# Patient Record
Sex: Female | Born: 1955 | Race: White | Hispanic: No | Marital: Married | Smoking: Never smoker
Health system: Southern US, Community
[De-identification: ages and names within clinical notes are randomized; demographics above are authoritative.]

## PROBLEM LIST (undated history)

## (undated) DIAGNOSIS — Z9889 Other specified postprocedural states: Secondary | ICD-10-CM

## (undated) DIAGNOSIS — C50919 Malignant neoplasm of unspecified site of unspecified female breast: Secondary | ICD-10-CM

## (undated) DIAGNOSIS — K219 Gastro-esophageal reflux disease without esophagitis: Secondary | ICD-10-CM

## (undated) DIAGNOSIS — Z8489 Family history of other specified conditions: Secondary | ICD-10-CM

## (undated) DIAGNOSIS — Z973 Presence of spectacles and contact lenses: Secondary | ICD-10-CM

## (undated) DIAGNOSIS — J45909 Unspecified asthma, uncomplicated: Secondary | ICD-10-CM

## (undated) DIAGNOSIS — S82899A Other fracture of unspecified lower leg, initial encounter for closed fracture: Secondary | ICD-10-CM

## (undated) DIAGNOSIS — R112 Nausea with vomiting, unspecified: Secondary | ICD-10-CM

## (undated) DIAGNOSIS — T7840XA Allergy, unspecified, initial encounter: Secondary | ICD-10-CM

## (undated) DIAGNOSIS — M1990 Unspecified osteoarthritis, unspecified site: Secondary | ICD-10-CM

## (undated) DIAGNOSIS — M810 Age-related osteoporosis without current pathological fracture: Secondary | ICD-10-CM

## (undated) DIAGNOSIS — L0390 Cellulitis, unspecified: Secondary | ICD-10-CM

## (undated) HISTORY — DX: Age-related osteoporosis without current pathological fracture: M81.0

## (undated) HISTORY — PX: TONSILLECTOMY: SUR1361

## (undated) HISTORY — PX: ABDOMINAL HYSTERECTOMY: SHX81

## (undated) HISTORY — DX: Malignant neoplasm of unspecified site of unspecified female breast: C50.919

## (undated) HISTORY — DX: Unspecified asthma, uncomplicated: J45.909

## (undated) HISTORY — PX: TUBAL LIGATION: SHX77

## (undated) HISTORY — DX: Allergy, unspecified, initial encounter: T78.40XA

## (undated) HISTORY — PX: ROTATOR CUFF REPAIR: SHX139

---

## 1997-12-05 ENCOUNTER — Other Ambulatory Visit: Admission: RE | Admit: 1997-12-05 | Discharge: 1997-12-05 | Payer: Self-pay | Admitting: Obstetrics and Gynecology

## 1999-01-23 ENCOUNTER — Other Ambulatory Visit: Admission: RE | Admit: 1999-01-23 | Discharge: 1999-01-23 | Payer: Self-pay | Admitting: Obstetrics and Gynecology

## 1999-04-16 ENCOUNTER — Other Ambulatory Visit: Admission: RE | Admit: 1999-04-16 | Discharge: 1999-04-16 | Payer: Self-pay | Admitting: Obstetrics and Gynecology

## 2000-11-03 ENCOUNTER — Other Ambulatory Visit: Admission: RE | Admit: 2000-11-03 | Discharge: 2000-11-03 | Payer: Self-pay | Admitting: Obstetrics and Gynecology

## 2002-11-17 ENCOUNTER — Ambulatory Visit (HOSPITAL_COMMUNITY): Admission: RE | Admit: 2002-11-17 | Discharge: 2002-11-17 | Payer: Self-pay | Admitting: Obstetrics and Gynecology

## 2002-11-17 ENCOUNTER — Encounter: Payer: Self-pay | Admitting: Obstetrics and Gynecology

## 2003-12-14 ENCOUNTER — Other Ambulatory Visit: Admission: RE | Admit: 2003-12-14 | Discharge: 2003-12-14 | Payer: Self-pay | Admitting: Obstetrics and Gynecology

## 2004-05-12 HISTORY — PX: COLONOSCOPY: SHX174

## 2004-06-12 ENCOUNTER — Ambulatory Visit (HOSPITAL_COMMUNITY): Admission: RE | Admit: 2004-06-12 | Discharge: 2004-06-12 | Payer: Self-pay | Admitting: Gastroenterology

## 2005-02-05 ENCOUNTER — Other Ambulatory Visit: Admission: RE | Admit: 2005-02-05 | Discharge: 2005-02-05 | Payer: Self-pay | Admitting: Obstetrics and Gynecology

## 2006-04-09 ENCOUNTER — Other Ambulatory Visit: Admission: RE | Admit: 2006-04-09 | Discharge: 2006-04-09 | Payer: Self-pay | Admitting: Obstetrics and Gynecology

## 2006-05-21 ENCOUNTER — Ambulatory Visit: Payer: Self-pay | Admitting: Oncology

## 2006-06-08 LAB — CBC & DIFF AND RETIC
BASO%: 1.1 % (ref 0.0–2.0)
Basophils Absolute: 0.1 10*3/uL (ref 0.0–0.1)
EOS%: 2.1 % (ref 0.0–7.0)
Eosinophils Absolute: 0.1 10*3/uL (ref 0.0–0.5)
HCT: 34.3 % — ABNORMAL LOW (ref 34.8–46.6)
HGB: 12.1 g/dL (ref 11.6–15.9)
IRF: 0.32 (ref 0.130–0.330)
RBC: 3.87 10*6/uL (ref 3.70–5.32)
Retic %: 1.8 % (ref 0.4–2.3)

## 2006-06-08 LAB — MORPHOLOGY

## 2006-06-08 LAB — URINALYSIS, MICROSCOPIC - CHCC
Bilirubin (Urine): NEGATIVE
Nitrite: NEGATIVE
Specific Gravity, Urine: 1.015 (ref 1.003–1.035)
WBC, UA: NEGATIVE (ref 0–2)
pH: 5 (ref 4.6–8.0)

## 2006-06-16 LAB — LUPUS ANTICOAGULANT PANEL: DRVVT: 45.8 secs — ABNORMAL HIGH (ref 31.9–44.2)

## 2006-06-16 LAB — CARDIOLIPIN ANTIBODIES, IGG, IGM, IGA: Anticardiolipin IgA: <7 [APL'U] (ref <13)

## 2006-06-16 LAB — BETA-2 GLYCOPROTEIN ANTIBODIES
Beta-2 Glyco I IgG: <4 U/mL (ref <20)
Beta-2-Glycoprotein I IgA: <4 U/mL (ref <10)
Beta-2-Glycoprotein I IgM: 13 U/mL (ref <10)

## 2006-06-16 LAB — LACTATE DEHYDROGENASE: LDH: 181 U/L (ref 94–250)

## 2006-06-16 LAB — IRON AND TIBC: TIBC: 335 ug/dL (ref 250–470)

## 2006-06-16 LAB — ANA: Anti Nuclear Antibody(ANA): NEGATIVE

## 2006-06-16 LAB — SEDIMENTATION RATE: Sed Rate: 12 mm/hr (ref 0–22)

## 2007-03-24 ENCOUNTER — Ambulatory Visit (HOSPITAL_BASED_OUTPATIENT_CLINIC_OR_DEPARTMENT_OTHER): Admission: RE | Admit: 2007-03-24 | Discharge: 2007-03-24 | Payer: Self-pay | Admitting: Orthopedic Surgery

## 2007-05-11 ENCOUNTER — Other Ambulatory Visit: Admission: RE | Admit: 2007-05-11 | Discharge: 2007-05-11 | Payer: Self-pay | Admitting: Obstetrics and Gynecology

## 2008-05-15 ENCOUNTER — Other Ambulatory Visit: Admission: RE | Admit: 2008-05-15 | Discharge: 2008-05-15 | Payer: Self-pay | Admitting: Obstetrics and Gynecology

## 2009-05-16 ENCOUNTER — Other Ambulatory Visit
Admission: RE | Admit: 2009-05-16 | Discharge: 2009-05-16 | Payer: Self-pay | Source: Home / Self Care | Admitting: Obstetrics and Gynecology

## 2010-04-25 ENCOUNTER — Ambulatory Visit (HOSPITAL_COMMUNITY)
Admission: RE | Admit: 2010-04-25 | Discharge: 2010-04-25 | Payer: Self-pay | Source: Home / Self Care | Attending: Surgery | Admitting: Surgery

## 2010-07-23 LAB — CBC
HCT: 38 % (ref 36.0–46.0)
Hemoglobin: 12.9 g/dL (ref 12.0–15.0)
MCHC: 33.9 g/dL (ref 30.0–36.0)
MCV: 88.2 fL (ref 78.0–100.0)
Platelets: 276 10*3/uL (ref 150–400)

## 2010-07-23 LAB — DIFFERENTIAL
Eosinophils Absolute: 0.1 10*3/uL (ref 0.0–0.7)
Eosinophils Relative: 2 % (ref 0–5)
Lymphs Abs: 1.6 10*3/uL (ref 0.7–4.0)
Monocytes Relative: 8 % (ref 3–12)

## 2010-07-23 LAB — COMPREHENSIVE METABOLIC PANEL
AST: 25 U/L (ref 0–37)
Albumin: 4.2 g/dL (ref 3.5–5.2)
Alkaline Phosphatase: 66 U/L (ref 39–117)
BUN: 11 mg/dL (ref 6–23)
Chloride: 104 mEq/L (ref 96–112)
Total Protein: 6.7 g/dL (ref 6.0–8.3)

## 2010-07-23 LAB — SURGICAL PCR SCREEN
MRSA, PCR: NEGATIVE
Staphylococcus aureus: NEGATIVE

## 2010-09-24 NOTE — Op Note (Signed)
Madison Bryant, Madison Bryant             ACCOUNT NO.:  [id number]   MEDICAL RECORD NO.:  [id number]          PATIENT TYPE:  AMB   LOCATION:  DSC                          FACILITY:  [facility]   PHYSICIAN:  Loreta Ave, M.D. DATE OF BIRTH:  07/09/1955   DATE OF PROCEDURE:  03/22/2007  DATE OF DISCHARGE:                               OPERATIVE REPORT   PREOPERATIVE DIAGNOSIS:  1. Right shoulder impingement distal clavicle osteolysis.  2. Full thickness tear rotator cuff.   POSTOPERATIVE DIAGNOSIS:  1. Right shoulder impingement distal clavicle osteolysis.  2. Full thickness tear rotator cuff.  3. Circumferential labral tearing, mostly anterior labrum.   PROCEDURE:  Right shoulder exam under anesthesia, arthroscopy with  debridement of labrum and rotator cuff.  Acromioplasty CA ligament  release.  Excision distal clavicle.  Arthroscopic assisted repair of the  cuff tear with a fiber weave, horizontal mattress suture and a simple  FiberWire suture all anchored with a bioabsorbable swivel lock anchor  out laterally.  Distal clavicle excision.   SURGEON:  Loreta Ave, MD   ASSISTANT:  Zonia Kief, PA.   ANESTHESIA:  General.   BLOOD LOSS:  Minimal.   SPECIMENS:  None.   CULTURES:  None.   COMPLICATIONS:  None.   DRESSING:  Soft compressive with shoulder immobilizer.   DESCRIPTION OF PROCEDURE:  The patient was brought to the operating  room, placed on the operating table in supine position.  After adequate  anesthesia had been obtained, placed in a beach-chair position in the  shoulder positioner, prepped and draped in the usual sterile fashion.  Three standard shoulder portals, anterior, posterior and lateral. The  shoulder entered with a blunt obturator, arthroscope introduced,  shoulder distended and inspected.  Full thickness tear supraspinatus  tendon anterior half crescent region.  The cable still anchored.  Reasonable tissue quality.  Debrided.  Biceps tendon and  biceps anchor  intact.  Complex tearing of the anterior labrum debrided.  Some grade 2,  mild grade 3 changes on the glenoid debrided.  Cannula redirected  subacromially.  Obvious impingement type 3 acromion.  Grade 4 changes AC  joint with periarticular spurs.  The cuff was mobilized and felt to be  very reparable.  Acromioplasty to a type 1 acromion with shaver and high-  speed bur.  CA ligament release cautery.  Distal clavicle exposed and a  lateral centimeter resected.  I then used the scorpion device to capture  the tear with a fiber weave suture in a horizontal mattress fashion.  A  single FiberWire suture was placed in the middle of this so I had three  anchoring points.  The tuberosity was rough and had bleeding bone.  A  punch hole was made into the tuberosity of the attachment site.  All of  the suture was then weaved through the swivel lock which was then firmly  hammered down into place in the humerus and then screwed down to further  ensure repair and reattachment of the cuff.  The sutures were cut off,  the insertion device removed.  Overall construct examined.  I had a  nice  firm watertight closure with appropriate tension of the cuff back to its  attachment site.  Adequate decompression confirmed view from all  portals.  Instruments and fluid removed.  Portals were then all closed  with nylon.  Sterile compressive dressing applied.  Shoulder mobilizer  applied.  Anesthesia reversed.  Brought to the recovery room.  Tolerated  surgery well, no complications.      Loreta Ave, M.D.  Electronically Signed     DFM/MEDQ  D:  03/24/2007  T:  03/25/2007  Job:  [id number]

## 2010-09-27 NOTE — Op Note (Signed)
NAMECHRISIE, JANKOVICH NO.:  [id number]   MEDICAL RECORD NO.:  [id number]          PATIENT TYPE:  AMB   LOCATION:  ENDO                         FACILITY:  [facility]   PHYSICIAN:  Danise Edge, M.D.   DATE OF BIRTH:  20-Sep-1955   DATE OF PROCEDURE:  06/12/2004  DATE OF DISCHARGE:                                 OPERATIVE REPORT   PROCEDURE INDICATION:  Ms. Daney Moor is a 55 year old female born on  [birth year].  Ms. Maldonado is undergoing diagnostic colonoscopy to evaluate  guaiac-positive stool.   ENDOSCOPIST:  Danise Edge, M.D.   PREMEDICATION:  Versed 10 mg and Demerol 50 mg.   DESCRIPTION OF PROCEDURE:  After obtaining informed consent, Ms. Rini was  placed in the left lateral decubitus position.  I administered intravenous  Demerol and intravenous Versed to achieve conscious sedation for the  procedure.  The patient's blood pressure, oxygen saturation and cardiac  rhythm were monitored throughout the procedure and documented in the medical  record.   Anal inspection and digital rectal exam were normal.  The Olympus adjustable  pediatric colonoscope was introduced into the rectum and advanced through  the cecum.  Colonic preparation for the exam today was excellent.   Rectal normal.   Sigmoid colon and descending colon normal.   Splenic flexure normal.   Transverse colon normal.   Ascending colon normal.   Cecum and ileocecal valve normal.   ASSESSMENT:  Normal diagnostic proctocolonoscopy to the cecum.      MJ/MEDQ  D:  06/12/2004  T:  06/12/2004  Job:  [id number]   cc:   Artist Pais, M.D.  [address], [address]  [place]  [place] [address]  Fax: [phone]

## 2011-02-18 LAB — POCT HEMOGLOBIN-HEMACUE: Operator id: 116011

## 2011-11-18 ENCOUNTER — Other Ambulatory Visit: Payer: Self-pay | Admitting: Orthopedic Surgery

## 2011-11-18 DIAGNOSIS — M25529 Pain in unspecified elbow: Secondary | ICD-10-CM

## 2011-11-27 ENCOUNTER — Ambulatory Visit
Admission: RE | Admit: 2011-11-27 | Discharge: 2011-11-27 | Disposition: A | Discharge status: Home / Self Care | Payer: BC Managed Care – PPO | Source: Ambulatory Visit | Attending: Orthopedic Surgery | Admitting: Orthopedic Surgery

## 2011-11-27 DIAGNOSIS — M25529 Pain in unspecified elbow: Secondary | ICD-10-CM

## 2011-11-27 MED ORDER — IOHEXOL 180 MG/ML  SOLN
8.0000 mL | Freq: Once | INTRAMUSCULAR | Status: AC | PRN
  Administered 2011-11-27: 8 mL via INTRA_ARTICULAR

## 2014-03-15 ENCOUNTER — Encounter: Payer: Self-pay | Admitting: *Deleted

## 2014-06-08 ENCOUNTER — Encounter: Payer: Self-pay | Admitting: Family Medicine

## 2014-07-24 DIAGNOSIS — L309 Dermatitis, unspecified: Secondary | ICD-10-CM | POA: Insufficient documentation

## 2015-03-22 ENCOUNTER — Ambulatory Visit
Admission: RE | Admit: 2015-03-22 | Discharge: 2015-03-22 | Disposition: A | Discharge status: Home / Self Care | Payer: BC Managed Care – PPO | Source: Ambulatory Visit | Attending: Nurse Practitioner | Admitting: Nurse Practitioner

## 2015-03-22 ENCOUNTER — Other Ambulatory Visit: Payer: Self-pay | Admitting: Nurse Practitioner

## 2015-03-22 DIAGNOSIS — M79672 Pain in left foot: Secondary | ICD-10-CM

## 2016-08-20 ENCOUNTER — Encounter (HOSPITAL_BASED_OUTPATIENT_CLINIC_OR_DEPARTMENT_OTHER): Payer: Self-pay | Admitting: *Deleted

## 2016-08-20 NOTE — H&P (Signed)
Madison Bryant comes in today with a new injury to the left ankle. Vertical load external rotation injury to the left ankle when she slipped and fell this past weekend.    She was seen at the urgent care. She had a long spiral fracture of the distal fibula. She was placed in a splint, nonweightbearing.  She comes in for evaluation and recommendations. She has pain both medial and lateral. I reviewed the urgent care notes as well as her history. The films reveal a long spiral fracture of the fibula. This was a little displaced down near the mortise. The mortise was not wide the syndesmosis is intact. Remaining history and general exam is reviewed.  EXAMINATION: Well developed, well nourished female in no acute distress.  Alert and oriented x3.  Lungs clear to auscultation bilaterally.  Hear sounds normal.  We removed her splint. She is obviously very tender over the fracture. She is also tender, swollen and bruised over the deltoid ligament. Neurovascularly intact.   PLAN:  This essentially is a functional bimalleolar fracture and given the soft tissue injury medially as well as the potential for shortening and displacement of the fibula I think this is best managed with operative intervention.  I discussed this with her. A new well-padded splint was applied. Elevate for swelling. Non-weightbearing. Plan for examination under anesthesia with external rotation views. Open reduction internal fixation of her fibula with either interfragmentary screws or a bridging plate or both. Closed treatment of her deltoid ligament injury. What is involved  intraoperative and post operative reviewed.  Tentatively for now I am writing her out of work for six weeks but this could be up to twelve weeks depending on if light duties are available. She will look into a knee walker. We completed all paperwork and answered all questions. I will see her at the time of operative intervention.

## 2016-08-21 ENCOUNTER — Encounter (HOSPITAL_BASED_OUTPATIENT_CLINIC_OR_DEPARTMENT_OTHER): Payer: Self-pay

## 2016-08-21 ENCOUNTER — Encounter (HOSPITAL_BASED_OUTPATIENT_CLINIC_OR_DEPARTMENT_OTHER)
Admission: RE | Disposition: A | Discharge status: Home / Self Care | Payer: Self-pay | Source: Ambulatory Visit | Attending: Orthopedic Surgery

## 2016-08-21 ENCOUNTER — Ambulatory Visit (HOSPITAL_BASED_OUTPATIENT_CLINIC_OR_DEPARTMENT_OTHER)
Admission: RE | Admit: 2016-08-21 | Discharge: 2016-08-21 | Disposition: A | Discharge status: Home / Self Care | Payer: BC Managed Care – PPO | Source: Ambulatory Visit | Attending: Orthopedic Surgery | Admitting: Orthopedic Surgery

## 2016-08-21 ENCOUNTER — Ambulatory Visit (HOSPITAL_BASED_OUTPATIENT_CLINIC_OR_DEPARTMENT_OTHER): Payer: BC Managed Care – PPO | Admitting: Anesthesiology

## 2016-08-21 DIAGNOSIS — S93422A Sprain of deltoid ligament of left ankle, initial encounter: Secondary | ICD-10-CM | POA: Insufficient documentation

## 2016-08-21 DIAGNOSIS — S82832A Other fracture of upper and lower end of left fibula, initial encounter for closed fracture: Secondary | ICD-10-CM | POA: Insufficient documentation

## 2016-08-21 DIAGNOSIS — W010XXA Fall on same level from slipping, tripping and stumbling without subsequent striking against object, initial encounter: Secondary | ICD-10-CM | POA: Insufficient documentation

## 2016-08-21 DIAGNOSIS — S82892A Other fracture of left lower leg, initial encounter for closed fracture: Secondary | ICD-10-CM | POA: Diagnosis not present

## 2016-08-21 HISTORY — DX: Gastro-esophageal reflux disease without esophagitis: K21.9

## 2016-08-21 HISTORY — DX: Unspecified osteoarthritis, unspecified site: M19.90

## 2016-08-21 HISTORY — DX: Other fracture of unspecified lower leg, initial encounter for closed fracture: S82.899A

## 2016-08-21 HISTORY — PX: ORIF FIBULA FRACTURE: SHX5114

## 2016-08-21 SURGERY — OPEN REDUCTION INTERNAL FIXATION (ORIF) FIBULA FRACTURE
Anesthesia: General | Site: Leg Lower | Laterality: Left

## 2016-08-21 MED ORDER — ONDANSETRON HCL 4 MG/2ML IJ SOLN
INTRAMUSCULAR | Status: AC
  Filled 2016-08-21: qty 2

## 2016-08-21 MED ORDER — LIDOCAINE 2% (20 MG/ML) 5 ML SYRINGE
INTRAMUSCULAR | Status: AC
  Filled 2016-08-21: qty 5

## 2016-08-21 MED ORDER — ONDANSETRON HCL 4 MG/2ML IJ SOLN
INTRAMUSCULAR | Status: DC | PRN
  Administered 2016-08-21: 4 mg via INTRAVENOUS

## 2016-08-21 MED ORDER — MIDAZOLAM HCL 2 MG/2ML IJ SOLN
INTRAMUSCULAR | Status: AC
  Filled 2016-08-21: qty 2

## 2016-08-21 MED ORDER — OXYCODONE-ACETAMINOPHEN 5-325 MG PO TABS
ORAL_TABLET | ORAL | Status: AC
  Filled 2016-08-21: qty 1

## 2016-08-21 MED ORDER — DEXAMETHASONE SODIUM PHOSPHATE 4 MG/ML IJ SOLN
INTRAMUSCULAR | Status: DC | PRN
  Administered 2016-08-21: 10 mg via INTRAVENOUS

## 2016-08-21 MED ORDER — CEFAZOLIN SODIUM-DEXTROSE 2-4 GM/100ML-% IV SOLN
2.0000 g | INTRAVENOUS | Status: AC
  Administered 2016-08-21: 2 g via INTRAVENOUS

## 2016-08-21 MED ORDER — MIDAZOLAM HCL 2 MG/2ML IJ SOLN
1.0000 mg | INTRAMUSCULAR | Status: DC | PRN
  Administered 2016-08-21: 1.5 mg via INTRAVENOUS
  Administered 2016-08-21: 1 mg via INTRAVENOUS

## 2016-08-21 MED ORDER — OXYCODONE-ACETAMINOPHEN 5-325 MG PO TABS: 1.0000 | ORAL_TABLET | ORAL | Status: DC | PRN

## 2016-08-21 MED ORDER — FENTANYL CITRATE (PF) 100 MCG/2ML IJ SOLN
INTRAMUSCULAR | Status: AC
  Filled 2016-08-21: qty 2

## 2016-08-21 MED ORDER — PROMETHAZINE HCL 25 MG/ML IJ SOLN: 6.2500 mg | INTRAMUSCULAR | Status: DC | PRN

## 2016-08-21 MED ORDER — PROPOFOL 10 MG/ML IV BOLUS
INTRAVENOUS | Status: DC | PRN
  Administered 2016-08-21: 180 mg via INTRAVENOUS

## 2016-08-21 MED ORDER — LACTATED RINGERS IV SOLN
INTRAVENOUS | Status: DC
  Administered 2016-08-21: 08:00:00 via INTRAVENOUS

## 2016-08-21 MED ORDER — BUPIVACAINE-EPINEPHRINE (PF) 0.5% -1:200000 IJ SOLN
INTRAMUSCULAR | Status: DC | PRN
  Administered 2016-08-21: 25 mL via PERINEURAL

## 2016-08-21 MED ORDER — SCOPOLAMINE 1 MG/3DAYS TD PT72: 1.0000 | MEDICATED_PATCH | Freq: Once | TRANSDERMAL | Status: DC | PRN

## 2016-08-21 MED ORDER — FENTANYL CITRATE (PF) 100 MCG/2ML IJ SOLN
50.0000 ug | INTRAMUSCULAR | Status: DC | PRN
  Administered 2016-08-21 (×2): 50 ug via INTRAVENOUS

## 2016-08-21 MED ORDER — CHLORHEXIDINE GLUCONATE 4 % EX LIQD: 60.0000 mL | Freq: Once | CUTANEOUS | Status: DC

## 2016-08-21 MED ORDER — ONDANSETRON HCL 4 MG/2ML IJ SOLN: 4.0000 mg | Freq: Four times a day (QID) | INTRAMUSCULAR | Status: DC | PRN

## 2016-08-21 MED ORDER — LIDOCAINE 2% (20 MG/ML) 5 ML SYRINGE
INTRAMUSCULAR | Status: DC | PRN
  Administered 2016-08-21: 80 mg via INTRAVENOUS

## 2016-08-21 MED ORDER — LACTATED RINGERS IV SOLN
INTRAVENOUS | Status: DC
  Administered 2016-08-21: 09:00:00 via INTRAVENOUS

## 2016-08-21 MED ORDER — HYDROMORPHONE HCL 1 MG/ML IJ SOLN: 0.5000 mg | INTRAMUSCULAR | Status: DC | PRN

## 2016-08-21 MED ORDER — OXYCODONE-ACETAMINOPHEN 5-325 MG PO TABS
1.0000 | ORAL_TABLET | Freq: Once | ORAL | Status: AC
  Administered 2016-08-21: 1 via ORAL

## 2016-08-21 MED ORDER — METHOCARBAMOL 1000 MG/10ML IJ SOLN: 500.0000 mg | Freq: Four times a day (QID) | INTRAVENOUS | Status: DC | PRN

## 2016-08-21 MED ORDER — DEXAMETHASONE SODIUM PHOSPHATE 10 MG/ML IJ SOLN
INTRAMUSCULAR | Status: AC
  Filled 2016-08-21: qty 1

## 2016-08-21 MED ORDER — METOCLOPRAMIDE HCL 5 MG PO TABS: 5.0000 mg | ORAL_TABLET | Freq: Three times a day (TID) | ORAL | Status: DC | PRN

## 2016-08-21 MED ORDER — METHOCARBAMOL 500 MG PO TABS: 500.0000 mg | ORAL_TABLET | Freq: Four times a day (QID) | ORAL | Status: DC | PRN

## 2016-08-21 MED ORDER — METOCLOPRAMIDE HCL 5 MG/ML IJ SOLN: 5.0000 mg | Freq: Three times a day (TID) | INTRAMUSCULAR | Status: DC | PRN

## 2016-08-21 MED ORDER — ONDANSETRON HCL 4 MG PO TABS: 4.0000 mg | ORAL_TABLET | Freq: Four times a day (QID) | ORAL | Status: DC | PRN

## 2016-08-21 MED ORDER — HYDROMORPHONE HCL 1 MG/ML IJ SOLN: 0.2500 mg | INTRAMUSCULAR | Status: DC | PRN

## 2016-08-21 MED ORDER — CEFAZOLIN SODIUM-DEXTROSE 2-4 GM/100ML-% IV SOLN
INTRAVENOUS | Status: AC
  Filled 2016-08-21: qty 100

## 2016-08-21 SURGICAL SUPPLY — 74 items
APL SKNCLS STERI-STRIP NONHPOA (GAUZE/BANDAGES/DRESSINGS)
BANDAGE ACE 4X5 VEL STRL LF (GAUZE/BANDAGES/DRESSINGS) IMPLANT
BANDAGE ACE 6X5 VEL STRL LF (GAUZE/BANDAGES/DRESSINGS) ×3 IMPLANT
BANDAGE ESMARK 6X9 LF (GAUZE/BANDAGES/DRESSINGS) ×1 IMPLANT
BENZOIN TINCTURE PRP APPL 2/3 (GAUZE/BANDAGES/DRESSINGS) IMPLANT
BLADE SURG 15 STRL LF DISP TIS (BLADE) ×1 IMPLANT
BLADE SURG 15 STRL SS (BLADE) ×3
BNDG CMPR 9X6 STRL LF SNTH (GAUZE/BANDAGES/DRESSINGS) ×1
BNDG COHESIVE 4X5 TAN STRL (GAUZE/BANDAGES/DRESSINGS) ×3 IMPLANT
BNDG ESMARK 6X9 LF (GAUZE/BANDAGES/DRESSINGS) ×3
CANISTER SUCT 1200ML W/VALVE (MISCELLANEOUS) ×3 IMPLANT
CLOSURE WOUND 1/2 X4 (GAUZE/BANDAGES/DRESSINGS)
COVER BACK TABLE 60X90IN (DRAPES) ×3 IMPLANT
COVER MAYO STAND STRL (DRAPES) ×3 IMPLANT
CUFF TOURNIQUET SINGLE 34IN LL (TOURNIQUET CUFF) IMPLANT
DECANTER SPIKE VIAL GLASS SM (MISCELLANEOUS) IMPLANT
DRAPE EXTREMITY T 121X128X90 (DRAPE) ×3 IMPLANT
DRAPE IMP U-DRAPE 54X76 (DRAPES) ×3 IMPLANT
DRAPE OEC MINIVIEW 54X84 (DRAPES) ×3 IMPLANT
DRAPE U-SHAPE 47X51 STRL (DRAPES) ×3 IMPLANT
DRILL 2.6X122MM WL AO SHAFT (BIT) ×2 IMPLANT
DRSG PAD ABDOMINAL 8X10 ST (GAUZE/BANDAGES/DRESSINGS) ×3 IMPLANT
DURAPREP 26ML APPLICATOR (WOUND CARE) ×3 IMPLANT
ELECT REM PT RETURN 9FT ADLT (ELECTROSURGICAL) ×3
ELECTRODE REM PT RTRN 9FT ADLT (ELECTROSURGICAL) ×1 IMPLANT
GAUZE SPONGE 4X4 12PLY STRL (GAUZE/BANDAGES/DRESSINGS) ×3 IMPLANT
GAUZE XEROFORM 1X8 LF (GAUZE/BANDAGES/DRESSINGS) ×3 IMPLANT
GLOVE BIO SURGEON STRL SZ 6.5 (GLOVE) ×1 IMPLANT
GLOVE BIO SURGEONS STRL SZ 6.5 (GLOVE) ×1
GLOVE BIOGEL PI IND STRL 7.0 (GLOVE) ×1 IMPLANT
GLOVE BIOGEL PI IND STRL 8 (GLOVE) ×1 IMPLANT
GLOVE BIOGEL PI INDICATOR 7.0 (GLOVE) ×6
GLOVE BIOGEL PI INDICATOR 8 (GLOVE) ×2
GLOVE ECLIPSE 7.0 STRL STRAW (GLOVE) ×3 IMPLANT
GLOVE SURG ORTHO 8.0 STRL STRW (GLOVE) ×3 IMPLANT
GOWN STRL REUS W/ TWL LRG LVL3 (GOWN DISPOSABLE) ×1 IMPLANT
GOWN STRL REUS W/ TWL XL LVL3 (GOWN DISPOSABLE) ×2 IMPLANT
GOWN STRL REUS W/TWL LRG LVL3 (GOWN DISPOSABLE) ×3
GOWN STRL REUS W/TWL XL LVL3 (GOWN DISPOSABLE) ×9 IMPLANT
NDL HYPO 25X1 1.5 SAFETY (NEEDLE) IMPLANT
NEEDLE HYPO 25X1 1.5 SAFETY (NEEDLE) IMPLANT
NS IRRIG 1000ML POUR BTL (IV SOLUTION) ×3 IMPLANT
PACK BASIN DAY SURGERY FS (CUSTOM PROCEDURE TRAY) ×3 IMPLANT
PAD CAST 4YDX4 CTTN HI CHSV (CAST SUPPLIES) ×2 IMPLANT
PADDING CAST COTTON 4X4 STRL (CAST SUPPLIES) ×6
PENCIL BUTTON HOLSTER BLD 10FT (ELECTRODE) ×3 IMPLANT
PLATE DISTAL FIBULA 6HOLD ANKL (Plate) ×2 IMPLANT
SCREW 3.5X10MM (Screw) ×2 IMPLANT
SCREW BONE 14MMX3.5MM (Screw) ×2 IMPLANT
SCREW BONE 18 (Screw) ×6 IMPLANT
SCREW BONE 3.5X16MM (Screw) ×2 IMPLANT
SCREW BONE NON-LCKING 3.5X12MM (Screw) ×6 IMPLANT
SCREW LOCK 3.5X14 (Screw) ×2 IMPLANT
SLEEVE SCD COMPRESS KNEE MED (MISCELLANEOUS) IMPLANT
SPLINT FAST PLASTER 5X30 (CAST SUPPLIES)
SPLINT PLASTER CAST FAST 5X30 (CAST SUPPLIES) IMPLANT
SPONGE LAP 4X18 X RAY DECT (DISPOSABLE) ×3 IMPLANT
STAPLER VISISTAT 35W (STAPLE) IMPLANT
STOCKINETTE 4X48 STRL (DRAPES) ×3 IMPLANT
STRIP CLOSURE SKIN 1/2X4 (GAUZE/BANDAGES/DRESSINGS) IMPLANT
SUCTION FRAZIER HANDLE 10FR (MISCELLANEOUS)
SUCTION TUBE FRAZIER 10FR DISP (MISCELLANEOUS) IMPLANT
SUT ETHILON 3 0 PS 1 (SUTURE) IMPLANT
SUT VIC AB 0 CT1 27 (SUTURE) ×3
SUT VIC AB 0 CT1 27XBRD ANBCTR (SUTURE) ×1 IMPLANT
SUT VIC AB 2-0 SH 27 (SUTURE)
SUT VIC AB 2-0 SH 27XBRD (SUTURE) IMPLANT
SUT VICRYL 4-0 PS2 18IN ABS (SUTURE) IMPLANT
SYR BULB 3OZ (MISCELLANEOUS) ×3 IMPLANT
SYR CONTROL 10ML LL (SYRINGE) ×3 IMPLANT
TUBE CONNECTING 20'X1/4 (TUBING) ×1
TUBE CONNECTING 20X1/4 (TUBING) ×2 IMPLANT
UNDERPAD 30X30 (UNDERPADS AND DIAPERS) ×3 IMPLANT
YANKAUER SUCT BULB TIP NO VENT (SUCTIONS) ×3 IMPLANT

## 2016-08-21 NOTE — Anesthesia Procedure Notes (Signed)
Procedure Name: LMA Insertion Date/Time: 08/21/2016 8:50 AM Performed by: Lyndee Leo Pre-anesthesia Checklist: Patient identified, Emergency Drugs available, Suction available and Patient being monitored Patient Re-evaluated:Patient Re-evaluated prior to inductionOxygen Delivery Method: Circle system utilized Preoxygenation: Pre-oxygenation with 100% oxygen Intubation Type: IV induction Ventilation: Mask ventilation without difficulty LMA: LMA inserted LMA Size: 3.0 Number of attempts: 1 Airway Equipment and Method: Bite block Placement Confirmation: positive ETCO2 Tube secured with: Tape Dental Injury: Teeth and Oropharynx as per pre-operative assessment

## 2016-08-21 NOTE — Transfer of Care (Signed)
Immediate Anesthesia Transfer of Care Note  Patient: Madison Bryant  Procedure(s) Performed: Procedure(s): OPEN REDUCTION INTERNAL FIXATION (ORIF) DISTAL FIBULA FRACTURE (Left)  Patient Location: PACU  Anesthesia Type:GA combined with regional for post-op pain  Level of Consciousness: awake, sedated and patient cooperative  Airway & Oxygen Therapy: Patient Spontanous Breathing and Patient connected to face mask oxygen  Post-op Assessment: Report given to RN and Post -op Vital signs reviewed and stable  Post vital signs: Reviewed and stable  Last Vitals:  Vitals:   08/21/16 0840 08/21/16 0958  BP:  120/62  Pulse: 84 90  Resp: 16 (!) 9  Temp:      Last Pain:  Vitals:   08/21/16 0752  TempSrc: Oral         Complications: No apparent anesthesia complications

## 2016-08-21 NOTE — Anesthesia Postprocedure Evaluation (Addendum)
Anesthesia Post Note  Patient: Madison Bryant  Procedure(s) Performed: Procedure(s) (LRB): OPEN REDUCTION INTERNAL FIXATION (ORIF) DISTAL FIBULA FRACTURE (Left)  Patient location during evaluation: PACU Anesthesia Type: General and Regional Level of consciousness: awake and alert Pain management: pain level controlled Vital Signs Assessment: post-procedure vital signs reviewed and stable Respiratory status: spontaneous breathing, nonlabored ventilation, respiratory function stable and patient connected to nasal cannula oxygen Cardiovascular status: blood pressure returned to baseline and stable Postop Assessment: no signs of nausea or vomiting Anesthetic complications: no       Last Vitals:  Vitals:   08/21/16 1030 08/21/16 1045  BP: 114/68 116/73  Pulse: 75 78  Resp: 12 12  Temp:      Last Pain:  Vitals:   08/21/16 1030  TempSrc:   PainSc: 0-No pain                 Aleria Maheu,JAMES TERRILL

## 2016-08-21 NOTE — Interval H&P Note (Signed)
History and Physical Interval Note:  08/21/2016 7:32 AM  Madison Bryant  has presented today for surgery, with the diagnosis of LEFT ANKLE FRACTURE  The various methods of treatment have been discussed with the patient and family. After consideration of risks, benefits and other options for treatment, the patient has consented to  Procedure(s): OPEN REDUCTION INTERNAL FIXATION (ORIF) DISTAL FIBULA FRACTURE (Left) as a surgical intervention .  The patient's history has been reviewed, patient examined, no change in status, stable for surgery.  I have reviewed the patient's chart and labs.  Questions were answered to the patient's satisfaction.     Madison Bryant

## 2016-08-21 NOTE — Discharge Instructions (Signed)
Post Anesthesia Home Care Instructions  Activity: Get plenty of rest for the remainder of the day. A responsible individual must stay with you for 24 hours following the procedure.  For the next 24 hours, DO NOT: -Drive a car -Paediatric nurse -Drink alcoholic beverages -Take any medication unless instructed by your physician -Make any legal decisions or sign important papers.  Meals: Start with liquid foods such as gelatin or soup. Progress to regular foods as tolerated. Avoid greasy, spicy, heavy foods. If nausea and/or vomiting occur, drink only clear liquids until the nausea and/or vomiting subsides. Call your physician if vomiting continues.  Special Instructions/Symptoms: Your throat may feel dry or sore from the anesthesia or the breathing tube placed in your throat during surgery. If this causes discomfort, gargle with warm salt water. The discomfort should disappear within 24 hours.  If you had a scopolamine patch placed behind your ear for the management of post- operative nausea and/or vomiting:  1. The medication in the patch is effective for 72 hours, after which it should be removed.  Wrap patch in a tissue and discard in the trash. Wash hands thoroughly with soap and water. 2. You may remove the patch earlier than 72 hours if you experience unpleasant side effects which may include dry mouth, dizziness or visual disturbances. 3. Avoid touching the patch. Wash your hands with soap and water after contact with the patch.      Regional Anesthesia Blocks  1. Numbness or the inability to move the "blocked" extremity may last from 3-48 hours after placement. The length of time depends on the medication injected and your individual response to the medication. If the numbness is not going away after 48 hours, call your surgeon.  2. The extremity that is blocked will need to be protected until the numbness is gone and the  Strength has returned. Because you cannot feel it, you  will need to take extra care to avoid injury. Because it may be weak, you may have difficulty moving it or using it. You may not know what position it is in without looking at it while the block is in effect.  3. For blocks in the legs and feet, returning to weight bearing and walking needs to be done carefully. You will need to wait until the numbness is entirely gone and the strength has returned. You should be able to move your leg and foot normally before you try and bear weight or walk. You will need someone to be with you when you first try to ensure you do not fall and possibly risk injury.  4. Bruising and tenderness at the needle site are common side effects and will resolve in a few days.  5. Persistent numbness or new problems with movement should be communicated to the surgeon or the [place] [phone] [place] [phone]).    Ankle Fracture Care After Refer to this sheet in the next few weeks. These discharge instructions provide you with general information on caring for yourself after you leave the hospital. Your caregiver may also give you specific instructions. Your treatment has been planned according to the most current medical practices available, but unavoidable complications sometimes occur. If you have any problems or questions after discharge, please call your caregiver. HOME INSTRUCTIONS You may resume a normal diet and activities as directed. Walk with crutches NON WEIGHT BEARING Do NOT get cast wet.  Do NOT remove dressing until you come back to the doctor  Only take over-the-counter or prescription medicines for pain, discomfort, or fever as directed by your caregiver.  Eat a well-balanced diet.  Avoid lifting or driving until you are instructed otherwise.  Make an appointment to see your caregiver for stitches (suture) or staple removal as directed.   SEEK MEDICAL CARE IF: You have swelling of your calf or leg.  You develop  shortness of breath or chest pain.  You have redness, swelling, or increasing pain in the wound.  There is pus or any unusual drainage coming from the surgical site.  You notice a bad smell coming from the surgical site or dressing.  The surgical site breaks open after sutures or staples have been removed.  There is persistent bleeding from the suture or staple line.  You are getting worse or are not improving.  You have any other questions or concerns.  SEEK IMMEDIATE MEDICAL CARE IF:  You have a fever.  You develop a rash.  You have difficulty breathing.  You develop any reaction or side effects to medicines given.  Your knee motion is decreasing rather than improving.  MAKE SURE YOU:  Understand these instructions.  Will watch your condition.  Will get help right away if you are not doing well or get worse.

## 2016-08-21 NOTE — Anesthesia Procedure Notes (Addendum)
Anesthesia Regional Block: Popliteal block   Pre-Anesthetic Checklist: ,, timeout performed, Correct Patient, Correct Site, Correct Laterality, Correct Procedure, Correct Position, site marked, Risks and benefits discussed,  Surgical consent,  Pre-op evaluation,  At surgeon's request and post-op pain management  Laterality: Left and Lower  Prep: chloraprep       Needles:   Needle Type: Echogenic Stimulator Needle     Needle Length: 9cm  Needle Gauge: 21   Needle insertion depth: 6 cm   Additional Needles:   Procedures: ultrasound guided,,,,,,,,  Narrative:  Start time: 08/21/2016 8:05 AM End time: 08/21/2016 8:38 AM Injection made incrementally with aspirations every 5 mL.  Performed by: Personally  Anesthesiologist: Joaquim Tolen

## 2016-08-21 NOTE — Anesthesia Preprocedure Evaluation (Addendum)
Anesthesia Evaluation  Patient identified by MRN, date of birth, ID band Patient awake    Reviewed: Allergy & Precautions, NPO status , Patient's Chart, lab work & pertinent test results  Airway Mallampati: III  TM Distance: <3 FB Neck ROM: Full  Mouth opening: Limited Mouth Opening  Dental no notable dental hx. (+) Teeth Intact   Pulmonary asthma ,    breath sounds clear to auscultation       Cardiovascular negative cardio ROS   Rhythm:Regular     Neuro/Psych negative neurological ROS  negative psych ROS   GI/Hepatic negative GI ROS, Neg liver ROS, GERD  ,  Endo/Other  negative endocrine ROS  Renal/GU negative Renal ROS  negative genitourinary   Musculoskeletal negative musculoskeletal ROS (+) Arthritis ,   Abdominal   Peds negative pediatric ROS (+)  Hematology negative hematology ROS (+)   Anesthesia Other Findings   Reproductive/Obstetrics negative OB ROS                            Anesthesia Physical Anesthesia Plan  ASA: II  Anesthesia Plan: General   Post-op Pain Management:  Regional for Post-op pain   Induction: Intravenous  Airway Management Planned: LMA  Additional Equipment:   Intra-op Plan:   Post-operative Plan: Extubation in OR  Informed Consent:   Dental advisory given  Plan Discussed with: CRNA  Anesthesia Plan Comments:        Anesthesia Quick Evaluation

## 2016-08-21 NOTE — Progress Notes (Signed)
Assisted Dr. Massagee with left, ultrasound guided, popliteal block. Side rails up, monitors on throughout procedure. See vital signs in flow sheet. Tolerated Procedure well. 

## 2016-08-22 ENCOUNTER — Encounter (HOSPITAL_BASED_OUTPATIENT_CLINIC_OR_DEPARTMENT_OTHER): Payer: Self-pay | Admitting: Orthopedic Surgery

## 2016-08-22 NOTE — Op Note (Signed)
NAME:  , PHILLIPS                        ACCOUNT NO.:  MEDICAL RECORD NO.:  [id number]  LOCATION:                                 FACILITY:  PHYSICIAN:  Ninetta Lights, M.D.      DATE OF BIRTH:  DATE OF PROCEDURE:  08/21/2016 DATE OF DISCHARGE:                              OPERATIVE REPORT   PREOPERATIVE DIAGNOSES:  Closed displaced distal fibular fracture, left ankle, long spiral fracture.  Torn deltoid ligament.  Lateral subluxation, mortise.  POSTOPERATIVE DIAGNOSES:  Closed displaced distal fibular fracture, left ankle, long spiral fracture.  Torn deltoid ligament.  Lateral subluxation, mortise.  PROCEDURE:  Left ankle reduction of mortise.  Open reduction and internal fixation of fibular fracture with a [employer] titanium low- profile plate and screws.  Ten proximal nonlocking screws.  One distal locking screw.  SURGEON:  Ninetta Lights, M.D.  ASSISTANT:  Lanier Clam, PA.  Presented throughout the entire case and necessary for timely completion of procedure.  ANESTHESIA:  General.  BLOOD LOSS:  Minimal.  SPECIMENS:  None.  CULTURES:  None.  COMPLICATIONS:  None.  DRESSINGS:  Soft compressive well-padded short-leg splint.  TOURNIQUET TIME:  40 minutes.  DESCRIPTION OF PROCEDURE:  Th patient was brought to the operating room and after adequate anesthesia had been obtained, a tourniquet applied to thigh.  Prepped and draped in usual sterile fashion.  Exsanguinated with elevation of [place].  Tourniquet inflated to 350 mmHg.  External rotation, stress view with fluoroscopic guidance.  Long spiral fracture of the fibula.  Syndesmosis intact.  Medial side open, but I could reduce this anatomically.  Longitudinal incision over the fibula. Subperiosteal exposure.  Anatomic reduction.  Fixed with a long bridging plate going to the top of the spiral and then well down into the lateral malleolus.  I utilized nonlocking screws throughout except most  distally where I had unicortical locking screw.  At completion, nice solid stable fixation, anatomic alignment of the fracture of the mortise.  I could bring it through passive motion without anything moving. Wound irrigated.  Closed in subcutaneous and subcuticular manner. Sterile compressive dressing applied.  Well-padded short-leg splint. Tourniquet deflated and removed.  Anesthesia reversed.  Brought to the recovery room.  Tolerated the surgery well.  No complications.     Ninetta Lights, M.D.     DFM/MEDQ  D:  08/21/2016  T:  08/21/2016  Job:  [id number]

## 2016-10-10 NOTE — Addendum Note (Signed)
Addendum  created 10/10/16 1342 by Rica Koyanagi, MD   Sign clinical note

## 2016-12-22 ENCOUNTER — Encounter: Payer: Self-pay | Admitting: Family Medicine

## 2016-12-22 ENCOUNTER — Ambulatory Visit (INDEPENDENT_AMBULATORY_CARE_PROVIDER_SITE_OTHER): Payer: BC Managed Care – PPO | Admitting: Family Medicine

## 2016-12-22 VITALS — BP 132/84 | HR 76 | Temp 98.6°F | Resp 18 | Ht 66.0 in | Wt 181.0 lb

## 2016-12-22 DIAGNOSIS — R5382 Chronic fatigue, unspecified: Secondary | ICD-10-CM

## 2016-12-22 DIAGNOSIS — Z7689 Persons encountering health services in other specified circumstances: Secondary | ICD-10-CM

## 2016-12-22 DIAGNOSIS — Z13220 Encounter for screening for lipoid disorders: Secondary | ICD-10-CM | POA: Diagnosis not present

## 2016-12-22 LAB — TSH: TSH: 1.59 mIU/L

## 2016-12-22 LAB — COMPLETE METABOLIC PANEL WITH GFR
ALBUMIN: 4.1 g/dL (ref 3.6–5.1)
ALK PHOS: 87 U/L (ref 33–130)
ALT: 17 U/L (ref 6–29)
AST: 17 U/L (ref 10–35)
BILIRUBIN TOTAL: 0.6 mg/dL (ref 0.2–1.2)
BUN: 12 mg/dL (ref 7–25)
CALCIUM: 9.4 mg/dL (ref 8.6–10.4)
CO2: 25 mmol/L (ref 20–32)
CREATININE: 0.84 mg/dL (ref 0.50–1.05)
Chloride: 107 mmol/L (ref 98–110)
GFR, Est African American: 88 mL/min (ref >=60)
GFR, Est Non African American: 76 mL/min (ref >=60)
Glucose, Bld: 96 mg/dL (ref 70–99)
POTASSIUM: 4.9 mmol/L (ref 3.5–5.3)
Sodium: 142 mmol/L (ref 135–146)
TOTAL PROTEIN: 6.4 g/dL (ref 6.1–8.1)

## 2016-12-22 LAB — CBC WITH DIFFERENTIAL/PLATELET
BASOS ABS: 34 {cells}/uL (ref 0–200)
Basophils Relative: 1 %
Eosinophils Absolute: 136 cells/uL (ref 15–500)
Eosinophils Relative: 4 %
HEMATOCRIT: 35.8 % (ref 35.0–45.0)
HEMOGLOBIN: 11.9 g/dL — AB (ref 12.0–15.0)
LYMPHS ABS: 884 {cells}/uL (ref 850–3900)
Lymphocytes Relative: 26 %
MCH: 29.9 pg (ref 27.0–33.0)
MCHC: 33.2 g/dL (ref 32.0–36.0)
MCV: 89.9 fL (ref 80.0–100.0)
MONO ABS: 340 {cells}/uL (ref 200–950)
MPV: 8.6 fL (ref 7.5–12.5)
Monocytes Relative: 10 %
NEUTROS PCT: 59 %
Neutro Abs: 2006 cells/uL (ref 1500–7800)
Platelets: 241 10*3/uL (ref 140–400)
RBC: 3.98 MIL/uL (ref 3.80–5.10)
RDW: 13 % (ref 11.0–15.0)
WBC: 3.4 10*3/uL — AB (ref 3.8–10.8)

## 2016-12-22 LAB — LIPID PANEL
CHOL/HDL RATIO: 3.5 ratio (ref <5.0)
CHOLESTEROL: 212 mg/dL — AB (ref <200)
HDL: 61 mg/dL (ref >50)
LDL Cholesterol: 130 mg/dL — ABNORMAL HIGH (ref <100)
Triglycerides: 105 mg/dL (ref <150)
VLDL: 21 mg/dL (ref <30)

## 2016-12-22 MED ORDER — ESCITALOPRAM OXALATE 20 MG PO TABS: 20.0000 mg | ORAL_TABLET | Freq: Every day | ORAL | 3 refills | Status: DC

## 2016-12-22 NOTE — Progress Notes (Signed)
Subjective:    Patient ID: Madison Bryant, female    DOB: 11-15-1955, 61 y.o.   MRN: [id number]  HPI Here today to establish care. Past medical history is significant for anti-Cardiolite and antibody positivity. She has a sister with this condition who suffered pulmonary embolisms as well as strokes is on chronic anticoagulation. She was taking a baby aspirin and was seeing a rheumatologist. Lab work was repeated several years ago and levels have disappeared and the rheumatologist took her off the aspirin. She denies any DVT, PE, miscarriage, stroke history.  She has a history of a hysterectomy done for benign reasons and therefore does not require a Pap smear. Colonoscopy was performed in 2009. She is due again for mammogram and she will schedule this. Tetanus shots up-to-date. Hepatitis C screening was performed 2 years ago. She is due for a shingles shot. She also reports weight gain and fatigue.   Colonoscopy-2009 Mammogram-11/2015 Pap- S/p hysterectomy Hep C- 2016 HIV- Tetanus- 2012  Past Medical History:  Diagnosis Date  . Allergy   . Ankle fracture    left  . Arthritis    fingers, knees and hips  . Asthma   . GERD (gastroesophageal reflux disease)    OTC med prn  . Osteoporosis    Past Surgical History:  Procedure Laterality Date  . ABDOMINAL HYSTERECTOMY    . ORIF FIBULA FRACTURE Left 08/21/2016   Procedure: OPEN REDUCTION INTERNAL FIXATION (ORIF) DISTAL FIBULA FRACTURE;  Surgeon: Ninetta Lights, MD;  Location: [facility];  Service: Orthopedics;  Laterality: Left;  . ROTATOR CUFF REPAIR Right   . TONSILLECTOMY     Allergies  Allergen Reactions  . E-Mycin [Erythromycin]     GI UPSET   Social History   Social History  . Marital status: Married    Spouse name: N/A  . Number of children: N/A  . Years of education: N/A   Occupational History  . Not on file.   Social History Main Topics  . Smoking status: Never Smoker  . Smokeless tobacco:  Never Used  . Alcohol use No  . Drug use: No  . Sexual activity: No   Other Topics Concern  . Not on file   Social History Narrative  . No narrative on file   Family History  Problem Relation Age of Onset  . Miscarriages / [place] Mother      Review of Systems  All other systems reviewed and are negative.      Objective:   Physical Exam  Constitutional: She is oriented to person, place, and time. She appears well-developed and well-nourished. No distress.  HENT:  Head: Normocephalic and atraumatic.  Right Ear: External ear normal.  Left Ear: External ear normal.  Nose: Nose normal.  Mouth/Throat: Oropharynx is clear and moist. No oropharyngeal exudate.  Eyes: Pupils are equal, round, and reactive to light. Conjunctivae and EOM are normal. Right eye exhibits no discharge. Left eye exhibits no discharge. No scleral icterus.  Neck: Normal range of motion. Neck supple. No JVD present. No tracheal deviation present. No thyromegaly present.  Cardiovascular: Normal rate, regular rhythm, normal heart sounds and intact distal pulses.  Exam reveals no gallop and no friction rub.   No murmur heard. Pulmonary/Chest: Effort normal and breath sounds normal. No stridor. No respiratory distress. She has no wheezes. She has no rales. She exhibits no tenderness.  Abdominal: Soft. Bowel sounds are normal. She exhibits no distension and no mass. There is no tenderness.  There is no rebound and no guarding.  Musculoskeletal: Normal range of motion. She exhibits no edema or deformity.  Lymphadenopathy:    She has no cervical adenopathy.  Neurological: She is alert and oriented to person, place, and time. She has normal reflexes. She displays normal reflexes. No cranial nerve deficit. She exhibits normal muscle tone. Coordination normal.  Skin: Skin is warm. No rash noted. She is not diaphoretic. No erythema. No pallor.  Psychiatric: She has a normal mood and affect. Her behavior is normal.  Judgment and thought content normal.  Vitals reviewed.         Assessment & Plan:  Establishing care with new doctor, encounter for - Plan: CBC with Differential/Platelet, COMPLETE METABOLIC PANEL WITH GFR, Lipid panel  Screening cholesterol level - Plan: CBC with Differential/Platelet, COMPLETE METABOLIC PANEL WITH GFR, Lipid panel  Need for hepatitis C screening test  Physical exam today is completely normal. I encouraged the patient to schedule mammogram. Bone density test performed at 67. Pap smear is not required. Colonoscopy is due next year. Tetanus shots up-to-date. Hepatitis C screening is up-to-date. Patient declines HIV screening. Recommended a shingles vaccine. Will check a CBC, CMP, fasting lipid panel. Patient does not require hepatitis C screening test as this is a 30 been done. I will check a TSH given the patient's history of fatigue and weight gain. I have recommended that she gradually wean off Lexapro to see if this will help with weight gain. She will decrease her dose to 10 mg a day for one month and then 5 mg a day for one month and then discontinue. She will call me if her symptoms of anxiety and depression return. I've also recommended a 1500-calorie a day diet and 30 minutes a day of aerobic exercise. If she has not seen weight loss in 6 months with these measures taken, we can discuss medications to help facilitate weight loss.

## 2016-12-26 ENCOUNTER — Other Ambulatory Visit: Payer: Self-pay | Admitting: Family Medicine

## 2016-12-26 MED ORDER — CELECOXIB 200 MG PO CAPS: 200.0000 mg | ORAL_CAPSULE | Freq: Every day | ORAL | 3 refills | Status: DC

## 2017-02-09 LAB — HM MAMMOGRAPHY

## 2017-02-17 ENCOUNTER — Encounter: Payer: Self-pay | Admitting: Family Medicine

## 2017-02-18 ENCOUNTER — Encounter: Payer: Self-pay | Admitting: Family Medicine

## 2017-02-18 ENCOUNTER — Ambulatory Visit (INDEPENDENT_AMBULATORY_CARE_PROVIDER_SITE_OTHER): Payer: BC Managed Care – PPO | Admitting: Family Medicine

## 2017-02-18 VITALS — BP 100/62 | HR 82 | Temp 98.8°F | Resp 14 | Ht 66.0 in | Wt 174.0 lb

## 2017-02-18 DIAGNOSIS — E860 Dehydration: Secondary | ICD-10-CM

## 2017-02-18 DIAGNOSIS — K529 Noninfective gastroenteritis and colitis, unspecified: Secondary | ICD-10-CM | POA: Diagnosis not present

## 2017-02-18 LAB — COMPREHENSIVE METABOLIC PANEL
AG Ratio: 1.9 (calc) (ref 1.0–2.5)
ALBUMIN MSPROF: 4.1 g/dL (ref 3.6–5.1)
ALT: 16 U/L (ref 6–29)
AST: 17 U/L (ref 10–35)
Alkaline phosphatase (APISO): 82 U/L (ref 33–130)
BUN: 16 mg/dL (ref 7–25)
CHLORIDE: 102 mmol/L (ref 98–110)
CO2: 28 mmol/L (ref 20–32)
Calcium: 9 mg/dL (ref 8.6–10.4)
Creat: 0.77 mg/dL (ref 0.50–1.05)
Globulin: 2.2 g/dL (calc) (ref 1.9–3.7)
Glucose, Bld: 118 mg/dL — ABNORMAL HIGH (ref 65–99)
Potassium: 3.8 mmol/L (ref 3.5–5.3)
SODIUM: 138 mmol/L (ref 135–146)
TOTAL PROTEIN: 6.3 g/dL (ref 6.1–8.1)
Total Bilirubin: 1 mg/dL (ref 0.2–1.2)

## 2017-02-18 LAB — CBC
HCT: 38.3 % (ref 35.0–45.0)
Hemoglobin: 13 g/dL (ref 11.7–15.5)
MCH: 30.2 pg (ref 27.0–33.0)
MCHC: 33.9 g/dL (ref 32.0–36.0)
MCV: 89.1 fL (ref 80.0–100.0)
PLATELETS: 213 10*3/uL (ref 140–400)
RBC: 4.3 10*6/uL (ref 3.80–5.10)
RDW: 13 % (ref 11.0–15.0)
WBC: 4.7 10*3/uL (ref 3.8–10.8)

## 2017-02-18 MED ORDER — ONDANSETRON 4 MG PO TBDP: 4.0000 mg | ORAL_TABLET | Freq: Three times a day (TID) | ORAL | 0 refills | Status: DC | PRN

## 2017-02-18 NOTE — Patient Instructions (Signed)
Take zofran Keep hydrated  F/U as needed

## 2017-02-18 NOTE — Progress Notes (Signed)
   Subjective:    Patient ID: Madison Bryant, female    DOB: May 10, 1956, 61 y.o.   MRN: [id number]  Patient presents for Nausea; Vomiting; and Back Pain  Pt here with N/V for the past 24 hours, last emesis  Yesterday around 5pm but still very nauseous. No abdominal pain,no diarrhea. No UTI symptoms, no cough or congestion,no fever. +sick contact with grand-daughter who had GI bug last week, she as her caregiver while she was sick. No solids only spirite and coke   She did have some walnuts Monday night that looked "bad"      Review Of Systems:  GEN- denies fatigue, fever, weight loss,weakness, recent illness HEENT- denies eye drainage, change in vision, nasal discharge, CVS- denies chest pain, palpitations RESP- denies SOB, cough, wheeze ABD- + N/V, change in stools, abd pain GU- denies dysuria, hematuria, dribbling, incontinence MSK- denies joint pain, muscle aches, injury Neuro- denies headache, dizziness, syncope, seizure activity       Objective:    BP 100/62   Pulse 82   Temp 98.8 F (37.1 C) (Oral)   Resp 14   Ht 5\' 6"  (1.676 m)   Wt 174 lb (78.9 kg)   BMI 28.08 kg/m  GEN- NAD, alert and oriented x3 HEENT- PERRL, EOMI, non injected sclera, pink conjunctiva, Dry MM oropharynx clear Neck- Supple, no LAD CVS- RRR, no murmur RESP-CTAB ABD-NABS,soft,NT,ND EXT- No edema Pulses- Radial 2+        Assessment & Plan:      Problem List Items Addressed This Visit    None    Visit Diagnoses    Gastroenteritis    -  Primary   Treat for Eneteiris based on history, given 1 Liter NS in office for dehydration, zofran for nausea, advance to broth then solids slowly. CBC unremarkable Abdominal exam is benign she is to call for any other symptoms. Metabolic panel has been sent    Relevant Orders   CBC (Completed)   Comprehensive metabolic panel   Dehydration          Note: This dictation was prepared with Dragon dictation along with smaller phrase technology. Any  transcriptional errors that result from this process are unintentional.

## 2017-05-21 ENCOUNTER — Ambulatory Visit: Payer: BC Managed Care – PPO | Admitting: Family Medicine

## 2017-05-21 ENCOUNTER — Encounter: Payer: Self-pay | Admitting: Family Medicine

## 2017-05-21 VITALS — BP 100/62 | HR 84 | Temp 98.3°F | Resp 18 | Ht 66.0 in | Wt 177.0 lb

## 2017-05-21 DIAGNOSIS — L03012 Cellulitis of left finger: Secondary | ICD-10-CM | POA: Diagnosis not present

## 2017-05-21 MED ORDER — CEPHALEXIN 500 MG PO CAPS: 500.0000 mg | ORAL_CAPSULE | Freq: Three times a day (TID) | ORAL | 0 refills | Status: DC

## 2017-05-21 NOTE — Progress Notes (Signed)
Subjective:    Patient ID: Madison Bryant, female    DOB: 23-Aug-1955, 62 y.o.   MRN: [id number]  HPI One week ago, patient lacerated the radial side of her left index finger just distal to the DIP joint while peeling potatoes with a knife. Tetanus shot was 3 years ago. Laceration has closed on its own but now there is an erythematous warm tender patch just distal to the PIP joint approximately 6 mm in diameter. This was originally much larger on Monday but has slowly improved over the week and is much better today. Flexion and extension of all the joints of the finger are intact. The finger is neurovascularly intact Past Medical History:  Diagnosis Date  . Allergy   . Ankle fracture    left  . Arthritis    fingers, knees and hips  . Asthma   . GERD (gastroesophageal reflux disease)    OTC med prn  . Osteoporosis    Past Surgical History:  Procedure Laterality Date  . ABDOMINAL HYSTERECTOMY    . ORIF FIBULA FRACTURE Left 08/21/2016   Procedure: OPEN REDUCTION INTERNAL FIXATION (ORIF) DISTAL FIBULA FRACTURE;  Surgeon: Ninetta Lights, MD;  Location: [facility];  Service: Orthopedics;  Laterality: Left;  . ROTATOR CUFF REPAIR Right   . TONSILLECTOMY     Current Outpatient Medications on File Prior to Visit  Medication Sig Dispense Refill  . albuterol (PROVENTIL HFA;VENTOLIN HFA) 108 (90 Base) MCG/ACT inhaler Inhale into the lungs every 6 (six) hours as needed for wheezing or shortness of breath.    . celecoxib (CELEBREX) 200 MG capsule Take 1 capsule (200 mg total) by mouth daily. 90 capsule 3  . escitalopram (LEXAPRO) 20 MG tablet Take 1 tablet (20 mg total) by mouth daily. (Patient taking differently: Take 10 mg by mouth daily. ) 90 tablet 3  . esomeprazole (NEXIUM) 20 MG capsule Take 20 mg by mouth daily at 12 noon.    . fexofenadine (ALLEGRA) 180 MG tablet Take 180 mg by mouth daily.    . mometasone (ASMANEX) 220 MCG/INH inhaler Inhale into the lungs as needed.      . Multiple Vitamin (MULTIVITAMIN WITH MINERALS) TABS tablet Take 1 tablet by mouth daily.    Marland Kitchen oxyCODONE-acetaminophen (PERCOCET/ROXICET) 5-325 MG tablet Take 1 tablet by mouth every 4 (four) hours as needed for severe pain.     No current facility-administered medications on file prior to visit.    Allergies  Allergen Reactions  . E-Mycin [Erythromycin]     GI UPSET   Social History   Socioeconomic History  . Marital status: Married    Spouse name: Not on file  . Number of children: Not on file  . Years of education: Not on file  . Highest education level: Not on file  Social Needs  . Financial resource strain: Not on file  . Food insecurity - worry: Not on file  . Food insecurity - inability: Not on file  . Transportation needs - medical: Not on file  . Transportation needs - non-medical: Not on file  Occupational History  . Not on file  Tobacco Use  . Smoking status: Never Smoker  . Smokeless tobacco: Never Used  Substance and Sexual Activity  . Alcohol use: No  . Drug use: No  . Sexual activity: No  Other Topics Concern  . Not on file  Social History Narrative  . Not on file      Review of Systems  All other systems reviewed and are negative.      Objective:   Physical Exam  Cardiovascular: Normal rate, regular rhythm and normal heart sounds.  Pulmonary/Chest: Effort normal and breath sounds normal.  Musculoskeletal:       Hands: Vitals reviewed.         Assessment & Plan:  Cellulitis of finger of left hand  Begin Keflex 500 mg by mouth 3 times a day for 7 days. If situation worsens, consider incision and evaluation for small retained foreign body.

## 2017-06-04 ENCOUNTER — Encounter: Payer: Self-pay | Admitting: Family Medicine

## 2017-06-04 ENCOUNTER — Ambulatory Visit: Payer: BC Managed Care – PPO | Admitting: Family Medicine

## 2017-06-04 VITALS — BP 110/64 | HR 90 | Temp 98.3°F | Resp 14 | Ht 66.0 in | Wt 173.0 lb

## 2017-06-04 DIAGNOSIS — J209 Acute bronchitis, unspecified: Secondary | ICD-10-CM | POA: Diagnosis not present

## 2017-06-04 MED ORDER — HYDROCODONE-HOMATROPINE 5-1.5 MG/5ML PO SYRP
5.0000 mL | ORAL_SOLUTION | Freq: Three times a day (TID) | ORAL | 0 refills | Status: DC | PRN
Start: 2017-06-04 — End: 2017-10-06

## 2017-06-04 MED ORDER — DOXYCYCLINE HYCLATE 100 MG PO TABS
100.0000 mg | ORAL_TABLET | Freq: Two times a day (BID) | ORAL | 0 refills | Status: DC
Start: 2017-06-04 — End: 2017-10-06

## 2017-06-04 NOTE — Progress Notes (Signed)
Subjective:    Patient ID: Madison Bryant, female    DOB: Mar 24, 1956, 62 y.o.   MRN: [id number]  HPI Symptoms began 48 hours ago. Symptoms include wheezing, cough productive of clear sputum, chest congestion, subjective fevers and chills, wheezing, and mild shortness of breath. Exam is consistent assistant with bronchitis that she does have expiratory wheezing with rhonchorous breath sounds. The wheezing is very mild. She denies any rhinorrhea or sinus pain or otalgia. She denies any body aches consistent with the flu. She denies any nausea or vomiting Past Medical History:  Diagnosis Date  . Allergy   . Ankle fracture    left  . Arthritis    fingers, knees and hips  . Asthma   . GERD (gastroesophageal reflux disease)    OTC med prn  . Osteoporosis    Past Surgical History:  Procedure Laterality Date  . ABDOMINAL HYSTERECTOMY    . ORIF FIBULA FRACTURE Left 08/21/2016   Procedure: OPEN REDUCTION INTERNAL FIXATION (ORIF) DISTAL FIBULA FRACTURE;  Surgeon: Ninetta Lights, MD;  Location: [facility];  Service: Orthopedics;  Laterality: Left;  . ROTATOR CUFF REPAIR Right   . TONSILLECTOMY     Current Outpatient Medications on File Prior to Visit  Medication Sig Dispense Refill  . albuterol (PROVENTIL HFA;VENTOLIN HFA) 108 (90 Base) MCG/ACT inhaler Inhale into the lungs every 6 (six) hours as needed for wheezing or shortness of breath.    . celecoxib (CELEBREX) 200 MG capsule Take 1 capsule (200 mg total) by mouth daily. 90 capsule 3  . cephALEXin (KEFLEX) 500 MG capsule Take 1 capsule (500 mg total) by mouth 3 (three) times daily. 21 capsule 0  . escitalopram (LEXAPRO) 20 MG tablet Take 1 tablet (20 mg total) by mouth daily. (Patient taking differently: Take 10 mg by mouth daily. ) 90 tablet 3  . esomeprazole (NEXIUM) 20 MG capsule Take 20 mg by mouth daily at 12 noon.    . fexofenadine (ALLEGRA) 180 MG tablet Take 180 mg by mouth daily.    . mometasone (ASMANEX) 220  MCG/INH inhaler Inhale into the lungs as needed.    . Multiple Vitamin (MULTIVITAMIN WITH MINERALS) TABS tablet Take 1 tablet by mouth daily.    Marland Kitchen oxyCODONE-acetaminophen (PERCOCET/ROXICET) 5-325 MG tablet Take 1 tablet by mouth every 4 (four) hours as needed for severe pain.     No current facility-administered medications on file prior to visit.    Allergies  Allergen Reactions  . E-Mycin [Erythromycin]     GI UPSET   Social History   Socioeconomic History  . Marital status: Married    Spouse name: Not on file  . Number of children: Not on file  . Years of education: Not on file  . Highest education level: Not on file  Social Needs  . Financial resource strain: Not on file  . Food insecurity - worry: Not on file  . Food insecurity - inability: Not on file  . Transportation needs - medical: Not on file  . Transportation needs - non-medical: Not on file  Occupational History  . Not on file  Tobacco Use  . Smoking status: Never Smoker  . Smokeless tobacco: Never Used  Substance and Sexual Activity  . Alcohol use: No  . Drug use: No  . Sexual activity: No  Other Topics Concern  . Not on file  Social History Narrative  . Not on file     Review of Systems  All other systems  reviewed and are negative.      Objective:   Physical Exam  Constitutional: She appears well-developed and well-nourished. No distress.  HENT:  Right Ear: External ear normal.  Left Ear: External ear normal.  Nose: Nose normal.  Mouth/Throat: Oropharynx is clear and moist. No oropharyngeal exudate.  Eyes: Conjunctivae are normal.  Neck: Neck supple.  Cardiovascular: Normal rate, regular rhythm and normal heart sounds.  Pulmonary/Chest: She has wheezes.  Abdominal: Soft. Bowel sounds are normal.  Lymphadenopathy:    She has no cervical adenopathy.  Skin: She is not diaphoretic.  Vitals reviewed.  Hoarse       Assessment & Plan:  Bronchitis, acute, with bronchospasm - Plan:  doxycycline (VIBRA-TABS) 100 MG tablet, HYDROcodone-homatropine (HYCODAN) 5-1.5 MG/5ML syrup  I believe the patient has bronchitis. Begin doxycycline 100 mg by mouth twice a day for 10 days coupled with Hycodan 1 teaspoon every 6 hours as needed for cough. 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 I'm doing this to hopefully avoid spreading her illness throughout the school population

## 2017-07-23 ENCOUNTER — Other Ambulatory Visit: Payer: Self-pay | Admitting: Family Medicine

## 2017-07-23 MED ORDER — ESCITALOPRAM OXALATE 20 MG PO TABS: 20.0000 mg | ORAL_TABLET | Freq: Every day | ORAL | 3 refills | Status: DC

## 2017-10-06 ENCOUNTER — Encounter: Payer: Self-pay | Admitting: Family Medicine

## 2017-10-06 ENCOUNTER — Ambulatory Visit
Admission: RE | Admit: 2017-10-06 | Discharge: 2017-10-06 | Disposition: A | Discharge status: Home / Self Care | Payer: BC Managed Care – PPO | Source: Ambulatory Visit | Attending: Family Medicine | Admitting: Family Medicine

## 2017-10-06 ENCOUNTER — Ambulatory Visit: Payer: BC Managed Care – PPO | Admitting: Family Medicine

## 2017-10-06 ENCOUNTER — Telehealth: Payer: Self-pay | Admitting: Family Medicine

## 2017-10-06 VITALS — BP 124/90 | HR 81 | Temp 98.1°F | Resp 16 | Ht 66.0 in | Wt 180.8 lb

## 2017-10-06 DIAGNOSIS — S91352A Open bite, left foot, initial encounter: Secondary | ICD-10-CM

## 2017-10-06 DIAGNOSIS — L089 Local infection of the skin and subcutaneous tissue, unspecified: Secondary | ICD-10-CM | POA: Diagnosis not present

## 2017-10-06 DIAGNOSIS — W5501XA Bitten by cat, initial encounter: Secondary | ICD-10-CM | POA: Diagnosis not present

## 2017-10-06 DIAGNOSIS — L03116 Cellulitis of left lower limb: Secondary | ICD-10-CM | POA: Diagnosis not present

## 2017-10-06 NOTE — Progress Notes (Signed)
Patient ID: Madison Bryant, female    DOB: 03-20-1956, 62 y.o.   MRN: [id number]  PCP: Susy Frizzle, MD  Chief Complaint  Patient presents with  . cat bite on left foot    happened friday went to urgent care    Subjective:   Madison Bryant is a 62 y.o. female, presents to clinic with CC of cat bite to left foot onset 4 days ago, here for recheck, was initally evaluated in an urgent care 2 days ago after she developed redness, swelling and pain.  She accidentally stepped on a cat's tail, it was under the table and she could not see it, in response it bit her, causing two puncture wounds to left foot, one to medial aspect of 1st MTP joint and second puncture to left dorsal foot in between distal 1st and 2nd MT's.   Prior to presentation at urgent care 2 days ago, she tx with antibiotic ointment and warm water soaks.  Redness, swelling and pain worsened.  At UC she was evaluated, dx with cat bite and cellulitis, received 250 mg IM rocephin, rx for augmentin, and no tetanus updated because she is very certain her last booster was less than 10 years ago, she does not know exact date.  Per chart review today, last Tdap was 05/13/10. She presents with worsening swelling, redness and pain, she has taken 3 doses of augmentin, and is due for next dose later today (48 hours since start of Abx).  She reports that Sunday night (2 days ago) and Monday morning it was worse than when she was evaluated at [facility] Sunday morning, however since starting abx, the area of redness and degree of swelling have not continued to increase, but have stayed the same.  She is taking lots of pictures and shows me Sunday morning, which appears much less edematous than currently, also much less erythema.  Pain is constant, gradually worsening x 4 days, rated 8/10 in severity, throbbing, stinging, exacerbated with walking, palpation or movement.  Pain is limited to puncture wounds and surrounding swollen and red skin to left  dorsal foot, she denies any radiation of pain to ankle or up leg with movement.  Denies numbness, tingling, pallor, weakness.  Puncture wounds are still open, occasional clear drainage, she denies purulent or bloody discharge.  No xrays done.  She denies body aches, fever, chills, sweats, N, V, HA, muscle spasms.      There are no active problems to display for this patient.    Prior to Admission medications   Medication Sig Start Date End Date Taking? Authorizing Provider  amoxicillin-clavulanate (AUGMENTIN) 875-125 MG tablet Take 1 tablet by mouth 2 (two) times daily.   Yes [provider]  celecoxib (CELEBREX) 200 MG capsule Take 1 capsule (200 mg total) by mouth daily. 12/26/16  Yes Susy Frizzle, MD  escitalopram (LEXAPRO) 20 MG tablet Take 1 tablet (20 mg total) by mouth daily. 07/23/17  Yes Susy Frizzle, MD  esomeprazole (NEXIUM) 20 MG capsule Take 20 mg by mouth daily at 12 noon.   Yes [provider]  fexofenadine (ALLEGRA) 180 MG tablet Take 180 mg by mouth daily.   Yes [provider]  Multiple Vitamin (MULTIVITAMIN WITH MINERALS) TABS tablet Take 1 tablet by mouth daily.   Yes [provider]  mupirocin ointment (BACTROBAN) 2 % Place 1 application into the nose 3 (three) times daily.   Yes [provider]  Allergies  Allergen Reactions  . E-Mycin [Erythromycin]     GI UPSET     Family History  Problem Relation Age of Onset  . Miscarriages / [place] Mother      Social History   Socioeconomic History  . Marital status: Married    Spouse name: Not on file  . Number of children: Not on file  . Years of education: Not on file  . Highest education level: Not on file  Occupational History  . Not on file  Social Needs  . Financial resource strain: Not on file  . Food insecurity:    Worry: Not on file    Inability: Not on file  . Transportation needs:    Medical: Not on file    Non-medical: Not on file    Tobacco Use  . Smoking status: Never Smoker  . Smokeless tobacco: Never Used  Substance and Sexual Activity  . Alcohol use: No  . Drug use: No  . Sexual activity: Never  Lifestyle  . Physical activity:    Days per week: Not on file    Minutes per session: Not on file  . Stress: Not on file  Relationships  . Social connections:    Talks on phone: Not on file    Gets together: Not on file    Attends religious service: Not on file    Active member of club or organization: Not on file    Attends meetings of clubs or organizations: Not on file    Relationship status: Not on file  . Intimate partner violence:    Fear of current or ex partner: Not on file    Emotionally abused: Not on file    Physically abused: Not on file    Forced sexual activity: Not on file  Other Topics Concern  . Not on file  Social History Narrative  . Not on file     Review of Systems  Constitutional: Negative for activity change, appetite change, chills, diaphoresis, fatigue and fever.  Respiratory: Negative.   Cardiovascular: Negative.   Gastrointestinal: Negative.   Genitourinary: Negative.   Musculoskeletal: Negative.  Negative for arthralgias, joint swelling and myalgias.  Skin: Positive for color change and wound. Negative for pallor.  Allergic/Immunologic: Negative.  Negative for immunocompromised state.  Hematological: Negative.  Negative for adenopathy. Does not bruise/bleed easily.  All other systems reviewed and are negative.      Objective:    Vitals:   10/06/17 0923  BP: 124/90  Pulse: 81  Resp: 16  Temp: 98.1 F (36.7 C)  TempSrc: Oral  SpO2: 96%  Weight: 180 lb 12.8 oz (82 kg)  Height: 5\' 6"  (1.676 m)      Physical Exam  Constitutional: She appears well-developed and well-nourished. No distress.  HENT:  Head: Normocephalic and atraumatic.  Nose: Nose normal.  Eyes: Conjunctivae are normal. Right eye exhibits no discharge. Left eye exhibits no discharge.  Neck: No  tracheal deviation present.  Cardiovascular: Normal rate and regular rhythm.  Pulses:      Posterior tibial pulses are 2+ on the right side, and 2+ on the left side.  Pulmonary/Chest: Effort normal. No stridor. No respiratory distress.  Musculoskeletal: Normal range of motion.       Left ankle: She exhibits normal range of motion, no swelling, no ecchymosis, no deformity and normal pulse. No tenderness.       Left foot: There is tenderness, swelling and laceration. There is normal range of motion, no bony  tenderness, normal capillary refill, no crepitus and no deformity.  No streaking redness up left ankle or leg, no calf tenderness to palpation   Neurological: She is alert. She exhibits normal muscle tone. Coordination normal.  Skin: Skin is warm and dry. Capillary refill takes less than 2 seconds. No rash noted. She is not diaphoretic. There is erythema.  2 puncture wounds, less than 1 cm in length and roughly 2 to 3 mm wide to left dorsal foot, 1 to medial aspect of first and TP joint, the other to dorsum of foot between distal first and second metatarsals with surrounding diffuse edema and severe erythema across dorsum of foot, does not extend to toes, very tender to palpation, no active drainage, no purulence, no fluctuance, no crepitus palpated Normal sensation to light touch bilateral lower extremities  Psychiatric: She has a normal mood and affect. Her behavior is normal.  Nursing note and vitals reviewed.            Assessment & Plan:      ICD-10-CM   1. Cellulitis of left foot L03.116 DG Foot Complete Left  2. Cat bite of left foot with infection, initial encounter S91.352A DG Foot Complete Left   L08.9    W55.01XA     Patient with cat bite with delayed presentation to urgent care over the weekend, onset was 4 days ago, presented to urgent care for evaluation 2 days after bite by domestic cat after she accidentally stepped on it.  She was given Rocephin shot and started on  Augmentin. Concerning for cellulitis and given location concern for deep space infection or taking infection given close summary to tendons and ligaments and foot.  She has not technically failed antibiotic treatment yet, not quite reached the 48-hour period.  He was instructed to outline erythema with permanent marker, go to the ER if edema or erythema spreads beyond this or if she has streaking redness.  Will obtain x-ray to ensure no retained teeth or foreign bodies.  Patient does have a orthopedist she is encouraged to contact their office to see if they will see her for follow-up.  She was also can follow-up here in 2 days for recheck.  I just do not want to delay a specialist evaluating her if the infection is not improving.  No history of diabetes or immunocompromise, no history of MRSA or poor wound healing.  On exam do not feel any crepitus, joints below and above area of injury have no pain is reassuring for likely no tendon injuries or current tendon infection/inflammation.   Delsa Grana, PA-C 10/06/17 11:40 PM

## 2017-10-06 NOTE — Telephone Encounter (Signed)
It is highly unlikely that the cat had rabies, especially if it is a Air traffic controller.  It was stepped on (provoked - accidentally), did not attack unprovoked, and this also makes rabies highly unlikely.   She should contact the animal control department where the incident occurred, local non-emergent police in [place] may be able to help her do that report.  ([place] [facility] ([facility]) [place] in [place] 24 hours a day - she should [phone].)   The animal is usually observed for 10 days and if the animal is asymptomatic then she does not need to start rabies immunoglobulin and vaccine series, because this period of watching the animal means there was no possiblity of spreading rabies virus to you.   If the animal shows rabid behavior within 10 days of bite, then it is euthanized and tested and the bit pt will start treatment immediately. She is correct that those meds are only available in ER's because of the extremely high price.    There is a lot of excellent material for her to look up at - if she searched http://www.fletcher.com/ for reportable diseases/rabies.  In the state of [place], cases of rabies has been found in skunk, raccoon, bats, foxes.  Since 2010 there have only been 4 cases in cats, and on average in the state 6 cases per year of rabies in domestic animals.    I think she has time to get the animal into observation (which commonly can be done in the cat's home), she has about 6 days left.  Right now I am definitely more concerned with the cellulitis and making sure it responds to antibiotics, and having close follow up plan established, either with me or ortho in the next 2 days.

## 2017-10-06 NOTE — Patient Instructions (Addendum)
Continue antibiotic  Continue soaks  Get x-ray done  Call your orthopedist to be seen this week in the next 2-3 days  Or I can put in a referral to a foot specialist to see you urgently this week.

## 2017-10-06 NOTE — Telephone Encounter (Signed)
Pt called to inform us that the cat she was bitten by has not been vaccinated since 2015, she wants [place] to advise her on what to do whether it is going to ER to have rabies vaccines done or whatever. Please call her.

## 2017-10-06 NOTE — Progress Notes (Signed)
The xray shows soft tissue swelling, consistent with cellulitis.   Images and radiology report personally reviewed.  No foreign bodies.  No subcutaneous emphysema.  Agree with Radiology findings.    Pt will be notified.

## 2017-10-07 ENCOUNTER — Encounter (INDEPENDENT_AMBULATORY_CARE_PROVIDER_SITE_OTHER): Payer: Self-pay | Admitting: Orthopaedic Surgery

## 2017-10-07 ENCOUNTER — Ambulatory Visit (INDEPENDENT_AMBULATORY_CARE_PROVIDER_SITE_OTHER): Payer: BC Managed Care – PPO | Admitting: Orthopaedic Surgery

## 2017-10-07 DIAGNOSIS — M79672 Pain in left foot: Secondary | ICD-10-CM | POA: Diagnosis not present

## 2017-10-07 MED ORDER — DOXYCYCLINE HYCLATE 100 MG PO TABS: 100.0000 mg | ORAL_TABLET | Freq: Two times a day (BID) | ORAL | 0 refills | Status: DC

## 2017-10-07 NOTE — Telephone Encounter (Signed)
Left message return call

## 2017-10-07 NOTE — Progress Notes (Addendum)
Office Visit Note   Patient: Madison Bryant           Date of Birth: 1955/12/24           MRN: [id number] Visit Date: 10/07/2017              Requested by: Susy Frizzle, MD [address] [place], [place] [address] PCP: Susy Frizzle, MD   Assessment & Plan: Visit Diagnoses:  1. Left foot pain     Plan: Impression is cat bite to left foot with cellulitis.  She is currently on Augmentin but I would like to also add doxycycline.  We discussed that I would like to watch this closely for the next couple of days.  Is not obvious if she is developing an abscess or not.  I have marked out the borders of the cellulitis so that we can keep track more easily.  I have written out of work for 2 weeks.  She needs to elevate at all times.  She will communicate through our staff if there is any worsening.  This note is dated 10/09/2017 .  this is an addendum to the visit.  I evaluated the patient through clinical pictures which showed rupture of the dorsum of the foot with purulent drainage and worsening cellulitis.  At this point patient needs surgical debridement and incision to evacuate the infection.  She will need to be admitted to the hospital for IV antibiotics.  This was discussed with the patient who is in agreement.  We will do this today.  Follow-Up Instructions: Return if symptoms worsen or fail to improve.   Orders:  No orders of the defined types were placed in this encounter.  Meds ordered this encounter  Medications  . doxycycline (VIBRA-TABS) 100 MG tablet    Sig: Take 1 tablet (100 mg total) by mouth 2 (two) times daily.    Dispense:  28 tablet    Refill:  0      Procedures: No procedures performed   Clinical Data: No additional findings.   Subjective: Chief Complaint  Patient presents with  . Left Foot - Pain    Madison Bryant is a 62 year old female who was bitten by an unvaccinated cat on 10/02/2017 while she was in [place].  She was evaluated at a  local urgent care and placed on Augmentin.  She has been ambulating on this foot.  She states that her pain has gotten worse.  She denies any fevers and chills.  Denies any drainage.  She does admit that she is not elevated as much as she should.   Review of Systems  Constitutional: Negative.   HENT: Negative.   Eyes: Negative.   Respiratory: Negative.   Cardiovascular: Negative.   Endocrine: Negative.   Musculoskeletal: Negative.   Neurological: Negative.   Hematological: Negative.   Psychiatric/Behavioral: Negative.   All other systems reviewed and are negative.    Objective: Vital Signs: There were no vitals taken for this visit.  Physical Exam  Constitutional: She is oriented to person, place, and time. She appears well-developed and well-nourished.  HENT:  Head: Normocephalic and atraumatic.  Eyes: EOM are normal.  Neck: Neck supple.  Pulmonary/Chest: Effort normal.  Abdominal: Soft.  Neurological: She is alert and oriented to person, place, and time.  Skin: Skin is warm. Capillary refill takes less than 2 seconds.  Psychiatric: She has a normal mood and affect. Her behavior is normal. Judgment and thought content normal.  Nursing note and vitals reviewed.   Ortho Exam Left foot exam shows cellulitis surrounding the 2 puncture wounds.  There is no drainage.  This is hemostatic.  Foot is warm well perfused with a bounding pulse.  No obvious evidence of fluctuance or fluid collection.  Overall appearance is more edematous. Specialty Comments:  No specialty comments available.  Imaging: No results found.   PMFS History: There are no active problems to display for this patient.  Past Medical History:  Diagnosis Date  . Allergy   . Ankle fracture    left  . Arthritis    fingers, knees and hips  . Asthma   . GERD (gastroesophageal reflux disease)    OTC med prn  . Osteoporosis     Family History  Problem Relation Age of Onset  . Miscarriages / [place]  Mother     Past Surgical History:  Procedure Laterality Date  . ABDOMINAL HYSTERECTOMY    . ORIF FIBULA FRACTURE Left 08/21/2016   Procedure: OPEN REDUCTION INTERNAL FIXATION (ORIF) DISTAL FIBULA FRACTURE;  Surgeon: Ninetta Lights, MD;  Location: [facility];  Service: Orthopedics;  Laterality: Left;  . ROTATOR CUFF REPAIR Right   . TONSILLECTOMY     Social History   Occupational History  . Not on file  Tobacco Use  . Smoking status: Never Smoker  . Smokeless tobacco: Never Used  Substance and Sexual Activity  . Alcohol use: No  . Drug use: No  . Sexual activity: Never      Work note faxed to Shonna Chock [phone] at patient's request.

## 2017-10-08 ENCOUNTER — Other Ambulatory Visit: Payer: Self-pay

## 2017-10-08 ENCOUNTER — Encounter (HOSPITAL_COMMUNITY): Payer: Self-pay | Admitting: *Deleted

## 2017-10-08 ENCOUNTER — Other Ambulatory Visit (INDEPENDENT_AMBULATORY_CARE_PROVIDER_SITE_OTHER): Payer: Self-pay | Admitting: Orthopaedic Surgery

## 2017-10-08 NOTE — Progress Notes (Signed)
Pt denies SOB, chest pain, and being under the care of a cardiologist. Pt denies having a stress test, echo and cardiac cath. Pt denies having an EKG and chest x ray within the last year. Pr denies recent labs. Pt stated that she does not take Aspirin. Pt made aware to stop taking vitamins, fish oil and herbal medications. Do not take any NSAIDs ie: Ibuprofen, Advil, Naproxen(Aleve), Celebrex, Motrin, BC and Goody Powder. Pt verba;lized understanding of all pre-op instructions.

## 2017-10-09 ENCOUNTER — Inpatient Hospital Stay (HOSPITAL_COMMUNITY): Payer: BC Managed Care – PPO | Admitting: Anesthesiology

## 2017-10-09 ENCOUNTER — Inpatient Hospital Stay (HOSPITAL_COMMUNITY)
Admission: RE | Admit: 2017-10-09 | Discharge: 2017-10-12 | DRG: 581 | Disposition: A | Discharge status: Home / Self Care | Payer: BC Managed Care – PPO | Attending: Orthopaedic Surgery | Admitting: Orthopaedic Surgery

## 2017-10-09 ENCOUNTER — Encounter (HOSPITAL_COMMUNITY): Payer: Self-pay | Admitting: *Deleted

## 2017-10-09 ENCOUNTER — Encounter (HOSPITAL_COMMUNITY)
Admission: RE | Disposition: A | Discharge status: Home / Self Care | Payer: Self-pay | Source: Home / Self Care | Attending: Orthopaedic Surgery

## 2017-10-09 DIAGNOSIS — A280 Pasteurellosis: Secondary | ICD-10-CM

## 2017-10-09 DIAGNOSIS — L0390 Cellulitis, unspecified: Secondary | ICD-10-CM

## 2017-10-09 DIAGNOSIS — Z973 Presence of spectacles and contact lenses: Secondary | ICD-10-CM

## 2017-10-09 DIAGNOSIS — B9689 Other specified bacterial agents as the cause of diseases classified elsewhere: Secondary | ICD-10-CM | POA: Diagnosis present

## 2017-10-09 DIAGNOSIS — Z79899 Other long term (current) drug therapy: Secondary | ICD-10-CM | POA: Diagnosis not present

## 2017-10-09 DIAGNOSIS — M659 Synovitis and tenosynovitis, unspecified: Secondary | ICD-10-CM

## 2017-10-09 DIAGNOSIS — W5501XA Bitten by cat, initial encounter: Secondary | ICD-10-CM

## 2017-10-09 DIAGNOSIS — M810 Age-related osteoporosis without current pathological fracture: Secondary | ICD-10-CM | POA: Diagnosis present

## 2017-10-09 DIAGNOSIS — M65972 Unspecified synovitis and tenosynovitis, left ankle and foot: Secondary | ICD-10-CM

## 2017-10-09 DIAGNOSIS — K219 Gastro-esophageal reflux disease without esophagitis: Secondary | ICD-10-CM | POA: Diagnosis present

## 2017-10-09 DIAGNOSIS — M19049 Primary osteoarthritis, unspecified hand: Secondary | ICD-10-CM | POA: Diagnosis present

## 2017-10-09 DIAGNOSIS — L02612 Cutaneous abscess of left foot: Secondary | ICD-10-CM | POA: Diagnosis present

## 2017-10-09 DIAGNOSIS — M65172 Other infective (teno)synovitis, left ankle and foot: Secondary | ICD-10-CM | POA: Diagnosis present

## 2017-10-09 DIAGNOSIS — Z792 Long term (current) use of antibiotics: Secondary | ICD-10-CM

## 2017-10-09 DIAGNOSIS — M1990 Unspecified osteoarthritis, unspecified site: Secondary | ICD-10-CM

## 2017-10-09 DIAGNOSIS — Z90710 Acquired absence of both cervix and uterus: Secondary | ICD-10-CM

## 2017-10-09 DIAGNOSIS — Z881 Allergy status to other antibiotic agents status: Secondary | ICD-10-CM | POA: Diagnosis not present

## 2017-10-09 DIAGNOSIS — M170 Bilateral primary osteoarthritis of knee: Secondary | ICD-10-CM | POA: Diagnosis present

## 2017-10-09 HISTORY — PX: I & D EXTREMITY: SHX5045

## 2017-10-09 HISTORY — DX: Nausea with vomiting, unspecified: R11.2

## 2017-10-09 HISTORY — DX: Presence of spectacles and contact lenses: Z97.3

## 2017-10-09 HISTORY — DX: Other specified postprocedural states: Z98.890

## 2017-10-09 HISTORY — DX: Cellulitis, unspecified: L03.90

## 2017-10-09 LAB — SEDIMENTATION RATE: SED RATE: 59 mm/h — AB (ref 0–22)

## 2017-10-09 LAB — CBC
HCT: 39.3 % (ref 36.0–46.0)
HEMOGLOBIN: 12.9 g/dL (ref 12.0–15.0)
MCH: 29 pg (ref 26.0–34.0)
MCHC: 32.8 g/dL (ref 30.0–36.0)
MCV: 88.3 fL (ref 78.0–100.0)
PLATELETS: 333 10*3/uL (ref 150–400)
RBC: 4.45 MIL/uL (ref 3.87–5.11)
RDW: 12 % (ref 11.5–15.5)
WBC: 5.6 10*3/uL (ref 4.0–10.5)

## 2017-10-09 LAB — BASIC METABOLIC PANEL
ANION GAP: 9 (ref 5–15)
BUN: 12 mg/dL (ref 6–20)
CALCIUM: 9.2 mg/dL (ref 8.9–10.3)
CO2: 26 mmol/L (ref 22–32)
Chloride: 106 mmol/L (ref 101–111)
Creatinine, Ser: 0.76 mg/dL (ref 0.44–1.00)
Glucose, Bld: 105 mg/dL — ABNORMAL HIGH (ref 65–99)
Potassium: 3.8 mmol/L (ref 3.5–5.1)
SODIUM: 141 mmol/L (ref 135–145)

## 2017-10-09 LAB — C-REACTIVE PROTEIN: CRP: 5.6 mg/dL — AB (ref <1.0)

## 2017-10-09 SURGERY — IRRIGATION AND DEBRIDEMENT EXTREMITY
Anesthesia: General | Site: Foot | Laterality: Left

## 2017-10-09 MED ORDER — PHENYLEPHRINE 40 MCG/ML (10ML) SYRINGE FOR IV PUSH (FOR BLOOD PRESSURE SUPPORT)
PREFILLED_SYRINGE | INTRAVENOUS | Status: DC | PRN
  Administered 2017-10-09: 80 ug via INTRAVENOUS

## 2017-10-09 MED ORDER — MORPHINE SULFATE (PF) 2 MG/ML IV SOLN: 1.0000 mg | INTRAVENOUS | Status: DC | PRN

## 2017-10-09 MED ORDER — MIDAZOLAM HCL 5 MG/5ML IJ SOLN
INTRAMUSCULAR | Status: DC | PRN
  Administered 2017-10-09: 2 mg via INTRAVENOUS

## 2017-10-09 MED ORDER — MAGNESIUM CITRATE PO SOLN: 1.0000 | Freq: Once | ORAL | Status: DC | PRN

## 2017-10-09 MED ORDER — VANCOMYCIN HCL 1000 MG IV SOLR
INTRAVENOUS | Status: AC
  Filled 2017-10-09: qty 1000

## 2017-10-09 MED ORDER — HYDROCODONE-ACETAMINOPHEN 7.5-325 MG PO TABS
ORAL_TABLET | ORAL | Status: AC
  Filled 2017-10-09: qty 1

## 2017-10-09 MED ORDER — VANCOMYCIN HCL 1000 MG IV SOLR
INTRAVENOUS | Status: DC | PRN
  Administered 2017-10-09: 1000 mg via TOPICAL

## 2017-10-09 MED ORDER — METHOCARBAMOL 500 MG PO TABS
ORAL_TABLET | ORAL | Status: AC
  Filled 2017-10-09: qty 1

## 2017-10-09 MED ORDER — DIPHENHYDRAMINE HCL 12.5 MG/5ML PO ELIX: 25.0000 mg | ORAL_SOLUTION | ORAL | Status: DC | PRN

## 2017-10-09 MED ORDER — KETOROLAC TROMETHAMINE 30 MG/ML IJ SOLN
30.0000 mg | Freq: Four times a day (QID) | INTRAMUSCULAR | Status: AC | PRN
  Administered 2017-10-09 – 2017-10-11 (×2): 30 mg via INTRAVENOUS
  Filled 2017-10-09 (×2): qty 1

## 2017-10-09 MED ORDER — PROPOFOL 10 MG/ML IV BOLUS
INTRAVENOUS | Status: DC | PRN
  Administered 2017-10-09: 30 mg via INTRAVENOUS
  Administered 2017-10-09: 150 mg via INTRAVENOUS

## 2017-10-09 MED ORDER — METOCLOPRAMIDE HCL 5 MG PO TABS: 5.0000 mg | ORAL_TABLET | Freq: Three times a day (TID) | ORAL | Status: DC | PRN

## 2017-10-09 MED ORDER — SORBITOL 70 % SOLN: 30.0000 mL | Freq: Every day | Status: DC | PRN

## 2017-10-09 MED ORDER — LIDOCAINE 2% (20 MG/ML) 5 ML SYRINGE
INTRAMUSCULAR | Status: AC
  Filled 2017-10-09: qty 5

## 2017-10-09 MED ORDER — 0.9 % SODIUM CHLORIDE (POUR BTL) OPTIME
TOPICAL | Status: DC | PRN
  Administered 2017-10-09: 1000 mL

## 2017-10-09 MED ORDER — POLYETHYLENE GLYCOL 3350 17 G PO PACK: 17.0000 g | PACK | Freq: Every day | ORAL | Status: DC | PRN

## 2017-10-09 MED ORDER — METHOCARBAMOL 1000 MG/10ML IJ SOLN: 500.0000 mg | Freq: Four times a day (QID) | INTRAVENOUS | Status: DC | PRN

## 2017-10-09 MED ORDER — LACTATED RINGERS IV SOLN
INTRAVENOUS | Status: DC
  Administered 2017-10-09: 12:00:00 via INTRAVENOUS

## 2017-10-09 MED ORDER — VANCOMYCIN HCL IN DEXTROSE 1-5 GM/200ML-% IV SOLN
INTRAVENOUS | Status: AC
  Filled 2017-10-09: qty 200

## 2017-10-09 MED ORDER — PIPERACILLIN-TAZOBACTAM 3.375 G IVPB
3.3750 g | Freq: Three times a day (TID) | INTRAVENOUS | Status: DC
  Filled 2017-10-09 (×2): qty 50

## 2017-10-09 MED ORDER — MEPERIDINE HCL 50 MG/ML IJ SOLN: 6.2500 mg | INTRAMUSCULAR | Status: DC | PRN

## 2017-10-09 MED ORDER — SODIUM CHLORIDE 0.9 % IR SOLN
Status: DC | PRN
  Administered 2017-10-09: 3000 mL

## 2017-10-09 MED ORDER — METOCLOPRAMIDE HCL 5 MG/ML IJ SOLN: 5.0000 mg | Freq: Three times a day (TID) | INTRAMUSCULAR | Status: DC | PRN

## 2017-10-09 MED ORDER — ONDANSETRON HCL 4 MG/2ML IJ SOLN
INTRAMUSCULAR | Status: DC | PRN
  Administered 2017-10-09: 4 mg via INTRAVENOUS

## 2017-10-09 MED ORDER — ONDANSETRON HCL 4 MG PO TABS: 4.0000 mg | ORAL_TABLET | Freq: Four times a day (QID) | ORAL | Status: DC | PRN

## 2017-10-09 MED ORDER — HYDROCODONE-ACETAMINOPHEN 5-325 MG PO TABS
1.0000 | ORAL_TABLET | Freq: Four times a day (QID) | ORAL | Status: DC | PRN
  Administered 2017-10-10: 1 via ORAL
  Filled 2017-10-09 (×2): qty 1

## 2017-10-09 MED ORDER — FENTANYL CITRATE (PF) 100 MCG/2ML IJ SOLN
INTRAMUSCULAR | Status: AC
  Filled 2017-10-09: qty 2

## 2017-10-09 MED ORDER — ONDANSETRON HCL 4 MG/2ML IJ SOLN
INTRAMUSCULAR | Status: AC
  Filled 2017-10-09: qty 2

## 2017-10-09 MED ORDER — MIDAZOLAM HCL 2 MG/2ML IJ SOLN
INTRAMUSCULAR | Status: AC
  Filled 2017-10-09: qty 2

## 2017-10-09 MED ORDER — PROPOFOL 10 MG/ML IV BOLUS
INTRAVENOUS | Status: AC
  Filled 2017-10-09: qty 20

## 2017-10-09 MED ORDER — VANCOMYCIN HCL IN DEXTROSE 1-5 GM/200ML-% IV SOLN: 1000.0000 mg | Freq: Two times a day (BID) | INTRAVENOUS | Status: DC

## 2017-10-09 MED ORDER — METOCLOPRAMIDE HCL 5 MG/ML IJ SOLN: 10.0000 mg | Freq: Once | INTRAMUSCULAR | Status: DC | PRN

## 2017-10-09 MED ORDER — VANCOMYCIN HCL IN DEXTROSE 1-5 GM/200ML-% IV SOLN: 1000.0000 mg | INTRAVENOUS | Status: DC

## 2017-10-09 MED ORDER — VANCOMYCIN HCL 1000 MG IV SOLR
1000.0000 mg | Freq: Once | INTRAVENOUS | Status: DC
  Administered 2017-10-09: 1000 mg via INTRAVENOUS

## 2017-10-09 MED ORDER — DOCUSATE SODIUM 100 MG PO CAPS
100.0000 mg | ORAL_CAPSULE | Freq: Two times a day (BID) | ORAL | Status: DC
  Administered 2017-10-09 – 2017-10-12 (×5): 100 mg via ORAL
  Filled 2017-10-09 (×6): qty 1

## 2017-10-09 MED ORDER — PHENYLEPHRINE 40 MCG/ML (10ML) SYRINGE FOR IV PUSH (FOR BLOOD PRESSURE SUPPORT)
PREFILLED_SYRINGE | INTRAVENOUS | Status: AC
  Filled 2017-10-09: qty 10

## 2017-10-09 MED ORDER — FENTANYL CITRATE (PF) 100 MCG/2ML IJ SOLN
INTRAMUSCULAR | Status: DC | PRN
  Administered 2017-10-09: 50 ug via INTRAVENOUS
  Administered 2017-10-09 (×3): 25 ug via INTRAVENOUS
  Administered 2017-10-09: 75 ug via INTRAVENOUS

## 2017-10-09 MED ORDER — HYDROCODONE-ACETAMINOPHEN 7.5-325 MG PO TABS
1.0000 | ORAL_TABLET | Freq: Once | ORAL | Status: AC | PRN
  Administered 2017-10-09: 1 via ORAL

## 2017-10-09 MED ORDER — ASPIRIN EC 81 MG PO TBEC
81.0000 mg | DELAYED_RELEASE_TABLET | Freq: Every day | ORAL | Status: DC
  Administered 2017-10-09 – 2017-10-12 (×4): 81 mg via ORAL
  Filled 2017-10-09 (×4): qty 1

## 2017-10-09 MED ORDER — FENTANYL CITRATE (PF) 250 MCG/5ML IJ SOLN
INTRAMUSCULAR | Status: AC
  Filled 2017-10-09: qty 5

## 2017-10-09 MED ORDER — FENTANYL CITRATE (PF) 100 MCG/2ML IJ SOLN
25.0000 ug | INTRAMUSCULAR | Status: DC | PRN
  Administered 2017-10-09: 50 ug via INTRAVENOUS

## 2017-10-09 MED ORDER — ONDANSETRON HCL 4 MG/2ML IJ SOLN: 4.0000 mg | Freq: Four times a day (QID) | INTRAMUSCULAR | Status: DC | PRN

## 2017-10-09 MED ORDER — SODIUM CHLORIDE 0.9 % IV SOLN
3.0000 g | Freq: Three times a day (TID) | INTRAVENOUS | Status: DC
  Administered 2017-10-09 – 2017-10-11 (×7): 3 g via INTRAVENOUS
  Filled 2017-10-09 (×7): qty 3

## 2017-10-09 MED ORDER — SODIUM CHLORIDE 0.9 % IV SOLN
INTRAVENOUS | Status: DC
  Administered 2017-10-09 – 2017-10-10 (×2): via INTRAVENOUS

## 2017-10-09 MED ORDER — METHOCARBAMOL 500 MG PO TABS
500.0000 mg | ORAL_TABLET | Freq: Four times a day (QID) | ORAL | Status: DC | PRN
  Administered 2017-10-09 – 2017-10-12 (×6): 500 mg via ORAL
  Filled 2017-10-09 (×5): qty 1

## 2017-10-09 MED ORDER — DEXAMETHASONE SODIUM PHOSPHATE 10 MG/ML IJ SOLN
INTRAMUSCULAR | Status: DC | PRN
  Administered 2017-10-09: 10 mg via INTRAVENOUS

## 2017-10-09 MED ORDER — LIDOCAINE 2% (20 MG/ML) 5 ML SYRINGE
INTRAMUSCULAR | Status: DC | PRN
  Administered 2017-10-09: 100 mg via INTRAVENOUS

## 2017-10-09 MED ORDER — DEXAMETHASONE SODIUM PHOSPHATE 10 MG/ML IJ SOLN
INTRAMUSCULAR | Status: AC
  Filled 2017-10-09: qty 1

## 2017-10-09 SURGICAL SUPPLY — 56 items
BANDAGE ACE 3X5.8 VEL STRL LF (GAUZE/BANDAGES/DRESSINGS) IMPLANT
BANDAGE ACE 4X5 VEL STRL LF (GAUZE/BANDAGES/DRESSINGS) ×2 IMPLANT
BNDG COHESIVE 1X5 TAN STRL LF (GAUZE/BANDAGES/DRESSINGS) IMPLANT
BNDG COHESIVE 4X5 TAN STRL (GAUZE/BANDAGES/DRESSINGS) ×3 IMPLANT
BNDG COHESIVE 6X5 TAN STRL LF (GAUZE/BANDAGES/DRESSINGS) ×6 IMPLANT
BNDG CONFORM 3 STRL LF (GAUZE/BANDAGES/DRESSINGS) IMPLANT
BNDG GAUZE STRTCH 6 (GAUZE/BANDAGES/DRESSINGS) ×3 IMPLANT
CORDS BIPOLAR (ELECTRODE) IMPLANT
COVER SURGICAL LIGHT HANDLE (MISCELLANEOUS) ×3 IMPLANT
CUFF TOURNIQUET SINGLE 24IN (TOURNIQUET CUFF) IMPLANT
CUFF TOURNIQUET SINGLE 34IN LL (TOURNIQUET CUFF) IMPLANT
CUFF TOURNIQUET SINGLE 44IN (TOURNIQUET CUFF) IMPLANT
DRAPE INCISE IOBAN 66X45 STRL (DRAPES) IMPLANT
DRAPE U-SHAPE 47X51 STRL (DRAPES) ×3 IMPLANT
DURAPREP 26ML APPLICATOR (WOUND CARE) ×3 IMPLANT
ELECT REM PT RETURN 9FT ADLT (ELECTROSURGICAL)
ELECTRODE REM PT RTRN 9FT ADLT (ELECTROSURGICAL) IMPLANT
GAUZE SPONGE 4X4 12PLY STRL (GAUZE/BANDAGES/DRESSINGS) ×4 IMPLANT
GAUZE XEROFORM 5X9 LF (GAUZE/BANDAGES/DRESSINGS) ×3 IMPLANT
GLOVE BIOGEL PI IND STRL 7.0 (GLOVE) ×1 IMPLANT
GLOVE BIOGEL PI INDICATOR 7.0 (GLOVE) ×2
GLOVE ECLIPSE 7.0 STRL STRAW (GLOVE) ×3 IMPLANT
GLOVE SKINSENSE NS SZ7.5 (GLOVE) ×4
GLOVE SKINSENSE STRL SZ7.5 (GLOVE) ×2 IMPLANT
GOWN STRL REIN XL XLG (GOWN DISPOSABLE) ×6 IMPLANT
HANDPIECE INTERPULSE COAX TIP (DISPOSABLE)
KIT BASIN OR (CUSTOM PROCEDURE TRAY) ×3 IMPLANT
KIT TURNOVER KIT B (KITS) ×3 IMPLANT
MANIFOLD NEPTUNE II (INSTRUMENTS) ×3 IMPLANT
PACK ORTHO EXTREMITY (CUSTOM PROCEDURE TRAY) ×3 IMPLANT
PAD ABD 8X10 STRL (GAUZE/BANDAGES/DRESSINGS) ×3 IMPLANT
PAD ARMBOARD 7.5X6 YLW CONV (MISCELLANEOUS) ×6 IMPLANT
PAD CAST 4YDX4 CTTN HI CHSV (CAST SUPPLIES) IMPLANT
PADDING CAST ABS 4INX4YD NS (CAST SUPPLIES) ×2
PADDING CAST ABS COTTON 4X4 ST (CAST SUPPLIES) ×1 IMPLANT
PADDING CAST COTTON 4X4 STRL (CAST SUPPLIES) ×6
PADDING CAST COTTON 6X4 STRL (CAST SUPPLIES) ×3 IMPLANT
SET CYSTO W/LG BORE CLAMP LF (SET/KITS/TRAYS/PACK) ×2 IMPLANT
SET HNDPC FAN SPRY TIP SCT (DISPOSABLE) IMPLANT
SPONGE LAP 18X18 X RAY DECT (DISPOSABLE) ×3 IMPLANT
STOCKINETTE IMPERVIOUS 9X36 MD (GAUZE/BANDAGES/DRESSINGS) ×3 IMPLANT
SUT ETHILON 2 0 FS 18 (SUTURE) ×9 IMPLANT
SUT ETHILON 2 0 PSLX (SUTURE) ×3 IMPLANT
SUT ETHILON 3 0 FSL (SUTURE) ×4 IMPLANT
SUT VIC AB 2-0 FS1 27 (SUTURE) ×6 IMPLANT
SWAB COLLECTION DEVICE MRSA (MISCELLANEOUS) ×2 IMPLANT
SWAB CULTURE ESWAB REG 1ML (MISCELLANEOUS) ×2 IMPLANT
TOWEL OR 17X24 6PK STRL BLUE (TOWEL DISPOSABLE) ×3 IMPLANT
TOWEL OR 17X26 10 PK STRL BLUE (TOWEL DISPOSABLE) ×3 IMPLANT
TUBE CONNECTING 12'X1/4 (SUCTIONS) ×1
TUBE CONNECTING 12X1/4 (SUCTIONS) ×2 IMPLANT
TUBE FEEDING ENTERAL 5FR 16IN (TUBING) IMPLANT
TUBING CYSTO DISP (UROLOGICAL SUPPLIES) ×3 IMPLANT
UNDERPAD 30X30 (UNDERPADS AND DIAPERS) ×6 IMPLANT
WATER STERILE IRR 1000ML POUR (IV SOLUTION) ×1 IMPLANT
YANKAUER SUCT BULB TIP NO VENT (SUCTIONS) ×3 IMPLANT

## 2017-10-09 NOTE — Discharge Instructions (Signed)
Postoperative instructions:  Weightbearing instructions: as tolerated but limit prolonged standing or walking to minimize swelling  Keep your dressing and/or splint clean and dry at all times.  You can remove your dressing on post-operative day #3 and change with a dry/sterile dressing or Band-Aids as needed thereafter.    Incision instructions:  Do not soak your incision for 3 weeks after surgery.  If the incision gets wet, pat dry and do not scrub the incision.  Pain control:  You have been given a prescription to be taken as directed for post-operative pain control.  In addition, elevate the operative extremity above the heart at all times to prevent swelling and throbbing pain.  Take over-the-counter Colace, 100mg  by mouth twice a day while taking narcotic pain medications to help prevent constipation.  Follow up appointments: 1) 7 days for wound recheck   -------------------------------------------------------------------------------------------------------------  After Surgery Pain Control:  After your surgery, post-surgical discomfort or pain is likely. This discomfort can last several days to a few weeks. At certain times of the day your discomfort may be more intense.  Did you receive a nerve block?  A nerve block can provide pain relief for one hour to two days after your surgery. As long as the nerve block is working, you will experience little or no sensation in the area the surgeon operated on.  As the nerve block wears off, you will begin to experience pain or discomfort. It is very important that you begin taking your prescribed pain medication before the nerve block fully wears off. Treating your pain at the first sign of the block wearing off will ensure your pain is better controlled and more tolerable when full-sensation returns. Do not wait until the pain is intolerable, as the medicine will be less effective. It is better to treat pain in advance than to try and catch up.    General Anesthesia:  If you did not receive a nerve block during your surgery, you will need to start taking your pain medication shortly after your surgery and should continue to do so as prescribed by your surgeon.  Pain Medication:  Most commonly we prescribe Vicodin and Percocet for post-operative pain. Both of these medications contain a combination of acetaminophen (Tylenol) and a narcotic to help control pain.   It takes between 30 and 45 minutes before pain medication starts to work. It is important to take your medication before your pain level gets too intense.   Nausea is a common side effect of many pain medications. You will want to eat something before taking your pain medicine to help prevent nausea.   If you are taking a prescription pain medication that contains acetaminophen, we recommend that you do not take additional over the counter acetaminophen (Tylenol).  Other pain relieving options:   Using a cold pack to ice the affected area a few times a day (15 to 20 minutes at a time) can help to relieve pain, reduce swelling and bruising.   Elevation of the affected area can also help to reduce pain and swelling.

## 2017-10-09 NOTE — Progress Notes (Signed)
Orthopedic Tech Progress Note Patient Details:  Madison Bryant May 14, 1955 [id number]  Ortho Devices Type of Ortho Device: Postop shoe/boot Ortho Device/Splint Location: lle Ortho Device/Splint Interventions: Application   Post Interventions Patient Tolerated: Well Instructions Provided: Care of device   Hildred Priest 10/09/2017, 2:58 PM

## 2017-10-09 NOTE — Op Note (Signed)
   Date of Surgery: 10/09/2017  INDICATIONS: Ms. Blakley is a 62 y.o.-year-old female with a left foot abscess from a cat bite who failed oral antibiotics;  The patient did consent to the procedure after discussion of the risks and benefits.  PREOPERATIVE DIAGNOSIS: Left foot abscess and infectious extensor tenosynovitis of EHL tendon  POSTOPERATIVE DIAGNOSIS: Same.  PROCEDURE:  1. Incision and drainage of deep left foot abscess 2. Decompression of EHL tendon sheath 3. Tenolysis of EHL tendon 4. Complex wound repair of left foot 3 cm  SURGEON: N. Eduard Roux, M.D.  ASSIST: Ciro Backer [place], [place]; necessary for the timely completion of procedure and due to complexity of procedure.  ANESTHESIA:  general  IV FLUIDS AND URINE: See anesthesia.  ESTIMATED BLOOD LOSS: minimal mL.  IMPLANTS: none  DRAINS: none  COMPLICATIONS: None.  DESCRIPTION OF PROCEDURE: The patient was brought to the operating room and placed supine on the operating table.  The patient had been signed prior to the procedure and this was documented. The patient had the anesthesia placed by the anesthesiologist.  A time-out was performed to confirm that this was the correct patient, site, side and location. The patient did receive antibiotics prior to the incision and was re-dosed during the procedure as needed at indicated intervals.  A tourniquet was placed.  The patient had the operative extremity prepped and draped in the standard surgical fashion.    A longitudinal incision based over the first metatarsal was made.  The puncture wound was fully excised.  Blunt dissection was brought down to the extensor tendon sheath.  Sharp excisional debridement of the skin, subcutaneous tissue, tendon, muscle was performed using a rondure and curette and knife.  There was significant tunneling in the subcutaneous layer towards the lateral side of the foot.  There is evidence of infection of the extensor tendon sheath of the EHL.   The tendon sheath of the EHL was fully released along its path in the foot and toe.  Sharp excisional debridement of the tendon was performed gently with a rongeur.  Tenolysis was performed for the EHL tendon with Metzenbaum scissors.  After thorough debridement 6 L of normal saline were irrigated through the wound.  I then placed 1 g of vancomycin powder throughout the surgical tissue bed.  The tourniquet was deflated and all tissues exhibited good bleeding.  Hemostasis was obtained.  Complex wound repair was performed with interrupted 3-0 nylon sutures.  Skin was loosely approximated.  Sterile dressings were applied.  Patient tolerated procedure well had no major complications.  POSTOPERATIVE PLAN: Patient will be nonweightbearing to the left lower extremity.  She will need to remain on antibiotics until culture speciation.  We will consult infectious diseases to help guide [place] in antibiotic treatment.  Azucena Cecil, MD [place] [phone] 2:30 PM

## 2017-10-09 NOTE — Anesthesia Procedure Notes (Signed)
Procedure Name: LMA Insertion Date/Time: 10/09/2017 1:51 PM Performed by: Kyung Rudd, CRNA Pre-anesthesia Checklist: Patient identified, Emergency Drugs available, Suction available and Patient being monitored Patient Re-evaluated:Patient Re-evaluated prior to induction Oxygen Delivery Method: Circle system utilized Preoxygenation: Pre-oxygenation with 100% oxygen Induction Type: IV induction Ventilation: Mask ventilation without difficulty LMA: LMA inserted LMA Size: 4.0 Number of attempts: 1 Placement Confirmation: positive ETCO2 and breath sounds checked- equal and bilateral Tube secured with: Tape Dental Injury: Teeth and Oropharynx as per pre-operative assessment

## 2017-10-09 NOTE — Progress Notes (Signed)
Telephone verbal order given from  Dr. Erlinda Hong to add the following orders:  - Toradol 30 mg for 48 hours as needed for pain. - Norco 1 to 2 tablets every 6 hours as needed for pain. - Morphine 1 mg every 3 hours for break through pain as needed.

## 2017-10-09 NOTE — Anesthesia Preprocedure Evaluation (Addendum)
Anesthesia Evaluation  Patient identified by MRN, date of birth, ID band Patient awake    Reviewed: Allergy & Precautions, NPO status , Patient's Chart, lab work & pertinent test results  History of Anesthesia Complications (+) PONV and history of anesthetic complications  Airway Mallampati: III  TM Distance: >3 FB Neck ROM: Full    Dental no notable dental hx. (+) Teeth Intact, Dental Advisory Given   Pulmonary asthma ,    Pulmonary exam normal breath sounds clear to auscultation       Cardiovascular negative cardio ROS Normal cardiovascular exam Rhythm:Regular Rate:Normal     Neuro/Psych negative neurological ROS  negative psych ROS   GI/Hepatic Neg liver ROS, GERD  Controlled and Medicated,  Endo/Other  negative endocrine ROS  Renal/GU negative Renal ROS  negative genitourinary   Musculoskeletal  (+) Arthritis , Cellulitis left foot S/P ORIF Left ankle   Abdominal   Peds  Hematology negative hematology ROS (+)   Anesthesia Other Findings   Reproductive/Obstetrics                            Anesthesia Physical Anesthesia Plan  ASA: II  Anesthesia Plan: General   Post-op Pain Management:    Induction: Intravenous  PONV Risk Score and Plan: 4 or greater and Scopolamine patch - Pre-op, Midazolam, Dexamethasone, Ondansetron and Treatment may vary due to age or medical condition  Airway Management Planned: LMA  Additional Equipment:   Intra-op Plan:   Post-operative Plan: Extubation in OR  Informed Consent: I have reviewed the patients History and Physical, chart, labs and discussed the procedure including the risks, benefits and alternatives for the proposed anesthesia with the patient or authorized representative who has indicated his/her understanding and acceptance.   Dental advisory given  Plan Discussed with: CRNA and Surgeon  Anesthesia Plan Comments:          Anesthesia Quick Evaluation

## 2017-10-09 NOTE — Progress Notes (Signed)
ANTIBIOTIC CONSULT NOTE - INITIAL  Pharmacy Consult for Vancomycin and zosyn Indication: Wound infection  Allergies  Allergen Reactions  . E-Mycin [Erythromycin]     GI UPSET    Patient Measurements: Height: 5\' 7"  (170.2 cm) Weight: 173 lb (78.5 kg) IBW/kg (Calculated) : 61.6   Vital Signs: Temp: 98 F (36.7 C) (05/31 1534) Temp Source: Oral (05/31 1534) BP: 123/66 (05/31 1534) Pulse Rate: 81 (05/31 1534) Intake/Output from previous day: No intake/output data recorded. Intake/Output from this shift: Total I/O In: 600 [P.O.:200; I.V.:400] Out: 10 [Blood:10]  Labs: Recent Labs    10/09/17 1157  WBC 5.6  HGB 12.9  PLT 333  CREATININE 0.76   Estimated Creatinine Clearance: 81.8 mL/min (by C-G formula based on SCr of 0.76 mg/dL). No results for input(s): VANCOTROUGH, VANCOPEAK, VANCORANDOM, GENTTROUGH, GENTPEAK, GENTRANDOM, TOBRATROUGH, TOBRAPEAK, TOBRARND, AMIKACINPEAK, AMIKACINTROU, AMIKACIN in the last 72 hours.   Microbiology: No results found for this or any previous visit (from the past 720 hour(s)).  Medical History: Past Medical History:  Diagnosis Date  . Allergy   . Ankle fracture    left  . Arthritis    fingers, knees and hips  . Asthma   . Cellulitis    left foot  . GERD (gastroesophageal reflux disease)    OTC med prn  . Osteoporosis   . PONV (postoperative nausea and vomiting)   . Wears glasses      Assessment: 62 yo female with left foot abscess from cat bite which has failed oral abx. Patient is s/p I&D and is to be initiated on IV vancomycin and zosyn per pharmacy. CrCL ~81 ml/min. Patient received a vancomycin dose in the OR at ~1400. WBC WNL, afebrile  Goal of Therapy:  Vancomycin troughs 10-15 mcg/ml  Plan:  Vancomycin 1000mg  q12h Zosyn 3.375gm IV q8h (Extended infusion) Monitor vancomycin troughs as indicated Monitor signs of infection, CBC, clinical course, and fever curve.   Olie Dibert A. Levada Dy, PharmD, [place] Pager: [phone]  10/09/2017,4:12 PM

## 2017-10-09 NOTE — H&P (Signed)
PREOPERATIVE H&P  Chief Complaint: left foot cellulitis  HPI: Madison Bryant is a 62 y.o. female who presents for surgical treatment of left foot cellulitis.  She denies any changes in medical history.  Past Medical History:  Diagnosis Date  . Allergy   . Ankle fracture    left  . Arthritis    fingers, knees and hips  . Asthma   . Cellulitis    left foot  . GERD (gastroesophageal reflux disease)    OTC med prn  . Osteoporosis   . PONV (postoperative nausea and vomiting)   . Wears glasses    Past Surgical History:  Procedure Laterality Date  . ABDOMINAL HYSTERECTOMY    . ORIF FIBULA FRACTURE Left 08/21/2016   Procedure: OPEN REDUCTION INTERNAL FIXATION (ORIF) DISTAL FIBULA FRACTURE;  Surgeon: Ninetta Lights, MD;  Location: [facility];  Service: Orthopedics;  Laterality: Left;  . ROTATOR CUFF REPAIR Right   . TONSILLECTOMY    . TUBAL LIGATION     Social History   Socioeconomic History  . Marital status: Married    Spouse name: Not on file  . Number of children: Not on file  . Years of education: Not on file  . Highest education level: Not on file  Occupational History  . Not on file  Social Needs  . Financial resource strain: Not on file  . Food insecurity:    Worry: Not on file    Inability: Not on file  . Transportation needs:    Medical: Not on file    Non-medical: Not on file  Tobacco Use  . Smoking status: Never Smoker  . Smokeless tobacco: Never Used  Substance and Sexual Activity  . Alcohol use: No  . Drug use: No  . Sexual activity: Yes  Lifestyle  . Physical activity:    Days per week: Not on file    Minutes per session: Not on file  . Stress: Not on file  Relationships  . Social connections:    Talks on phone: Not on file    Gets together: Not on file    Attends religious service: Not on file    Active member of club or organization: Not on file    Attends meetings of clubs or organizations: Not on file   Relationship status: Not on file  Other Topics Concern  . Not on file  Social History Narrative  . Not on file   Family History  Problem Relation Age of Onset  . Miscarriages / Stillbirths Mother    Allergies  Allergen Reactions  . E-Mycin [Erythromycin]     GI UPSET   Prior to Admission medications   Medication Sig Start Date End Date Taking? Authorizing Provider  amoxicillin-clavulanate (AUGMENTIN) 875-125 MG tablet Take 1 tablet by mouth 2 (two) times daily.    [provider]  celecoxib (CELEBREX) 200 MG capsule Take 1 capsule (200 mg total) by mouth daily. 12/26/16   Susy Frizzle, MD  doxycycline (VIBRA-TABS) 100 MG tablet Take 1 tablet (100 mg total) by mouth 2 (two) times daily. 10/07/17   Leandrew Koyanagi, MD  escitalopram (LEXAPRO) 20 MG tablet Take 1 tablet (20 mg total) by mouth daily. 07/23/17   Susy Frizzle, MD  esomeprazole (NEXIUM) 20 MG capsule Take 20 mg by mouth daily at 12 noon.    [provider]  fexofenadine (ALLEGRA) 180 MG tablet Take 180 mg by mouth daily.    [provider]  Multiple Vitamin (MULTIVITAMIN WITH MINERALS) TABS tablet Take 1 tablet by mouth daily.    [provider]  mupirocin ointment (BACTROBAN) 2 % Place 1 application into the nose 3 (three) times daily.    [provider]     Positive ROS: All other systems have been reviewed and were otherwise negative with the exception of those mentioned in the HPI and as above.  Physical Exam: General: Alert, no acute distress Cardiovascular: No pedal edema Respiratory: No cyanosis, no use of accessory musculature GI: abdomen soft Skin: No lesions in the area of chief complaint Neurologic: Sensation intact distally Psychiatric: Patient is competent for consent with normal mood and affect Lymphatic: no lymphedema  MUSCULOSKELETAL: exam stable  Assessment: left foot cellulitis  Plan: Plan for Procedure(s): IRRIGATION AND DEBRIDEMENT LEFT  FOOT  The risks benefits and alternatives were discussed with the patient including but not limited to the risks of nonoperative treatment, versus surgical intervention including infection, bleeding, nerve injury,  blood clots, cardiopulmonary complications, morbidity, mortality, among others, and they were willing to proceed.   Eduard Roux, MD   10/09/2017 8:16 AM

## 2017-10-09 NOTE — Transfer of Care (Signed)
Immediate Anesthesia Transfer of Care Note  Patient: Madison Bryant  Procedure(s) Performed: IRRIGATION AND DEBRIDEMENT LEFT FOOT (Left )  Patient Location: PACU  Anesthesia Type:General  Level of Consciousness: awake, alert  and oriented  Airway & Oxygen Therapy: Patient Spontanous Breathing and Patient connected to nasal cannula oxygen  Post-op Assessment: Report given to RN, Post -op Vital signs reviewed and stable and Patient moving all extremities X 4  Post vital signs: Reviewed and stable  Last Vitals:  Vitals Value Taken Time  BP 134/94 10/09/2017  2:47 PM  Temp    Pulse 110 10/09/2017  2:47 PM  Resp 15 10/09/2017  2:47 PM  SpO2 98 % 10/09/2017  2:47 PM  Vitals shown include unvalidated device data.  Last Pain:  Vitals:   10/09/17 1155  TempSrc:   PainSc: 6       Patients Stated Pain Goal: 8 (82/50/03 7048)  Complications: No apparent anesthesia complications

## 2017-10-09 NOTE — Consult Note (Signed)
[facility] for Infectious Disease  Total days of antibiotics 1               Reason for Consult: cat bite    Referring Physician: xu  Active Problems:   Foot abscess, left    HPI: Madison Bryant is a 62 y.o. female  With hx of GERD, arthritis. 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 Over the course of the next few days it became increasing swollen,red, and painful to ambulate. She was initially started on amox/clav but continued to progress to dorsum of foot with difficulty moving toes.she started on doxycycline by dr Erlinda Hong yesterday. She was admitted for wash out which dr Erlinda Hong performed I x D of deep left foot abscess and decompression of tendon sheath. The antique store owner's cat is now in quarantine by animal control since it was not updated on its vaccine. She states that she thought that the cat appeared healthy/friendly since she had pet it prior to the biting incident. Post op ahs some soreness to her foot as expected but feels that she has less pressure. She has shown me series of pictures to correspond to onset of her symptoms.  Past Medical History:  Diagnosis Date  . Allergy   . Ankle fracture    left  . Arthritis    fingers, knees and hips  . Asthma   . Cellulitis    left foot  . GERD (gastroesophageal reflux disease)    OTC med prn  . Osteoporosis   . PONV (postoperative nausea and vomiting)   . Wears glasses     Allergies:  Allergies  Allergen Reactions  . E-Mycin [Erythromycin]     GI UPSET     MEDICATIONS: . aspirin EC  81 mg Oral Daily  . docusate sodium  100 mg Oral BID  . fentaNYL      . HYDROcodone-acetaminophen      . methocarbamol        Social History   Tobacco Use  . Smoking status: Never Smoker  . Smokeless tobacco: Never Used  Substance Use Topics  . Alcohol use: No  . Drug use: No    Family History    Problem Relation Age of Onset  . Miscarriages / [place] Mother     Review of Systems  Constitutional: Negative for fever, chills, diaphoresis, activity change, appetite change, fatigue and unexpected weight change.  HENT: Negative for congestion, sore throat, rhinorrhea, sneezing, trouble swallowing and sinus pressure.  Eyes: Negative for photophobia and visual disturbance.  Respiratory: Negative for cough, chest tightness, shortness of breath, wheezing and stridor.  Cardiovascular: Negative for chest pain, palpitations and leg swelling.  Gastrointestinal: Negative for nausea, vomiting, abdominal pain, diarrhea, constipation, blood in stool, abdominal distention and anal bleeding.  Genitourinary: Negative for dysuria, hematuria, flank pain and difficulty urinating.  Musculoskeletal: Negative for myalgias, back pain, joint swelling, arthralgias and gait problem.  Skin: + rash per hpi Neurological: Negative for dizziness, tremors, weakness and light-headedness.  Hematological: Negative for adenopathy. Does not bruise/bleed easily.  Psychiatric/Behavioral: Negative for behavioral problems, confusion, sleep disturbance, dysphoric mood, decreased concentration and agitation.     OBJECTIVE: Temp:  [97.8 F (36.6 C)-98.2 F (36.8 C)] 98 F (36.7 C) (05/31 1534) Pulse Rate:  [79-110] 81 (05/31 1534) Resp:  [13-20] 18 (05/31 1534) BP: (123-134)/(66-94) 123/66 (05/31  1534) SpO2:  [98 %-100 %] 99 % (05/31 1534) Weight:  [173 lb (78.5 kg)] 173 lb (78.5 kg) (05/31 1134) Physical Exam  Constitutional:  oriented to person, place, and time. appears well-developed and well-nourished. No distress.  HENT: [place]/AT, PERRLA, no scleral icterus Mouth/Throat: Oropharynx is clear and moist. No oropharyngeal exudate.  Cardiovascular: Normal rate, regular rhythm and normal heart sounds. Exam reveals no gallop and no friction rub.  No murmur heard.  Pulmonary/Chest: Effort normal and breath sounds  normal. No respiratory distress.  has no wheezes.  Neck = supple, no nuchal rigidity Abdominal: Soft. Bowel sounds are normal.  exhibits no distension. There is no tenderness.  Lymphadenopathy: no cervical adenopathy. No axillary adenopathy Neurological: alert and oriented to person, place, and time.  Ext: left foot is wrapped, unable to see any erythema outside of bandaged foot Psychiatric: a normal mood and affect.  behavior is normal.    LABS: Results for orders placed or performed during the hospital encounter of 10/09/17 (from the past 48 hour(s))  Sedimentation rate     Status: Abnormal   Collection Time: 10/09/17 11:57 AM  Result Value Ref Range   Sed Rate 59 (H) 0 - 22 mm/hr    Comment: Performed at [facility] [address] [place], [place] [address]  CBC     Status: None   Collection Time: 10/09/17 11:57 AM  Result Value Ref Range   WBC 5.6 4.0 - 10.5 K/uL   RBC 4.45 3.87 - 5.11 MIL/uL   Hemoglobin 12.9 12.0 - 15.0 g/dL   HCT 39.3 36.0 - 46.0 %   MCV 88.3 78.0 - 100.0 fL   MCH 29.0 26.0 - 34.0 pg   MCHC 32.8 30.0 - 36.0 g/dL   RDW 12.0 11.5 - 15.5 %   Platelets 333 150 - 400 K/uL    Comment: Performed at [facility], [place] [address], [place], [place] [address]  C-reactive protein     Status: Abnormal   Collection Time: 10/09/17 11:57 AM  Result Value Ref Range   CRP 5.6 (H) <1.0 mg/dL    Comment: Performed at [facility] [address] [place], [place] [address]  Basic metabolic panel     Status: Abnormal   Collection Time: 10/09/17 11:57 AM  Result Value Ref Range   Sodium 141 135 - 145 mmol/L   Potassium 3.8 3.5 - 5.1 mmol/L   Chloride 106 101 - 111 mmol/L   CO2 26 22 - 32 mmol/L   Glucose, Bld 105 (H) 65 - 99 mg/dL   BUN 12 6 - 20 mg/dL   Creatinine, Ser 0.76 0.44 - 1.00 mg/dL   Calcium 9.2 8.9 - 10.3 mg/dL   GFR calc non Af Amer >60 >60 mL/min   GFR calc Af Amer >60 >60 mL/min    Comment: (NOTE) The eGFR has been calculated using  the CKD EPI equation. This calculation has not been validated in all clinical situations. eGFR's persistently <60 mL/min signify possible Chronic Kidney Disease.    Anion gap 9 5 - 15    Comment: Performed at [facility] [address] [place], [place] [address]    MICRO: 5/31 gram stain wbc, no organism. Cultures are pending IMAGING: Foot xray shows diffuse swelling  Assessment/Plan: 62yo F who sustained cat bite on 5/24 failed oral abtx admitted for IXD and Iv abtx. ddx includes pasteurella, possibly capnocytophagia, as well as staphylococcus from bacteria colonization entering wound.  I am concerned that it may have cause septic arthritis based on initial photo but does not appear to be the case based upon or report. -- appears to be deep tissue with septic tenosynovitis  - will continue with amp/sub for now (can d/c vanco and piptazo) she does not have hx of mrsa infection  - will need to follow up with animal control to decide if need to consider possible rabies exposure. Typically animals are quarantined for 10 days. Currently day 7 from bite and cat is reportedly healthy. For now, I think we can hold on rabies post exposure prophylaxis

## 2017-10-10 DIAGNOSIS — M65172 Other infective (teno)synovitis, left ankle and foot: Secondary | ICD-10-CM

## 2017-10-10 NOTE — Progress Notes (Signed)
    [facility] for Infectious Disease    Date of Admission:  10/09/2017   Total days of antibiotics 2           ID: Madison Bryant is a 62 y.o. female with  Cat bite foot infection Active Problems:   Foot abscess, left    Subjective: She feels like she is having able to flex her toes without difficulty. Less swelling. No fevers, minimal pain  Medications:  . aspirin EC  81 mg Oral Daily  . docusate sodium  100 mg Oral BID    Objective: Vital signs in last 24 hours: Temp:  [98 F (36.7 C)-98.9 F (37.2 C)] 98.1 F (36.7 C) (06/01 1408) Pulse Rate:  [72-91] 91 (06/01 1408) Resp:  [16-18] 16 (06/01 1408) BP: (104-118)/(54-75) 117/61 (06/01 1408) SpO2:  [97 %-100 %] 100 % (06/01 1408) Physical Exam  Constitutional:  oriented to person, place, and time. appears well-developed and well-nourished. No distress.  HENT: [place]/AT, PERRLA, no scleral icterus Mouth/Throat: Oropharynx is clear and moist. No oropharyngeal exudate.  Cardiovascular: Normal rate, regular rhythm and normal heart sounds. Exam reveals no gallop and no friction rub.  No murmur heard.  Pulmonary/Chest: Effort normal and breath sounds normal. No respiratory distress.  has no wheezes.  Neck = supple, no nuchal rigidity Abdominal: Soft. Bowel sounds are normal.  exhibits no distension. There is no tenderness.  Ext: left lower leg is wrapped. Skin: Skin is warm and dry. No rash noted. No erythema. Onychomycosis of toes Psychiatric: a normal mood and affect.  behavior is normal.    Lab Results Recent Labs    10/09/17 1157  WBC 5.6  HGB 12.9  HCT 39.3  NA 141  K 3.8  CL 106  CO2 26  BUN 12  CREATININE 0.76   Sedimentation Rate Recent Labs    10/09/17 1157  ESRSEDRATE 59*   C-Reactive Protein Recent Labs    10/09/17 1157  CRP 5.6*    Microbiology: 5/31 cx = pending, some growth but taking time to identify Studies/Results: No results found.   Assessment/Plan: Left foot deep tissue  abscess/infectious tenosynovitis  S/p debridement = continue on amp/sub. Await identification from culture and how she is improving to decide on whether she needs Iv abtx. More input tomorrow  Health maintenance =will check hep C and hiv ab x [facility] for Infectious Diseases Cell: [phone] Pager: [phone]  10/10/2017, 3:41 PM

## 2017-10-10 NOTE — Progress Notes (Signed)
Patient ID: Madison Bryant, female   DOB: [birth year], 62 y.o.   MRN: [id number] Postoperative day 1 irrigation and debridement left foot for abscess and infection.  The dressing was clean and dry patient's pain has improved.  Continue IV antibiotics no dressing changes at this time.

## 2017-10-11 ENCOUNTER — Inpatient Hospital Stay: Payer: Self-pay

## 2017-10-11 DIAGNOSIS — A280 Pasteurellosis: Secondary | ICD-10-CM

## 2017-10-11 DIAGNOSIS — M65972 Unspecified synovitis and tenosynovitis, left ankle and foot: Secondary | ICD-10-CM

## 2017-10-11 DIAGNOSIS — W5501XA Bitten by cat, initial encounter: Secondary | ICD-10-CM

## 2017-10-11 DIAGNOSIS — M659 Synovitis and tenosynovitis, unspecified: Secondary | ICD-10-CM

## 2017-10-11 LAB — HIV ANTIBODY (ROUTINE TESTING W REFLEX): HIV Screen 4th Generation wRfx: NONREACTIVE

## 2017-10-11 MED ORDER — SODIUM CHLORIDE 0.9 % IV SOLN
2.0000 g | INTRAVENOUS | Status: DC
  Administered 2017-10-11: 2 g via INTRAVENOUS
  Filled 2017-10-11 (×2): qty 20

## 2017-10-11 MED ORDER — SODIUM CHLORIDE 0.9% FLUSH
10.0000 mL | INTRAVENOUS | Status: DC | PRN
  Administered 2017-10-12: 10 mL
  Filled 2017-10-11: qty 40

## 2017-10-11 NOTE — Progress Notes (Signed)
PHARMACY CONSULT NOTE FOR:  OUTPATIENT  PARENTERAL ANTIBIOTIC THERAPY (OPAT)  Indication: pasteurella infection with tenosynovitis Regimen: Ceftriaxone 2g IV q24h End date: 10/22/17  IV antibiotic discharge orders are pended. To discharging provider:  please sign these orders via discharge navigator,  Select New Orders & click on the button choice - Manage This Unsigned Work.     Thank you for allowing pharmacy to be a part of this patient's care.  Madison Bryant 10/11/2017, 4:20 PM

## 2017-10-11 NOTE — Progress Notes (Signed)
Peripherally Inserted Central Catheter/Midline Placement  The IV Nurse has discussed with the patient and/or persons authorized to consent for the patient, the purpose of this procedure and the potential benefits and risks involved with this procedure.  The benefits include less needle sticks, lab draws from the catheter, and the patient may be discharged home with the catheter. Risks include, but not limited to, infection, bleeding, blood clot (thrombus formation), and puncture of an artery; nerve damage and irregular heartbeat and possibility to perform a PICC exchange if needed/ordered by physician.  Alternatives to this procedure were also discussed.  Bard Power PICC patient education guide, fact sheet on infection prevention and patient information card has been provided to patient /or left at bedside.    PICC/Midline Placement Documentation  PICC Single Lumen 10/11/17 PICC Right Brachial 38 cm 0 cm (Active)  Indication for Insertion or Continuance of Line Home intravenous therapies (PICC only) 10/11/2017  6:48 PM  Exposed Catheter (cm) 0 cm 10/11/2017  6:48 PM  Site Assessment Clean;Dry;Intact 10/11/2017  6:48 PM  Line Status Flushed;Saline locked;Blood return noted 10/11/2017  6:48 PM  Dressing Type Transparent 10/11/2017  6:48 PM  Dressing Status Clean;Dry;Intact;Antimicrobial disc in place 10/11/2017  6:48 PM  Line Care Connections checked and tightened 10/11/2017  6:48 PM  Line Adjustment (NICU/IV Team Only) No 10/11/2017  6:48 PM  Dressing Intervention New dressing 10/11/2017  6:48 PM  Dressing Change Due 10/18/17 10/11/2017  6:48 PM       Rolena Infante 10/11/2017, 6:49 PM

## 2017-10-11 NOTE — Progress Notes (Signed)
.  Patient ID: Madison Bryant, female   DOB: Oct 30, 1955, 62 y.o.   MRN: [id number]   Patient is status post irrigation and debridement left foot infection from cat bite.  The cultures are still pending.  Continue IV antibiotics.  Patient has no complaints this morning.

## 2017-10-11 NOTE — Progress Notes (Addendum)
    [facility] for Infectious Disease    Date of Admission:  10/09/2017   Total days of antibiotics 3           ID: Madison Bryant is a 62 y.o. female with cat bite to left foot, failed oral therapy, s/p I x D, cx showing pasteurella Active Problems:   Foot abscess, left    Subjective: Afebrile. She feels like her foot is less swollen, though bandages from OR have not been removed  No fevers, chills, diarrhea  Medications:  . aspirin EC  81 mg Oral Daily  . docusate sodium  100 mg Oral BID    Objective: Vital signs in last 24 hours: Temp:  [98.1 F (36.7 C)-98.7 F (37.1 C)] 98.7 F (37.1 C) (06/02 1228) Pulse Rate:  [72-84] 73 (06/02 1228) Resp:  [16] 16 (06/02 1228) BP: (109-128)/(59-72) 109/64 (06/02 1228) SpO2:  [95 %-100 %] 95 % (06/02 1228) Physical Exam  Constitutional:  oriented to person, place, and time. appears well-developed and well-nourished. No distress.  HENT: [place]/AT, PERRLA, no scleral icterus Mouth/Throat: Oropharynx is clear and moist. No oropharyngeal exudate.  Ext = less swelling to toes and base of foot Neurological: alert and oriented to person, place, and time. Moves all toes on left foot Skin: Skin is warm and dry. Decreased erythema to exposed skin Psychiatric: a normal mood and affect.  behavior is normal.   Lab Results Recent Labs    10/09/17 1157  WBC 5.6  HGB 12.9  HCT 39.3  NA 141  K 3.8  CL 106  CO2 26  BUN 12  CREATININE 0.76   Sedimentation Rate Recent Labs    10/09/17 1157  ESRSEDRATE 59*   C-Reactive Protein Recent Labs    10/09/17 1157  CRP 5.6*    Microbiology: 5/31 wound cx from OR - pasteurella multicoda Studies/Results: No results found.   Assessment/Plan: Deep tissue infection with tenosynovitis of left foot 2/2 cat bite/with pasteurella infection. Currently on day 3 of Iv abtx  - will d/c amp/sub and change to ceftriaxone 2gm IV daily - recommend to give IV abtx, ceftriaxone 2gm IV daily x 2  wks, needs to be discharged on 10 more days of IV abtx then transition to orals - will place picc line order plus OPAT order for her antibiotics -defer wound care recs to Dr Jamelle Haring team - we will see her back in the ID clinic 2 wk time to transition to oral abtx  Pasteurella cellulitis = improved  Will sign off. Call if questions.  [facility] for Infectious Diseases Cell: [phone] Pager: [phone]  10/11/2017, 4:04 PM

## 2017-10-12 ENCOUNTER — Encounter (HOSPITAL_COMMUNITY): Payer: Self-pay | Admitting: Orthopaedic Surgery

## 2017-10-12 LAB — HEPATITIS C ANTIBODY

## 2017-10-12 MED ORDER — HEPARIN SOD (PORK) LOCK FLUSH 100 UNIT/ML IV SOLN
250.0000 [IU] | INTRAVENOUS | Status: AC | PRN
  Administered 2017-10-12: 250 [IU]

## 2017-10-12 MED ORDER — CEFTRIAXONE IV (FOR PTA / DISCHARGE USE ONLY): 2.0000 g | INTRAVENOUS | 0 refills | Status: DC

## 2017-10-12 MED ORDER — ONDANSETRON HCL 4 MG PO TABS: 4.0000 mg | ORAL_TABLET | Freq: Three times a day (TID) | ORAL | 0 refills | Status: DC | PRN

## 2017-10-12 MED ORDER — TRAMADOL HCL 50 MG PO TABS: 50.0000 mg | ORAL_TABLET | Freq: Three times a day (TID) | ORAL | 0 refills | Status: DC | PRN

## 2017-10-12 NOTE — Care Management Note (Signed)
Case Management Note  Patient Details  Name: SACHIKO METHOT MRN: [id number] Date of Birth: [birth year]  Subjective/Objective:    62 yr old female admitted with left foot cellulitis due to a cat bite. Patient underwent I & D of left foot on 10/09/17.                Action/Plan:  Case manager spoke with patient and her husband concerning discharge plan. Patient will go home on IV ceftriaxone for 2 weeks. Referral called to [place], [facility]. Patient will have family support at discharge.    Expected Discharge Date:  10/12/17               Expected Discharge Plan:  [place]  In-House Referral:  NA  Discharge planning Services  CM Consult  Post Acute Care Choice:  Home Health Choice offered to:  Patient, Spouse  DME Arranged:  IV pump/equipment DME Agency:  [facility] Arranged:  RN [facility] Agency:  [facility]  Status of Service:  Completed, signed off  If discussed at [place] of Stay Meetings, dates discussed:    Additional Comments:  Ninfa Meeker, RN 10/12/2017, 10:53 AM

## 2017-10-12 NOTE — Discharge Summary (Signed)
Patient ID: Madison Bryant MRN: [id number] DOB/AGE: 1955/11/24 62 y.o.  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 Allergy   . Ankle fracture    left  . Arthritis    fingers, knees and hips  . Asthma   . Cellulitis    left foot  . GERD (gastroesophageal reflux disease)    OTC med prn  . Osteoporosis   . PONV (postoperative nausea and vomiting)   . Wears glasses     Surgeries: Procedure(s): IRRIGATION AND DEBRIDEMENT LEFT FOOT on 10/09/2017   Consultants:   Discharged Condition: Improved  Hospital Course: Madison Bryant is an 62 y.o. female who was admitted 10/09/2017 for operative treatment of left foot abscess. Patient has severe unremitting pain that affects sleep, daily activities, and work/hobbies. After pre-op clearance the patient was taken to the operating room on 10/09/2017 and underwent  Procedure(s): IRRIGATION AND DEBRIDEMENT LEFT FOOT.    Patient was given perioperative antibiotics:  Anti-infectives (From admission, onward)   Start     Dose/Rate Route Frequency Ordered Stop   10/12/17 0000  cefTRIAXone (ROCEPHIN) IVPB     2 g Intravenous Every 24 hours 10/12/17 1028     10/11/17 1700  cefTRIAXone (ROCEPHIN) 2 g in sodium chloride 0.9 % 100 mL IVPB     2 g 200 mL/hr over 30 Minutes Intravenous Every 24 hours 10/11/17 1616     10/10/17 0200  vancomycin (VANCOCIN) IVPB 1000 mg/200 mL premix  Status:  Discontinued     1,000 mg 200 mL/hr over 60 Minutes Intravenous Every 12 hours 10/09/17 1617 10/09/17 1620   10/09/17 1630  piperacillin-tazobactam (ZOSYN) IVPB 3.375 g  Status:  Discontinued     3.375 g 12.5 mL/hr over 240 Minutes Intravenous Every 8 hours 10/09/17 1617 10/09/17 1620   10/09/17 1630  Ampicillin-Sulbactam (UNASYN) 3 g  in sodium chloride 0.9 % 100 mL IVPB  Status:  Discontinued     3 g 200 mL/hr over 30 Minutes Intravenous Every 8 hours 10/09/17 1620 10/11/17 1616   10/09/17 1453  vancomycin (VANCOCIN) powder  Status:  Discontinued       As needed 10/09/17 1453 10/09/17 1453   10/09/17 1430  vancomycin (VANCOCIN) IVPB 1000 mg/200 mL premix  Status:  Discontinued     1,000 mg 200 mL/hr over 60 Minutes Intravenous To Surgery 10/09/17 1423 10/09/17 1518   10/09/17 1408  vancomycin (VANCOCIN) 1,000 mg in sodium chloride 0.9 % 250 mL IVPB  Status:  Discontinued     1,000 mg 250 mL/hr over 60 Minutes Intravenous  Once 10/09/17 1418 10/09/17 1422       Patient was given sequential compression devices, early ambulation, and chemoprophylaxis to prevent DVT.  Patient benefited maximally from hospital stay and there were no complications.    Recent vital signs:  Patient Vitals for the past 24 hrs:  BP Temp Temp src Pulse Resp SpO2  10/12/17 0449 122/78 98.1 F (36.7 C) Oral 74 - 96 %  10/11/17 2011 128/77 98.2 F (36.8 C) Oral 85 - 98 %  10/11/17 1228 109/64 98.7 F (37.1 C) Oral 73 16 95 %     Recent laboratory studies:  Recent Labs    10/09/17 1157  WBC 5.6  HGB  12.9  HCT 39.3  PLT 333  NA 141  K 3.8  CL 106  CO2 26  BUN 12  CREATININE 0.76  GLUCOSE 105*  CALCIUM 9.2     Discharge Medications:   Allergies as of 10/12/2017      Reactions   Erythromycin Nausea And Vomiting, Other (See Comments)   GI UPSET      Medication List    STOP taking these medications   amoxicillin-clavulanate 875-125 MG tablet Commonly known as:  AUGMENTIN   doxycycline 100 MG tablet Commonly known as:  VIBRA-TABS     TAKE these medications   cefTRIAXone IVPB Commonly known as:  ROCEPHIN Inject 2 g into the vein daily. Indication:  pasteurella infection with tenosynovitis Last Day of Therapy:  10/22/17 Labs - Once weekly:  CBC/D and BMP, Labs - Every other week:  ESR and CRP   celecoxib 200 MG  capsule Commonly known as:  CELEBREX Take 1 capsule (200 mg total) by mouth daily.   escitalopram 20 MG tablet Commonly known as:  LEXAPRO Take 1 tablet (20 mg total) by mouth daily.   esomeprazole 20 MG capsule Commonly known as:  NEXIUM Take 20 mg by mouth daily at 12 noon.   fexofenadine 180 MG tablet Commonly known as:  ALLEGRA Take 180 mg by mouth daily.   multivitamin with minerals Tabs tablet Take 1 tablet by mouth daily.   ondansetron 4 MG tablet Commonly known as:  ZOFRAN Take 1 tablet (4 mg total) by mouth every 8 (eight) hours as needed for nausea or vomiting.   traMADol 50 MG tablet Commonly known as:  ULTRAM Take 1-2 tablets (50-100 mg total) by mouth 3 (three) times daily as needed.            Home Infusion Instuctions  (From admission, onward)        Start     Ordered   10/12/17 0000  Home infusion instructions [facility] May follow [place] Dosing Protocol; May administer Cathflo as needed to maintain patency of vascular access device.; Flushing of vascular access device: per [facility] Protocol: 0.9% NaCl pre/post medica...    Question Answer Comment  Instructions May follow [place] Dosing Protocol   Instructions May administer Cathflo as needed to maintain patency of vascular access device.   Instructions Flushing of vascular access device: per [facility] Protocol: 0.9% NaCl pre/post medication administration and prn patency; Heparin 100 u/ml, 4m for implanted ports and Heparin 10u/ml, 584mfor all other central venous catheters.   Instructions May follow [facility] Anaphylaxis Protocol for First Dose Administration in the home: 0.9% NaCl at 25-50 ml/hr to maintain IV access for protocol meds. Epinephrine 0.3 ml IV/IM PRN and Benadryl 25-50 IV/IM PRN s/s of anaphylaxis.   Instructions [facility] Infusion Coordinator (RN) to assist per patient IV care needs in the home PRN.      10/12/17 [place](From admission,  onward)        Start     Ordered   10/09/17 1537  DME 3 n 1  Once     10/09/17 1536      Diagnostic Studies: Dg Foot Complete Left  Result Date: 10/06/2017 CLINICAL DATA:  Cat bite left foot. EXAM: LEFT FOOT - COMPLETE 3+ VIEW COMPARISON:  03/22/2015. FINDINGS: Diffuse soft tissue swelling.No radiopaque foreign body. No acute bony or joint abnormality identified. No evidence of fracture or dislocation. Degenerative change first MTP  joint. Prior plate screw fixation distal fibula. Stable appearing calcaneal spurring again noted. IMPRESSION: 1. Diffuse soft tissue swelling, given the patient's history cellulitis could present this fashion. No radiopaque foreign body. 2. No acute bony abnormality identified. No evidence of fracture dislocation. Electronically Signed   By: Marcello Moores  Register   On: 10/06/2017 14:08   [place] Ekg Site Rite  Result Date: 10/11/2017 If Site Rite image not attached, placement could not be confirmed due to current cardiac rhythm.   Disposition: Discharge disposition: 01-Home or Self Care       Discharge Instructions    Home infusion instructions [place] May follow [place] Dosing Protocol; May administer Cathflo as needed to maintain patency of vascular access device.; Flushing of vascular access device: per [facility] Protocol: 0.9% NaCl pre/post medica...   Complete by:  As directed    Instructions:  May follow [place] Dosing Protocol   Instructions:  May administer Cathflo as needed to maintain patency of vascular access device.   Instructions:  Flushing of vascular access device: per [facility] Protocol: 0.9% NaCl pre/post medication administration and prn patency; Heparin 100 u/ml, 60m for implanted ports and Heparin 10u/ml, 560mfor all other central venous catheters.   Instructions:  May follow [facility] Anaphylaxis Protocol for First Dose Administration in the home: 0.9% NaCl at 25-50 ml/hr to maintain IV access for protocol meds. Epinephrine 0.3 ml IV/IM PRN  and Benadryl 25-50 IV/IM PRN s/s of anaphylaxis.   Instructions:  [facility] Coord[facility] (RN) to assist per patient IV care needs in the home PRN.      Follow-up Information    XuLeandrew KoyanagiMD In 1 week.   Specialty:  Orthopedic Surgery Why:  For wound re-check Contact information: [address] [phone]          Signed: MaAundra Dubin/07/2017, 10:30 AM

## 2017-10-12 NOTE — Progress Notes (Signed)
Subjective: 3 Days Post-Op Procedure(s) (LRB): IRRIGATION AND DEBRIDEMENT LEFT FOOT (Left) Patient reports pain as mild.  Doing well this am.  Objective: Vital signs in last 24 hours: Temp:  [98.1 F (36.7 C)-98.7 F (37.1 C)] 98.1 F (36.7 C) (06/03 0449) Pulse Rate:  [73-85] 74 (06/03 0449) Resp:  [16] 16 (06/02 1228) BP: (109-128)/(64-78) 122/78 (06/03 0449) SpO2:  [95 %-98 %] 96 % (06/03 0449)  Intake/Output from previous day: 06/02 0701 - 06/03 0700 In: 4205 [P.O.:480; I.V.:3625; IV Piggyback:100] Out: -  Intake/Output this shift: Total I/O In: 480 [P.O.:480] Out: -   Recent Labs    10/09/17 1157  HGB 12.9   Recent Labs    10/09/17 1157  WBC 5.6  RBC 4.45  HCT 39.3  PLT 333   Recent Labs    10/09/17 1157  NA 141  K 3.8  CL 106  CO2 26  BUN 12  CREATININE 0.76  GLUCOSE 105*  CALCIUM 9.2   No results for input(s): LABPT, INR in the last 72 hours.  Neurologically intact Neurovascular intact Sensation intact distally Intact pulses distally Dorsiflexion/Plantar flexion intact Incision: moderate drainage No cellulitis present Compartment soft    Assessment/Plan: 3 Days Post-Op Procedure(s) (LRB): IRRIGATION AND DEBRIDEMENT LEFT FOOT (Left) Advance diet Up with therapy D/C IV fluids Discharge home with home health  CX grew pasteurella.  Continue with IV ceftriaxone. WBAT RLE Elevate for pain and swelling    Aundra Dubin 10/12/2017, 10:25 AM

## 2017-10-12 NOTE — Anesthesia Postprocedure Evaluation (Signed)
Anesthesia Post Note  Patient: Madison Bryant  Procedure(s) Performed: IRRIGATION AND DEBRIDEMENT LEFT FOOT (Left Foot)     Patient location during evaluation: PACU Anesthesia Type: General Level of consciousness: awake and alert Pain management: pain level controlled Vital Signs Assessment: post-procedure vital signs reviewed and stable Respiratory status: spontaneous breathing, nonlabored ventilation, respiratory function stable and patient connected to nasal cannula oxygen Cardiovascular status: blood pressure returned to baseline and stable Postop Assessment: no apparent nausea or vomiting Anesthetic complications: no    Last Vitals:  Vitals:   10/11/17 2011 10/12/17 0449  BP: 128/77 122/78  Pulse: 85 74  Resp:    Temp: 36.8 C 36.7 C  SpO2: 98% 96%    Last Pain:  Vitals:   10/12/17 0449  TempSrc: Oral  PainSc:                  Montez Hageman

## 2017-10-12 NOTE — Progress Notes (Signed)
[facility] will provide [facility] services at DC today. [facility] will see pt around 4-5 PM today for first home dose of IV ABX.  I spoke at length with the patient's husband who will be the primary  CG and will administer the IVA BX at home.  He is capable and  Verbalized eagerness/willingness to help.  If patient discharges after hours, please call [phone].   Larry Sierras 10/12/2017, 11:22 AM

## 2017-10-14 LAB — AEROBIC/ANAEROBIC CULTURE (SURGICAL/DEEP WOUND)

## 2017-10-14 LAB — AEROBIC/ANAEROBIC CULTURE W GRAM STAIN (SURGICAL/DEEP WOUND)

## 2017-10-16 ENCOUNTER — Ambulatory Visit (INDEPENDENT_AMBULATORY_CARE_PROVIDER_SITE_OTHER): Payer: BC Managed Care – PPO | Admitting: Orthopaedic Surgery

## 2017-10-16 ENCOUNTER — Encounter (INDEPENDENT_AMBULATORY_CARE_PROVIDER_SITE_OTHER): Payer: Self-pay | Admitting: Orthopaedic Surgery

## 2017-10-16 DIAGNOSIS — L02612 Cutaneous abscess of left foot: Secondary | ICD-10-CM

## 2017-10-16 NOTE — Progress Notes (Signed)
   Post-Op Visit Note   Patient: Madison Bryant           Date of Birth: 15-Aug-1955           MRN: [id number] Visit Date: 10/16/2017 PCP: Susy Frizzle, MD   Assessment & Plan:  Chief Complaint:  Chief Complaint  Patient presents with  . Left Foot - Pain   Visit Diagnoses:  1. Foot abscess, left     Plan: Patient is a pleasant 62 year old female who presents our clinic today 7 days status post I&D left foot from a cat bite.  Cultures grew Pasteurella.  She has a PICC line and has been on IV Rocephin since discharge 2 days postop.  She has been doing well.  No fevers, chills or any other systemic symptoms.  Minimal pain.  She has been weightbearing as tolerated in a postop shoe but is keeping this to a minimum as much as possible.  Examination of the left foot reveals some sloughing of the skin to the middle incision.  She has some ecchymosis and swelling but this is not dramatic.  No signs of cellulitis or infection.  Nylon sutures intact.  Today, we will remove the 2 metal nylon sutures.  She will continue with wet-to-dry dressing changes twice daily.  Follow-up with [place] in 1 week for removal of the remaining sutures.    Follow-Up Instructions: Return in about 1 week (around 10/23/2017).   Orders:  No orders of the defined types were placed in this encounter.  No orders of the defined types were placed in this encounter.   Imaging: No results found.  PMFS History: Patient Active Problem List   Diagnosis Date Noted  . Cat bite   . 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 Tenosynovitis of left foot   . Foot abscess, left 10/09/2017   Past Medical History:  Diagnosis Date  . Allergy   . Ankle fracture    left  . Arthritis    fingers, knees and hips  . Asthma   . Cellulitis    left foot  . GERD (gastroesophageal reflux disease)    OTC med prn  . Osteoporosis   . PONV (postoperative nausea and vomiting)   . Wears glasses     Family History  Problem Relation  Age of Onset  . Miscarriages / [place] Mother     Past Surgical History:  Procedure Laterality Date  . ABDOMINAL HYSTERECTOMY    . I&D EXTREMITY Left 10/09/2017   Procedure: IRRIGATION AND DEBRIDEMENT LEFT FOOT;  Surgeon: Leandrew Koyanagi, MD;  Location: [place];  Service: Orthopedics;  Laterality: Left;  . ORIF FIBULA FRACTURE Left 08/21/2016   Procedure: OPEN REDUCTION INTERNAL FIXATION (ORIF) DISTAL FIBULA FRACTURE;  Surgeon: Ninetta Lights, MD;  Location: [place];  Service: Orthopedics;  Laterality: Left;  . ROTATOR CUFF REPAIR Right   . TONSILLECTOMY    . TUBAL LIGATION     Social History   Occupational History  . Not on file  Tobacco Use  . Smoking status: Never Smoker  . Smokeless tobacco: Never Used  Substance and Sexual Activity  . Alcohol use: No  . Drug use: No  . Sexual activity: Yes

## 2017-10-20 ENCOUNTER — Encounter (INDEPENDENT_AMBULATORY_CARE_PROVIDER_SITE_OTHER): Payer: Self-pay | Admitting: Radiology

## 2017-10-20 ENCOUNTER — Ambulatory Visit (INDEPENDENT_AMBULATORY_CARE_PROVIDER_SITE_OTHER): Payer: BC Managed Care – PPO | Admitting: Orthopaedic Surgery

## 2017-10-22 ENCOUNTER — Ambulatory Visit (INDEPENDENT_AMBULATORY_CARE_PROVIDER_SITE_OTHER): Payer: BC Managed Care – PPO | Admitting: Internal Medicine

## 2017-10-22 ENCOUNTER — Encounter: Payer: Self-pay | Admitting: Internal Medicine

## 2017-10-22 ENCOUNTER — Telehealth: Payer: Self-pay | Admitting: *Deleted

## 2017-10-22 VITALS — BP 120/80 | HR 82 | Temp 98.6°F | Wt 178.0 lb

## 2017-10-22 DIAGNOSIS — W5501XA Bitten by cat, initial encounter: Secondary | ICD-10-CM | POA: Diagnosis not present

## 2017-10-22 DIAGNOSIS — W5501XS Bitten by cat, sequela: Secondary | ICD-10-CM

## 2017-10-22 DIAGNOSIS — A280 Pasteurellosis: Secondary | ICD-10-CM

## 2017-10-22 MED ORDER — AMOXICILLIN-POT CLAVULANATE 875-125 MG PO TABS: 1.0000 | ORAL_TABLET | Freq: Two times a day (BID) | ORAL | 0 refills | Status: DC

## 2017-10-22 NOTE — Progress Notes (Signed)
RFV: hospital follow up for left foot cat bite infection with pasteurella  Patient ID: [name], female   DOB: [birth year], 62 y.o.   MRN: [id number]  HPI Madison Bryant is a62yo F who sustained a cat bite to her left foot that failed oral abtx required debridement and IV abtx, now on ceftriaxone. Last day of iv abtx. cx grew pasteurella. Her foot is healing though had a few sutures midline of incision removed so that drainage is still occurring from that site. No fever, chills, nightsweats.  Outpatient Encounter Medications as of 10/22/2017  Medication Sig  . cefTRIAXone (ROCEPHIN) IVPB Inject 2 g into the vein daily. Indication:  pasteurella infection with tenosynovitis Last Day of Therapy:  10/22/17 Labs - Once weekly:  CBC/D and BMP, Labs - Every other week:  ESR and CRP  . celecoxib (CELEBREX) 200 MG capsule Take 1 capsule (200 mg total) by mouth daily.  Marland Kitchen escitalopram (LEXAPRO) 20 MG tablet Take 1 tablet (20 mg total) by mouth daily.  Marland Kitchen esomeprazole (NEXIUM) 20 MG capsule Take 20 mg by mouth daily at 12 noon.  . fexofenadine (ALLEGRA) 180 MG tablet Take 180 mg by mouth daily.  . Multiple Vitamin (MULTIVITAMIN WITH MINERALS) TABS tablet Take 1 tablet by mouth daily.  . ondansetron (ZOFRAN) 4 MG tablet Take 1 tablet (4 mg total) by mouth every 8 (eight) hours as needed for nausea or vomiting.  . traMADol (ULTRAM) 50 MG tablet Take 1-2 tablets (50-100 mg total) by mouth 3 (three) times daily as needed.   No facility-administered encounter medications on file as of 10/22/2017.      Patient Active Problem List   Diagnosis Date Noted  . Cat bite   . 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 Tenosynovitis of left foot   . Foot abscess, left 10/09/2017     Health Maintenance Due  Topic Date Due  . COLONOSCOPY  05/12/2017     Review of Systems Left foot wound, otherwise 12 point ros is negative Physical Exam   BP 120/80   Pulse 82   Temp 98.6 F (37 C) (Oral)   Wt 178 lb  (80.7 kg)   BMI 27.88 kg/m   Physical Exam  Constitutional:  oriented to person, place, and time. appears well-developed and well-nourished. No distress.  HENT: [place]/AT, PERRLA, no scleral icterus Mouth/Throat: Oropharynx is clear and moist. No oropharyngeal exudate.  Ext: picc line is c/d/i. Left foot swelling is improved. Exudate in midline of incision of left foot Skin: Skin is warm and dry. No rash noted. No erythema.  Psychiatric: a normal mood and affect.  behavior is normal.   CBC Lab Results  Component Value Date   WBC 5.6 10/09/2017   RBC 4.45 10/09/2017   HGB 12.9 10/09/2017   HCT 39.3 10/09/2017   PLT 333 10/09/2017   MCV 88.3 10/09/2017   MCH 29.0 10/09/2017   MCHC 32.8 10/09/2017   RDW 12.0 10/09/2017   LYMPHSABS 884 12/22/2016   MONOABS 340 12/22/2016   EOSABS 136 12/22/2016    BMET Lab Results  Component Value Date   NA 141 10/09/2017   K 3.8 10/09/2017   CL 106 10/09/2017   CO2 26 10/09/2017   GLUCOSE 105 (H) 10/09/2017   BUN 12 10/09/2017   CREATININE 0.76 10/09/2017   CALCIUM 9.2 10/09/2017   GFRNONAA >60 10/09/2017   GFRAA >60 10/09/2017   Lab Results  Component Value Date   ESRSEDRATE 15 (H) 10/09/2017  Lab Results  Component Value Date   CRP 5.6 (H) 10/09/2017      Assessment and Plan  Pasteurella deep tissue infection from cat bite =Will finish out Iv abtx then complete the remaining course with 10 d of amox/clav

## 2017-10-22 NOTE — Telephone Encounter (Signed)
Per verbal from Dr Baxter Flattery called [facility] and advised to DC PICC today, 10/22/17, after her last dose.

## 2017-10-23 ENCOUNTER — Encounter (INDEPENDENT_AMBULATORY_CARE_PROVIDER_SITE_OTHER): Payer: Self-pay | Admitting: Orthopaedic Surgery

## 2017-10-23 ENCOUNTER — Ambulatory Visit (INDEPENDENT_AMBULATORY_CARE_PROVIDER_SITE_OTHER): Payer: BC Managed Care – PPO | Admitting: Orthopaedic Surgery

## 2017-10-23 DIAGNOSIS — L02612 Cutaneous abscess of left foot: Secondary | ICD-10-CM

## 2017-10-23 NOTE — Progress Notes (Signed)
Madison Bryant comes in today for a wound check.  She is feeling much better.  She is starting oral antibiotics today.  She has finished her IV antibiotics.  The wound is significantly improved.  We remove the remaining sutures today.  We will continue with wet-to-dry dressings.  I anticipate that this will be healed up over the next couple weeks.  I will recheck her in 2 weeks.

## 2017-10-29 ENCOUNTER — Other Ambulatory Visit (INDEPENDENT_AMBULATORY_CARE_PROVIDER_SITE_OTHER): Payer: Self-pay | Admitting: Radiology

## 2017-10-29 MED ORDER — MUPIROCIN 2 % EX OINT: 1.0000 "application " | TOPICAL_OINTMENT | Freq: Two times a day (BID) | CUTANEOUS | 1 refills | Status: DC

## 2017-10-29 NOTE — Telephone Encounter (Signed)
Patient now advised per Dr Erlinda Hong that she can use mupirocin on the wound.  Rx sent to pharm for her.

## 2017-11-02 NOTE — Telephone Encounter (Signed)
Telephone encounter to be closed out. Please see patient's chart where she was followed by Orthopedics as well as infectious disease for this issue.

## 2017-11-09 ENCOUNTER — Ambulatory Visit: Payer: BC Managed Care – PPO | Admitting: Internal Medicine

## 2017-11-09 VITALS — BP 104/69 | HR 77 | Temp 98.3°F | Wt 177.0 lb

## 2017-11-09 DIAGNOSIS — W5501XS Bitten by cat, sequela: Secondary | ICD-10-CM | POA: Diagnosis not present

## 2017-11-09 DIAGNOSIS — W5501XA Bitten by cat, initial encounter: Secondary | ICD-10-CM

## 2017-11-09 DIAGNOSIS — A280 Pasteurellosis: Secondary | ICD-10-CM | POA: Diagnosis not present

## 2017-11-09 MED ORDER — AMOXICILLIN-POT CLAVULANATE 875-125 MG PO TABS: 1.0000 | ORAL_TABLET | Freq: Two times a day (BID) | ORAL | 0 refills | Status: DC

## 2017-11-09 NOTE — Progress Notes (Signed)
RFV: follow up on pasteurella soft tissue infeciton  Patient ID: Madison Bryant, female   DOB: December 09, 1955, 62 y.o.   MRN: [id number]  HPI Madison Bryant is a 62yo F with history of cat bite to her foot that subsequently was infected with pasteurella requiring I x D. She was on iv abtx then transitioned to augmentin however she still has some slight redness nad exudate to the wound bed. No fevers or chills  Outpatient Encounter Medications as of 11/09/2017  Medication Sig  . celecoxib (CELEBREX) 200 MG capsule Take 1 capsule (200 mg total) by mouth daily.  Marland Kitchen escitalopram (LEXAPRO) 20 MG tablet Take 1 tablet (20 mg total) by mouth daily.  Marland Kitchen esomeprazole (NEXIUM) 20 MG capsule Take 20 mg by mouth daily at 12 noon.  . Multiple Vitamin (MULTIVITAMIN WITH MINERALS) TABS tablet Take 1 tablet by mouth daily.  . mupirocin ointment (BACTROBAN) 2 % Apply 1 application topically 2 (two) times daily.  Marland Kitchen amoxicillin-clavulanate (AUGMENTIN) 875-125 MG tablet Take 1 tablet by mouth 2 (two) times daily. (Patient not taking: Reported on 11/09/2017)  . cefTRIAXone (ROCEPHIN) IVPB Inject 2 g into the vein daily. Indication:  pasteurella infection with tenosynovitis Last Day of Therapy:  10/22/17 Labs - Once weekly:  CBC/D and BMP, Labs - Every other week:  ESR and CRP (Patient not taking: Reported on 10/23/2017)  . fexofenadine (ALLEGRA) 180 MG tablet Take 180 mg by mouth daily.  . ondansetron (ZOFRAN) 4 MG tablet Take 1 tablet (4 mg total) by mouth every 8 (eight) hours as needed for nausea or vomiting. (Patient not taking: Reported on 10/23/2017)  . traMADol (ULTRAM) 50 MG tablet Take 1-2 tablets (50-100 mg total) by mouth 3 (three) times daily as needed. (Patient not taking: Reported on 10/23/2017)   No facility-administered encounter medications on file as of 11/09/2017.      Patient Active Problem List   Diagnosis Date Noted  . Cat bite   . 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 Tenosynovitis of left foot    . Foot abscess, left 10/09/2017     Health Maintenance Due  Topic Date Due  . COLONOSCOPY  05/12/2017     Review of Systems Ongoing drainage from foot. Otherwise 12 point ros is negative Physical Exam   BP 104/69   Pulse 77   Temp 98.3 F (36.8 C) (Oral)   Wt 177 lb (80.3 kg)   BMI 27.72 kg/m    gen = a x o by4 in nad Skin = wound to dorsum of foot still has some exudate in wound bed and surrounding erythema CBC Lab Results  Component Value Date   WBC 5.6 10/09/2017   RBC 4.45 10/09/2017   HGB 12.9 10/09/2017   HCT 39.3 10/09/2017   PLT 333 10/09/2017   MCV 88.3 10/09/2017   MCH 29.0 10/09/2017   MCHC 32.8 10/09/2017   RDW 12.0 10/09/2017   LYMPHSABS 884 12/22/2016   MONOABS 340 12/22/2016   EOSABS 136 12/22/2016    BMET Lab Results  Component Value Date   NA 141 10/09/2017   K 3.8 10/09/2017   CL 106 10/09/2017   CO2 26 10/09/2017   GLUCOSE 105 (H) 10/09/2017   BUN 12 10/09/2017   CREATININE 0.76 10/09/2017   CALCIUM 9.2 10/09/2017   GFRNONAA >60 10/09/2017   GFRAA >60 10/09/2017      Assessment and Plan  Pasteurella foot wound = will extend out her abtx for addn 7 days to  finish out treatment.

## 2017-11-10 ENCOUNTER — Ambulatory Visit (INDEPENDENT_AMBULATORY_CARE_PROVIDER_SITE_OTHER): Payer: BC Managed Care – PPO | Admitting: Orthopaedic Surgery

## 2017-11-10 ENCOUNTER — Encounter (INDEPENDENT_AMBULATORY_CARE_PROVIDER_SITE_OTHER): Payer: Self-pay | Admitting: Orthopaedic Surgery

## 2017-11-10 ENCOUNTER — Other Ambulatory Visit (INDEPENDENT_AMBULATORY_CARE_PROVIDER_SITE_OTHER): Payer: Self-pay

## 2017-11-10 DIAGNOSIS — L02612 Cutaneous abscess of left foot: Secondary | ICD-10-CM

## 2017-11-10 NOTE — Progress Notes (Signed)
Vannah comes in today for a wound check.  Overall he is doing well.  She has any significant pain.  She mainly has some discomfort with prolonged standing weightbearing due to swelling.  Physical exam shows a dried scab over the foot.  There is some postsurgical erythema without any evidence of cellulitis or fluctuance or continued infection.  From my standpoint everything looks good.  I am going to release her back to work.  She will finish out her amoxicillin as prescribed by Dr. Graylon Good.  Follow-up as needed.

## 2017-12-02 ENCOUNTER — Encounter: Payer: Self-pay | Admitting: Family Medicine

## 2017-12-02 ENCOUNTER — Ambulatory Visit: Payer: BC Managed Care – PPO | Admitting: Family Medicine

## 2017-12-02 VITALS — BP 110/74 | HR 87 | Temp 98.3°F | Wt 169.0 lb

## 2017-12-02 DIAGNOSIS — H6981 Other specified disorders of Eustachian tube, right ear: Secondary | ICD-10-CM

## 2017-12-02 DIAGNOSIS — B351 Tinea unguium: Secondary | ICD-10-CM | POA: Diagnosis not present

## 2017-12-02 MED ORDER — MONTELUKAST SODIUM 10 MG PO TABS
10.0000 mg | ORAL_TABLET | Freq: Every day | ORAL | 3 refills | Status: DC
Start: 2017-12-02 — End: 2020-01-17

## 2017-12-02 NOTE — Progress Notes (Signed)
Patient ID: Madison Bryant, female    DOB: 10/09/1955, 62 y.o.   MRN: [id number]  PCP: Susy Frizzle, MD  Chief Complaint  Patient presents with  . Nail Problem    Patient has concerns of possible toe fungus. Also has concerns of her right ear feeling like it has water in it and popping.    Subjective:   Madison Bryant is a 62 y.o. female, presents to clinic with CC of toe nail abnormality x 1 months, gradually worsening.  She is concerned for fungal nail infection.  She had one nail like this before but last month several nails develop the same problem.  She has not gone to get a pedicure in the last several months.  She noted that it began worsening after having a infection and OR I&D to left dorsal foot after a cat bite roughly two months ago.  She was taken to the OR and just last week finished taking her long term antibiotics.  She is afraid to trim her nails.  She has been applying over the counter nail antifungal for 1-2 weeks but notes no improvement.  She is interested in oral medication to treat.   She is hesitant to have any procedures done on nails, in the past had left 2nd toe nail removed and it never grew back.  Also has complaint of right ear popping, feels like "there is water in there" and it  "feels weird" for a couple months.  No decreased hearing, no ringing, swelling, redness, drainage, pain.  No URI sx.  Has hx of deviated septum and nasal deformity, hr right nare is often blocked but left is not.  Is currently using OTC antihistamine intermitently, but no steroid nasal spray.  Does have some seasonal allergies. No fever, sore throat, cough, HA, dizziness.    Patient Active Problem List   Diagnosis Date Noted  . Cat bite   . 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 Tenosynovitis of left foot   . Foot abscess, left 10/09/2017     Prior to Admission medications   Medication Sig Start Date End Date Taking? Authorizing Provider  celecoxib (CELEBREX)  200 MG capsule Take 1 capsule (200 mg total) by mouth daily. 12/26/16  Yes Susy Frizzle, MD  escitalopram (LEXAPRO) 20 MG tablet Take 1 tablet (20 mg total) by mouth daily. 07/23/17  Yes Susy Frizzle, MD  esomeprazole (NEXIUM) 20 MG capsule Take 20 mg by mouth daily at 12 noon.   Yes [provider]  fexofenadine (ALLEGRA) 180 MG tablet Take 180 mg by mouth daily.   Yes [provider]  Multiple Vitamin (MULTIVITAMIN WITH MINERALS) TABS tablet Take 1 tablet by mouth daily.   Yes [provider]     Allergies  Allergen Reactions  . Erythromycin Nausea And Vomiting and Other (See Comments)    GI UPSET     Family History  Problem Relation Age of Onset  . Miscarriages / [place] Mother      Social History   Socioeconomic History  . Marital status: Married    Spouse name: Not on file  . Number of children: Not on file  . Years of education: Not on file  . Highest education level: Not on file  Occupational History  . Not on file  Social Needs  . Financial resource strain: Not on file  . Food insecurity:    Worry: Not on file    Inability: Not  on file  . Transportation needs:    Medical: Not on file    Non-medical: Not on file  Tobacco Use  . Smoking status: Never Smoker  . Smokeless tobacco: Never Used  Substance and Sexual Activity  . Alcohol use: No  . Drug use: No  . Sexual activity: Yes  Lifestyle  . Physical activity:    Days per week: Not on file    Minutes per session: Not on file  . Stress: Not on file  Relationships  . Social connections:    Talks on phone: Not on file    Gets together: Not on file    Attends religious service: Not on file    Active member of club or organization: Not on file    Attends meetings of clubs or organizations: Not on file    Relationship status: Not on file  . Intimate partner violence:    Fear of current or ex partner: Not on file    Emotionally abused: Not on file    Physically  abused: Not on file    Forced sexual activity: Not on file  Other Topics Concern  . Not on file  Social History Narrative  . Not on file     Review of Systems  Constitutional: Negative.   HENT: Negative.   Eyes: Negative.   Respiratory: Negative.   Cardiovascular: Negative.   Gastrointestinal: Negative.   Endocrine: Negative.   Genitourinary: Negative.   Musculoskeletal: Negative.   Skin: Negative.   Allergic/Immunologic: Negative.   Neurological: Negative.   Hematological: Negative.   Psychiatric/Behavioral: Negative.   All other systems reviewed and are negative.      Objective:    Vitals:   12/02/17 1520  BP: 110/74  Pulse: 87  Temp: 98.3 F (36.8 C)  TempSrc: Oral  SpO2: 96%  Weight: 169 lb (76.7 kg)      Physical Exam  Constitutional: She appears well-developed and well-nourished. No distress.  HENT:  Head: Normocephalic and atraumatic.  Right Ear: Tympanic membrane, external ear and ear canal normal.  Left Ear: Tympanic membrane, external ear and ear canal normal.  Nose: Mucosal edema, nasal deformity and septal deviation present. No rhinorrhea. Right sinus exhibits no maxillary sinus tenderness and no frontal sinus tenderness. Left sinus exhibits no maxillary sinus tenderness and no frontal sinus tenderness.  Mouth/Throat: Uvula is midline, oropharynx is clear and moist and mucous membranes are normal.  Eyes: Conjunctivae are normal. Right eye exhibits no discharge. Left eye exhibits no discharge.  Neck: No tracheal deviation present.  Cardiovascular: Normal rate and regular rhythm.  Pulmonary/Chest: Effort normal. No stridor. No respiratory distress.  Musculoskeletal: Normal range of motion.  Neurological: She is alert. She exhibits normal muscle tone. Coordination normal.  Skin: Skin is warm and dry. No rash noted. She is not diaphoretic. No cyanosis. Nails show no clubbing.  Thickened raised and yellowed toenails to right 1st, 2nd and 4th toes and  left 1st, 3rd and 4th  Psychiatric: She has a normal mood and affect. Her behavior is normal.  Nursing note and vitals reviewed.           Assessment & Plan:      ICD-10-CM   1. Onychomycosis B35.1 CBC with Differential    COMPLETE METABOLIC PANEL WITH GFR    terbinafine (LAMISIL) 250 MG tablet    CBC with Differential/Platelet    COMPLETE METABOLIC PANEL WITH GFR  2. Eustachian tube dysfunction, right H69.81      Will obtain  basic lab work for baseline for possible tx with terbinafine for fungal nail infection.  Consulting with pt's PCP.   Delsa Grana, PA-C 12/02/17 4:02 PM   12/03/17 5:01 PM Discussed case with PCP, Dr. Dennard Schaumann who agrees to tx with normal labs and no statin use.  Pt to get 30 day supply and 2 refills, when she gets refill she is to return for repeated labs - meds sent to pharmacy, labs entered.  Pt to be contacted with lab results and plan.  She was given drug information re: terbinafine and SE, adverse effects, duration of tx and monitoring discussed.

## 2017-12-02 NOTE — Patient Instructions (Signed)
For your ear it may be sinus/allergy related - keep taking your antihistamine daily and add a steroid nasal spray and montelukast.  I will consult with Dr. Dennard Schaumann tomorrow morning and touch base with you about the medicine to treat the nail infection.  You would come back in 6 weeks to recheck your blood work.     Eustachian Tube Dysfunction The eustachian tube connects the middle ear to the back of the nose. It regulates air pressure in the middle ear by allowing air to move between the ear and nose. It also helps to drain fluid from the middle ear space. When the eustachian tube does not function properly, air pressure, fluid, or both can build up in the middle ear. Eustachian tube dysfunction can affect one or both ears. What are the causes? This condition happens when the eustachian tube becomes blocked or cannot open normally. This may result from:  Ear infections.  Colds and other upper respiratory infections.  Allergies.  Irritation, such as from cigarette smoke or acid from the stomach coming up into the esophagus (gastroesophageal reflux).  Sudden changes in air pressure, such as from descending in an airplane.  Abnormal growths in the nose or throat, such as nasal polyps, tumors, or enlarged tissue at the back of the throat (adenoids).  What increases the risk? This condition may be more likely to develop in people who smoke and people who are overweight. Eustachian tube dysfunction may also be more likely to develop in children, especially children who have:  Certain birth defects of the mouth, such as cleft palate.  Large tonsils and adenoids.  What are the signs or symptoms? Symptoms of this condition may include:  A feeling of fullness in the ear.  Ear pain.  Clicking or popping noises in the ear.  Ringing in the ear.  Hearing loss.  Loss of balance.  Symptoms may get worse when the air pressure around you changes, such as when you travel to an area of  high elevation or fly on an airplane. How is this diagnosed? This condition may be diagnosed based on:  Your symptoms.  A physical exam of your ear, nose, and throat.  Tests, such as those that measure: ? The movement of your eardrum (tympanogram). ? Your hearing (audiometry).  How is this treated? Treatment depends on the cause and severity of your condition. If your symptoms are mild, you may be able to relieve your symptoms by moving air into ("popping") your ears. If you have symptoms of fluid in your ears, treatment may include:  Decongestants.  Antihistamines.  Nasal sprays or ear drops that contain medicines that reduce swelling (steroids).  In some cases, you may need to have a procedure to drain the fluid in your eardrum (myringotomy). In this procedure, a small tube is placed in the eardrum to:  Drain the fluid.  Restore the air in the middle ear space.  Follow these instructions at home:  Take over-the-counter and prescription medicines only as told by your health care provider.  Use techniques to help pop your ears as recommended by your health care provider. These may include: ? Chewing gum. ? Yawning. ? Frequent, forceful swallowing. ? Closing your mouth, holding your nose closed, and gently blowing as if you are trying to blow air out of your nose.  Do not do any of the following until your health care provider approves: ? Travel to high altitudes. ? Fly in airplanes. ? Work in a Pension scheme manager or  room. ? Scuba dive.  Keep your ears dry. Dry your ears completely after showering or bathing.  Do not smoke.  Keep all follow-up visits as told by your health care provider. This is important. Contact a health care provider if:  Your symptoms do not go away after treatment.  Your symptoms come back after treatment.  You are unable to pop your ears.  You have: ? A fever. ? Pain in your ear. ? Pain in your head or neck. ? Fluid draining from your  ear.  Your hearing suddenly changes.  You become very dizzy.  You lose your balance. This information is not intended to replace advice given to you by your health care provider. Make sure you discuss any questions you have with your health care provider. Document Released: 05/25/2015 Document Revised: 10/04/2015 Document Reviewed: 05/17/2014 Elsevier Interactive Patient Education  2018 [employer].   Fungal Nail Infection Fungal nail infection is a common fungal infection of the toenails or fingernails. This condition affects toenails more often than fingernails. More than one nail may be infected. The condition can be passed from person to person (is contagious). What are the causes? This condition is caused by a fungus. Several types of funguses can cause the infection. These funguses are common in moist and warm areas. If your hands or feet come into contact with the fungus, it may get into a crack in your fingernail or toenail and cause the infection. What increases the risk? The following factors may make you more likely to develop this condition:  Being female.  Having diabetes.  Being of older age.  Living with someone who has the fungus.  Walking barefoot in areas where the fungus thrives, such as showers or locker rooms.  Having poor circulation.  Wearing shoes and socks that cause your feet to sweat.  Having athlete's foot.  Having a nail injury or history of a recent nail surgery.  Having psoriasis.  Having a weak body defense system (immune system).  What are the signs or symptoms? Symptoms of this condition include:  A pale spot on the nail.  Thickening of the nail.  A nail that becomes yellow or brown.  A brittle or ragged nail edge.  A crumbling nail.  A nail that has lifted away from the nail bed.  How is this diagnosed? This condition is diagnosed with a physical exam. Your health care provider may take a scraping or clipping from your nail  to test for the fungus. How is this treated? Mild infections do not need treatment. If you have significant nail changes, treatment may include:  Oral antifungal medicines. You may need to take the medicine for several weeks or several months, and you may not see the results for a long time. These medicines can cause side effects. Ask your health care provider what problems to watch for.  Antifungal nail polish and nail cream. These may be used along with oral antifungal medicines.  Laser treatment of the nail.  Surgery to remove the nail. This may be needed for the most severe infections.  Treatment takes a long time, and the infection may come back. Follow these instructions at home: Medicines  Take or apply over-the-counter and prescription medicines only as told by your health care provider.  Ask your health care provider about using over-the-counter mentholated ointment on your nails. Lifestyle   Do not share personal items, such as towels or nail clippers.  Trim your nails often.  Wash and dry  your hands and feet every day.  Wear absorbent socks, and change your socks frequently.  Wear shoes that allow air to circulate, such as sandals or canvas tennis shoes. Throw out old shoes.  Wear rubber gloves if you are working with your hands in wet areas.  Do not walk barefoot in shower rooms or locker rooms.  Do not use a nail salon that does not use clean instruments.  Do not use artificial nails. General instructions  Keep all follow-up visits as told by your health care provider. This is important.  Use antifungal foot powder on your feet and in your shoes. Contact a health care provider if: Your infection is not getting better or it is getting worse after several months. This information is not intended to replace advice given to you by your health care provider. Make sure you discuss any questions you have with your health care provider. Document Released:  04/25/2000 Document Revised: 10/04/2015 Document Reviewed: 10/30/2014 Elsevier Interactive Patient Education  2018 [employer].

## 2017-12-03 LAB — CBC WITH DIFFERENTIAL/PLATELET
BASOS PCT: 0.6 %
Basophils Absolute: 31 cells/uL (ref 0–200)
EOS PCT: 1.8 %
Eosinophils Absolute: 92 cells/uL (ref 15–500)
HCT: 34 % — ABNORMAL LOW (ref 35.0–45.0)
Hemoglobin: 11.8 g/dL (ref 11.7–15.5)
Lymphs Abs: 1173 cells/uL (ref 850–3900)
MCH: 29.9 pg (ref 27.0–33.0)
MCHC: 34.7 g/dL (ref 32.0–36.0)
MCV: 86.1 fL (ref 80.0–100.0)
MPV: 9.2 fL (ref 7.5–12.5)
Monocytes Relative: 10.8 %
NEUTROS PCT: 63.8 %
Neutro Abs: 3254 cells/uL (ref 1500–7800)
Platelets: 270 10*3/uL (ref 140–400)
RBC: 3.95 10*6/uL (ref 3.80–5.10)
RDW: 12.1 % (ref 11.0–15.0)
TOTAL LYMPHOCYTE: 23 %
WBC mixed population: 551 cells/uL (ref 200–950)
WBC: 5.1 10*3/uL (ref 3.8–10.8)

## 2017-12-03 LAB — COMPLETE METABOLIC PANEL WITH GFR
AG Ratio: 1.7 (calc) (ref 1.0–2.5)
ALBUMIN MSPROF: 4.3 g/dL (ref 3.6–5.1)
ALT: 20 U/L (ref 6–29)
AST: 19 U/L (ref 10–35)
Alkaline phosphatase (APISO): 84 U/L (ref 33–130)
BILIRUBIN TOTAL: 0.6 mg/dL (ref 0.2–1.2)
BUN: 13 mg/dL (ref 7–25)
CO2: 29 mmol/L (ref 20–32)
Calcium: 9.5 mg/dL (ref 8.6–10.4)
Chloride: 105 mmol/L (ref 98–110)
Creat: 0.78 mg/dL (ref 0.50–0.99)
GFR, EST AFRICAN AMERICAN: 96 mL/min/{1.73_m2} (ref >=60)
GFR, EST NON AFRICAN AMERICAN: 83 mL/min/{1.73_m2} (ref >=60)
GLOBULIN: 2.5 g/dL (ref 1.9–3.7)
GLUCOSE: 94 mg/dL (ref 65–99)
POTASSIUM: 3.8 mmol/L (ref 3.5–5.3)
Sodium: 143 mmol/L (ref 135–146)
Total Protein: 6.8 g/dL (ref 6.1–8.1)

## 2017-12-03 MED ORDER — TERBINAFINE HCL 250 MG PO TABS: 250.0000 mg | ORAL_TABLET | Freq: Every day | ORAL | 2 refills | Status: DC

## 2017-12-03 NOTE — Progress Notes (Signed)
Please notify pt of labs and tx for her nails.  Lab work looks excellent.  Normal liver function.  I did speak with Dr. Dennard Schaumann regarding the oral medication for the fungal nail infections.  As long as you have had a chance to review the medication information and understand the possible risks and side effects, we can prescribe and start the terbinafine medicine.  You take one pill daily for up to 12 weeks.  We will do a 30 day supply with 2 refills and when you go to get your refill you will need to come to recheck your labs in clinic.  You can continue to apply the topical medicine to the nails as well.  And I will go ahead and refer you to podiatry to help you care for your feet with these new nail problems and recent infection.  I think you will get the best care by consulting with them.

## 2018-01-06 ENCOUNTER — Encounter: Payer: Self-pay | Admitting: Family Medicine

## 2018-01-13 ENCOUNTER — Ambulatory Visit: Payer: BC Managed Care – PPO | Admitting: Family Medicine

## 2018-01-14 ENCOUNTER — Ambulatory Visit: Payer: BC Managed Care – PPO | Admitting: Family Medicine

## 2018-01-19 ENCOUNTER — Encounter: Payer: Self-pay | Admitting: Family Medicine

## 2018-01-25 ENCOUNTER — Other Ambulatory Visit: Payer: Self-pay | Admitting: Family Medicine

## 2018-01-25 DIAGNOSIS — B351 Tinea unguium: Secondary | ICD-10-CM

## 2018-01-25 NOTE — Progress Notes (Signed)
Referral placed from result note on 12/03/17

## 2018-02-02 ENCOUNTER — Ambulatory Visit: Payer: BC Managed Care – PPO | Admitting: Podiatry

## 2018-02-04 ENCOUNTER — Other Ambulatory Visit: Payer: Self-pay | Admitting: Family Medicine

## 2018-02-08 ENCOUNTER — Encounter: Payer: Self-pay | Admitting: Podiatry

## 2018-02-08 ENCOUNTER — Ambulatory Visit: Payer: BC Managed Care – PPO | Admitting: Podiatry

## 2018-02-08 ENCOUNTER — Ambulatory Visit (INDEPENDENT_AMBULATORY_CARE_PROVIDER_SITE_OTHER): Payer: BC Managed Care – PPO

## 2018-02-08 DIAGNOSIS — B351 Tinea unguium: Secondary | ICD-10-CM | POA: Diagnosis not present

## 2018-02-08 DIAGNOSIS — T7840XA Allergy, unspecified, initial encounter: Secondary | ICD-10-CM | POA: Insufficient documentation

## 2018-02-08 DIAGNOSIS — R760 Raised antibody titer: Secondary | ICD-10-CM | POA: Insufficient documentation

## 2018-02-08 DIAGNOSIS — M2012 Hallux valgus (acquired), left foot: Secondary | ICD-10-CM

## 2018-02-08 DIAGNOSIS — M1990 Unspecified osteoarthritis, unspecified site: Secondary | ICD-10-CM | POA: Insufficient documentation

## 2018-02-08 NOTE — Patient Instructions (Signed)

## 2018-02-08 NOTE — Progress Notes (Signed)
Subjective:    Patient ID: Madison Bryant, female    DOB: 08-May-1956, 62 y.o.   MRN: [id number]  HPI 63 year old female presents the office today for concerns of toenail fungus.  States that she is currently on Lamisil and and she is about 2 months in the treatment She states that even starting this and she has noticed some improvement of her toenails.  She denies any pain to the toenails and she denies any redness or drainage or any swelling.  She also states that she has a recent history of infection in the left foot after being bitten by a cat.  She is been under the care of infectious disease as well as orthopedics and she has been released for this as the area has healed.  She denies any open sores currently denies any swelling or redness.   Review of Systems  All other systems reviewed and are negative.  Past Medical History:  Diagnosis Date  . Allergy   . Ankle fracture    left  . Arthritis    fingers, knees and hips  . Asthma   . Cellulitis    left foot  . GERD (gastroesophageal reflux disease)    OTC med prn  . Osteoporosis   . PONV (postoperative nausea and vomiting)   . Wears glasses     Past Surgical History:  Procedure Laterality Date  . ABDOMINAL HYSTERECTOMY    . I&D EXTREMITY Left 10/09/2017   Procedure: IRRIGATION AND DEBRIDEMENT LEFT FOOT;  Surgeon: Leandrew Koyanagi, MD;  Location: [facility];  Service: Orthopedics;  Laterality: Left;  . ORIF FIBULA FRACTURE Left 08/21/2016   Procedure: OPEN REDUCTION INTERNAL FIXATION (ORIF) DISTAL FIBULA FRACTURE;  Surgeon: Ninetta Lights, MD;  Location: [facility];  Service: Orthopedics;  Laterality: Left;  . ROTATOR CUFF REPAIR Right   . TONSILLECTOMY    . TUBAL LIGATION       Current Outpatient Medications:  .  celecoxib (CELEBREX) 200 MG capsule, TAKE 1 CAPSULE BY MOUTH EVERY DAY, Disp: 90 capsule, Rfl: 3 .  escitalopram (LEXAPRO) 20 MG tablet, Take 1 tablet (20 mg total) by mouth daily., Disp: 90  tablet, Rfl: 3 .  esomeprazole (NEXIUM) 20 MG capsule, Take 20 mg by mouth daily at 12 noon., Disp: , Rfl:  .  fexofenadine (ALLEGRA) 180 MG tablet, Take 180 mg by mouth daily., Disp: , Rfl:  .  montelukast (SINGULAIR) 10 MG tablet, Take 1 tablet (10 mg total) by mouth at bedtime., Disp: 30 tablet, Rfl: 3 .  Multiple Vitamin (MULTIVITAMIN WITH MINERALS) TABS tablet, Take 1 tablet by mouth daily., Disp: , Rfl:  .  terbinafine (LAMISIL) 250 MG tablet, Take 1 tablet (250 mg total) by mouth daily., Disp: 30 tablet, Rfl: 2  Allergies  Allergen Reactions  . Erythromycin Nausea And Vomiting and Other (See Comments)    GI UPSET         Objective:   Physical Exam General: AAO x3, NAD  Dermatological: Nails appear to be hypertrophic, dystrophic and discolored with yellow-brown discoloration.  Subjectively she does state that the nails are getting better than what they were.  There is no pain in the nails there is no surrounding redness or drainage or any signs of infection.  The scar from the previous infection the left foot is well-healed.  No open lesions. No active signs of infection.   Vascular: Dorsalis Pedis artery and Posterior Tibial artery pedal pulses are 2/4 bilateral with immedate  capillary fill time. There is no pain with calf compression, swelling, warmth, erythema.   Neruologic: Grossly intact via light touch bilateral. Protective threshold with Semmes Wienstein monofilament intact to all pedal sites bilateral.   Musculoskeletal: No gross boney pedal deformities bilateral. No pain, crepitus, or limitation noted with foot and ankle range of motion bilateral. Muscular strength 5/5 in all groups tested bilateral.  Gait: Unassisted, Nonantalgic.     Assessment & Plan:  62 year old female with onychomycosis -Treatment options discussed including all alternatives, risks, and complications -Etiology of symptoms were discussed -.  I want her to continue this for total of 90 days for a  wound check and take a look at the nails before she finishes this course.  Discussed with her possible extending the Lamisil depending how she is doing but if we do this we will recheck a CBC and LFT.  We also discussed other methods to help with the fungus.  Discussed vinegar diluted soaks, change in shoes on a regular basis as well as different topical ointments.  We also discussed laser therapy.  Trula Slade DPM

## 2018-03-02 ENCOUNTER — Telehealth: Payer: Self-pay | Admitting: Family Medicine

## 2018-03-02 ENCOUNTER — Encounter: Payer: Self-pay | Admitting: Family Medicine

## 2018-03-02 DIAGNOSIS — Z780 Asymptomatic menopausal state: Secondary | ICD-10-CM

## 2018-03-02 NOTE — Telephone Encounter (Signed)
Patient called in wanting to know if we could order a Bone Density for her. Please advise? She would like to have this performed at [facility]

## 2018-03-02 NOTE — Telephone Encounter (Signed)
Patient called in wanting to know if we could order a Bone Density for her. Please advise? She would like to have this performed at [facility]

## 2018-03-02 NOTE — Telephone Encounter (Signed)
ok 

## 2018-03-03 NOTE — Telephone Encounter (Signed)
Orders placed and will be faxed to [facility].

## 2018-03-03 NOTE — Telephone Encounter (Signed)
Left a message for patient to return call so that I may find out when she would like to have this scheduled

## 2018-03-17 ENCOUNTER — Inpatient Hospital Stay: Payer: BC Managed Care – PPO | Admitting: Internal Medicine

## 2018-03-18 ENCOUNTER — Ambulatory Visit: Payer: BC Managed Care – PPO | Admitting: Podiatry

## 2018-03-18 ENCOUNTER — Encounter: Payer: Self-pay | Admitting: Podiatry

## 2018-03-18 DIAGNOSIS — Z79899 Other long term (current) drug therapy: Secondary | ICD-10-CM | POA: Diagnosis not present

## 2018-03-18 DIAGNOSIS — B351 Tinea unguium: Secondary | ICD-10-CM

## 2018-03-18 DIAGNOSIS — R200 Anesthesia of skin: Secondary | ICD-10-CM | POA: Diagnosis not present

## 2018-03-18 LAB — CBC WITH DIFFERENTIAL/PLATELET
BASOS ABS: 41 {cells}/uL (ref 0–200)
Basophils Relative: 0.9 %
EOS ABS: 131 {cells}/uL (ref 15–500)
Eosinophils Relative: 2.9 %
HEMATOCRIT: 34.9 % — AB (ref 35.0–45.0)
HEMOGLOBIN: 11.9 g/dL (ref 11.7–15.5)
Lymphs Abs: 936 cells/uL (ref 850–3900)
MCH: 29.8 pg (ref 27.0–33.0)
MCHC: 34.1 g/dL (ref 32.0–36.0)
MCV: 87.3 fL (ref 80.0–100.0)
MPV: 9.2 fL (ref 7.5–12.5)
Monocytes Relative: 12.4 %
Neutro Abs: 2835 cells/uL (ref 1500–7800)
Neutrophils Relative %: 63 %
Platelets: 255 10*3/uL (ref 140–400)
RBC: 4 10*6/uL (ref 3.80–5.10)
RDW: 12.1 % (ref 11.0–15.0)
Total Lymphocyte: 20.8 %
WBC: 4.5 10*3/uL (ref 3.8–10.8)
WBCMIX: 558 {cells}/uL (ref 200–950)

## 2018-03-18 LAB — HEPATIC FUNCTION PANEL
AG Ratio: 2 (calc) (ref 1.0–2.5)
ALT: 18 U/L (ref 6–29)
AST: 15 U/L (ref 10–35)
Albumin: 4.3 g/dL (ref 3.6–5.1)
Alkaline phosphatase (APISO): 84 U/L (ref 33–130)
BILIRUBIN DIRECT: 0.1 mg/dL (ref 0.0–0.2)
BILIRUBIN TOTAL: 0.5 mg/dL (ref 0.2–1.2)
GLOBULIN: 2.2 g/dL (ref 1.9–3.7)
Indirect Bilirubin: 0.4 mg/dL (calc) (ref 0.2–1.2)
Total Protein: 6.5 g/dL (ref 6.1–8.1)

## 2018-03-18 NOTE — Patient Instructions (Signed)

## 2018-03-18 NOTE — Progress Notes (Signed)
Subjective: 62 year old female presents the office for follow-up evaluation after starting Lamisil.  She denies any side effects the medication she does that overall her nails are improving.  Denies any pain in the nails and denies any redness or drainage or any swelling.  Also she is a scar on the top of her left foot from where she had bit by a cat she gets some numbness still there and she is been released by her physicians for this.  enies any systemic complaints such as fevers, chills, nausea, vomiting. No acute changes since last appointment, and no other complaints at this time.   Objective: AAO x3, NAD DP/PT pulses palpable bilaterally, CRT less than 3 seconds The nails are painted today which limits evaluation but she does state that they are getting better.  There is no pain in the nails there is no surrounding redness or drainage or any signs of infection present. Scar is present in the dorsal aspect the left foot.  Subjective there is some numbness but there is no erythema or any clinical signs of infection. No open lesions or pre-ulcerative lesions.  No pain with calf compression, swelling, warmth, erythema  Assessment: Onychomycosis, numbness left foot  Plan: -All treatment options discussed with the patient including all alternatives, risks, complications.  -Discussed numbness after the injury can take at least a year to improve and she will likely have some permanent numbness but there is no signs of infection currently.  We will continue to monitor. -We will recheck CBC and LFT.  She is tolerating Lamisil well.  She needs to extend it. -Patient encouraged to call the office with any questions, concerns, change in symptoms.   Trula Slade DPM

## 2018-03-23 ENCOUNTER — Telehealth: Payer: Self-pay | Admitting: *Deleted

## 2018-03-23 NOTE — Telephone Encounter (Signed)
Called and spoke with the patient yesterday and stated that the blood work for the lamisil was normal and that the patient can continue the medicine and if any concerns or questions to call the office. Lattie Haw

## 2018-03-30 NOTE — Telephone Encounter (Signed)
Left message return call

## 2018-04-02 NOTE — Telephone Encounter (Signed)
Closing message as patient has not returned my call

## 2018-04-15 ENCOUNTER — Ambulatory Visit: Payer: BC Managed Care – PPO | Admitting: Family Medicine

## 2018-04-15 ENCOUNTER — Encounter: Payer: Self-pay | Admitting: Family Medicine

## 2018-04-15 VITALS — BP 100/60 | HR 84 | Temp 98.3°F | Resp 14 | Ht 66.0 in | Wt 183.0 lb

## 2018-04-15 DIAGNOSIS — S161XXA Strain of muscle, fascia and tendon at neck level, initial encounter: Secondary | ICD-10-CM | POA: Diagnosis not present

## 2018-04-15 MED ORDER — MUPIROCIN 2 % EX OINT: 1.0000 "application " | TOPICAL_OINTMENT | Freq: Two times a day (BID) | CUTANEOUS | 0 refills | Status: DC

## 2018-04-15 MED ORDER — DIAZEPAM 10 MG PO TABS: 10.0000 mg | ORAL_TABLET | Freq: Three times a day (TID) | ORAL | 0 refills | Status: DC | PRN

## 2018-04-15 NOTE — Progress Notes (Signed)
Subjective:    Patient ID: Madison Bryant, female    DOB: 07/25/1955, 62 y.o.   MRN: [id number]  HPI Patient has been dealing with pain in the left side of her neck now for several weeks.  She has been under tremendous stress caring for her sister who is been hospitalized.  She is not sleeping well.  She is developed an aching pain in the left side of her neck within the body of the muscle of the sternocleidomastoid and trapezius muscle.  It hurts to turn her chin to the left-hand side.  She denies any numbness or tingling radiating down the left side of her neck into her fingers.  She denies any weakness in her left arm.  She denies any falls or injury to her neck.  She denies any fevers or chills.  She denies any difficulty swallowing.  She denies any trouble breathing.  She also has a sore within her left nostril.  On examination there is an erythematous bump on the lateral wall of the left nostril approximately 2 mm in diameter.  There is no pus.  Although it is tender to touch. Past Medical History:  Diagnosis Date  . Allergy   . Ankle fracture    left  . Arthritis    fingers, knees and hips  . Asthma   . Cellulitis    left foot  . GERD (gastroesophageal reflux disease)    OTC med prn  . Osteoporosis   . PONV (postoperative nausea and vomiting)   . Wears glasses    Past Surgical History:  Procedure Laterality Date  . ABDOMINAL HYSTERECTOMY    . I&D EXTREMITY Left 10/09/2017   Procedure: IRRIGATION AND DEBRIDEMENT LEFT FOOT;  Surgeon: Leandrew Koyanagi, MD;  Location: [facility];  Service: Orthopedics;  Laterality: Left;  . ORIF FIBULA FRACTURE Left 08/21/2016   Procedure: OPEN REDUCTION INTERNAL FIXATION (ORIF) DISTAL FIBULA FRACTURE;  Surgeon: Ninetta Lights, MD;  Location: [place];  Service: Orthopedics;  Laterality: Left;  . ROTATOR CUFF REPAIR Right   . TONSILLECTOMY    . TUBAL LIGATION     Current Outpatient Medications on File Prior to Visit  Medication Sig  Dispense Refill  . Apple Cider Vinegar 500 MG TABS Take by mouth.    . celecoxib (CELEBREX) 200 MG capsule TAKE 1 CAPSULE BY MOUTH EVERY DAY 90 capsule 3  . Cyanocobalamin (B-12) 1000 MCG TABS Take by mouth.    . escitalopram (LEXAPRO) 20 MG tablet Take 1 tablet (20 mg total) by mouth daily. 90 tablet 3  . fexofenadine (ALLEGRA) 180 MG tablet Take 180 mg by mouth daily.    . montelukast (SINGULAIR) 10 MG tablet Take 1 tablet (10 mg total) by mouth at bedtime. 30 tablet 3  . Multiple Vitamin (MULTIVITAMIN WITH MINERALS) TABS tablet Take 1 tablet by mouth daily.    . Multiple Vitamins-Minerals (OCUVITE PRESERVISION PO) Take by mouth.    . terbinafine (LAMISIL) 250 MG tablet Take 1 tablet (250 mg total) by mouth daily. 30 tablet 2  . TURMERIC PO Take by mouth.    . esomeprazole (NEXIUM) 20 MG capsule Take 20 mg by mouth daily at 12 noon.     No current facility-administered medications on file prior to visit.    Allergies  Allergen Reactions  . Erythromycin Nausea And Vomiting and Other (See Comments)    GI UPSET   Social History   Socioeconomic History  . Marital status: Married  Spouse name: Not on file  . Number of children: Not on file  . Years of education: Not on file  . Highest education level: Not on file  Occupational History  . Not on file  Social Needs  . Financial resource strain: Not on file  . Food insecurity:    Worry: Not on file    Inability: Not on file  . Transportation needs:    Medical: Not on file    Non-medical: Not on file  Tobacco Use  . Smoking status: Never Smoker  . Smokeless tobacco: Never Used  Substance and Sexual Activity  . Alcohol use: No  . Drug use: No  . Sexual activity: Yes  Lifestyle  . Physical activity:    Days per week: Not on file    Minutes per session: Not on file  . Stress: Not on file  Relationships  . Social connections:    Talks on phone: Not on file    Gets together: Not on file    Attends religious service: Not on  file    Active member of club or organization: Not on file    Attends meetings of clubs or organizations: Not on file    Relationship status: Not on file  . Intimate partner violence:    Fear of current or ex partner: Not on file    Emotionally abused: Not on file    Physically abused: Not on file    Forced sexual activity: Not on file  Other Topics Concern  . Not on file  Social History Narrative  . Not on file      Review of Systems  All other systems reviewed and are negative.      Objective:   Physical Exam  Constitutional: She appears well-developed and well-nourished.  HENT:  Nose: Sinus tenderness present.    Neck: No neck rigidity. Decreased range of motion present. No edema and no erythema present.    Cardiovascular: Normal rate, regular rhythm and normal heart sounds.  Pulmonary/Chest: Effort normal and breath sounds normal.  Musculoskeletal:       Cervical back: She exhibits decreased range of motion, tenderness and pain. She exhibits no bony tenderness.  Vitals reviewed.         Assessment & Plan:  Acute strain of neck muscle, initial encounter  Recommended trying a combination of Valium 5 mg to 10 mg p.o. nightly as needed for 1 week and ibuprofen 800 mg every 8 hours.  Also recommended using heat to the affected area to relax the muscle.  If no better in 1 week, would proceed with an x-ray.  We will treat the lesion inside her left nostril with Bactroban applied twice daily for 5 to 7 days.

## 2018-05-21 ENCOUNTER — Encounter: Payer: Self-pay | Admitting: Family Medicine

## 2018-05-21 ENCOUNTER — Ambulatory Visit: Payer: BC Managed Care – PPO | Admitting: Family Medicine

## 2018-05-21 VITALS — BP 100/68 | HR 84 | Temp 98.0°F | Resp 16 | Ht 66.0 in | Wt 179.0 lb

## 2018-05-21 DIAGNOSIS — J069 Acute upper respiratory infection, unspecified: Secondary | ICD-10-CM

## 2018-05-21 DIAGNOSIS — L259 Unspecified contact dermatitis, unspecified cause: Secondary | ICD-10-CM | POA: Diagnosis not present

## 2018-05-21 DIAGNOSIS — B351 Tinea unguium: Secondary | ICD-10-CM

## 2018-05-21 MED ORDER — TERBINAFINE HCL 250 MG PO TABS: 250.0000 mg | ORAL_TABLET | Freq: Every day | ORAL | 2 refills | Status: DC

## 2018-05-21 MED ORDER — LEVOCETIRIZINE DIHYDROCHLORIDE 5 MG PO TABS: 5.0000 mg | ORAL_TABLET | Freq: Every evening | ORAL | 0 refills | Status: DC

## 2018-05-21 MED ORDER — TRIAMCINOLONE ACETONIDE 0.1 % EX CREA: 1.0000 "application " | TOPICAL_CREAM | Freq: Two times a day (BID) | CUTANEOUS | 0 refills | Status: DC

## 2018-05-21 NOTE — Progress Notes (Signed)
Subjective:    Patient ID: Madison Bryant, female    DOB: 1956-03-28, 63 y.o.   MRN: [id number]  HPI Patient has an erythematous small papular rash with excoriation on the volar surface of her left wrist, behind her right ear and on her right helix.  The rash is small.  The patches are less than 4 cm in diameter.  It is extremely itchy.  It has been there for more than a week.  There are no sick contacts.  No one else in her family is itching.  She has tried nothing on the rash yet.  She also reports chest congestion and head congestion and rhinorrhea.  Cough is nonproductive.  She denies any shortness of breath fevers or chills.  She does have rhinorrhea Past Medical History:  Diagnosis Date  . Allergy   . Ankle fracture    left  . Arthritis    fingers, knees and hips  . Asthma   . Cellulitis    left foot  . GERD (gastroesophageal reflux disease)    OTC med prn  . Osteoporosis   . PONV (postoperative nausea and vomiting)   . Wears glasses    Past Surgical History:  Procedure Laterality Date  . ABDOMINAL HYSTERECTOMY    . I&D EXTREMITY Left 10/09/2017   Procedure: IRRIGATION AND DEBRIDEMENT LEFT FOOT;  Surgeon: Leandrew Koyanagi, MD;  Location: [facility];  Service: Orthopedics;  Laterality: Left;  . ORIF FIBULA FRACTURE Left 08/21/2016   Procedure: OPEN REDUCTION INTERNAL FIXATION (ORIF) DISTAL FIBULA FRACTURE;  Surgeon: Ninetta Lights, MD;  Location: [facility];  Service: Orthopedics;  Laterality: Left;  . ROTATOR CUFF REPAIR Right   . TONSILLECTOMY    . TUBAL LIGATION     Current Outpatient Medications on File Prior to Visit  Medication Sig Dispense Refill  . Apple Cider Vinegar 500 MG TABS Take by mouth.    . celecoxib (CELEBREX) 200 MG capsule TAKE 1 CAPSULE BY MOUTH EVERY DAY 90 capsule 3  . Cyanocobalamin (B-12) 1000 MCG TABS Take by mouth.    . diazepam (VALIUM) 10 MG tablet Take 1 tablet (10 mg total) by mouth every 8 (eight) hours as needed (muscle spasm). 30  tablet 0  . escitalopram (LEXAPRO) 20 MG tablet Take 1 tablet (20 mg total) by mouth daily. 90 tablet 3  . esomeprazole (NEXIUM) 20 MG capsule Take 20 mg by mouth daily at 12 noon.    . fexofenadine (ALLEGRA) 180 MG tablet Take 180 mg by mouth daily.    . montelukast (SINGULAIR) 10 MG tablet Take 1 tablet (10 mg total) by mouth at bedtime. 30 tablet 3  . Multiple Vitamin (MULTIVITAMIN WITH MINERALS) TABS tablet Take 1 tablet by mouth daily.    . Multiple Vitamins-Minerals (OCUVITE PRESERVISION PO) Take by mouth.    . mupirocin ointment (BACTROBAN) 2 % Place 1 application into the nose 2 (two) times daily. 22 g 0  . terbinafine (LAMISIL) 250 MG tablet Take 1 tablet (250 mg total) by mouth daily. 30 tablet 2  . TURMERIC PO Take by mouth.     No current facility-administered medications on file prior to visit.    Allergies  Allergen Reactions  . Erythromycin Nausea And Vomiting and Other (See Comments)    GI UPSET   Social History   Socioeconomic History  . Marital status: Married    Spouse name: Not on file  . Number of children: Not on file  . Years  of education: Not on file  . Highest education level: Not on file  Occupational History  . Not on file  Social Needs  . Financial resource strain: Not on file  . Food insecurity:    Worry: Not on file    Inability: Not on file  . Transportation needs:    Medical: Not on file    Non-medical: Not on file  Tobacco Use  . Smoking status: Never Smoker  . Smokeless tobacco: Never Used  Substance and Sexual Activity  . Alcohol use: No  . Drug use: No  . Sexual activity: Yes  Lifestyle  . Physical activity:    Days per week: Not on file    Minutes per session: Not on file  . Stress: Not on file  Relationships  . Social connections:    Talks on phone: Not on file    Gets together: Not on file    Attends religious service: Not on file    Active member of club or organization: Not on file    Attends meetings of clubs or  organizations: Not on file    Relationship status: Not on file  . Intimate partner violence:    Fear of current or ex partner: Not on file    Emotionally abused: Not on file    Physically abused: Not on file    Forced sexual activity: Not on file  Other Topics Concern  . Not on file  Social History Narrative  . Not on file     Review of Systems  All other systems reviewed and are negative.      Objective:   Physical Exam  Constitutional: She appears well-developed and well-nourished. No distress.  HENT:  Head:    Eyes: Conjunctivae are normal.  Cardiovascular: Normal rate, regular rhythm and normal heart sounds.  No murmur heard. Pulmonary/Chest: Effort normal and breath sounds normal. No respiratory distress. She has no decreased breath sounds. She has no wheezes. She has no rhonchi. She has no rales.  Skin: Rash noted. Rash is papular and urticarial. She is not diaphoretic.     Vitals reviewed.        Assessment & Plan:  I think the patient has a contact dermatitis coupled with her viral upper respiratory infection.  Recommended treating the contact dermatitis with triamcinolone cream applied twice daily for 7 to 10 days.  Treat the itching and head congestion and chest congestion with Xyzal 5 mg a day and then recheck in 1 week if no better.  Patient has also been on Lamisil for 2 months.  The rash on her toenails is much better.  Her podiatrist recently checked her liver test and they were normal.  She is asking for 1 additional month which I will gladly refill

## 2018-06-12 ENCOUNTER — Other Ambulatory Visit: Payer: Self-pay | Admitting: Family Medicine

## 2018-08-06 ENCOUNTER — Other Ambulatory Visit: Payer: Self-pay | Admitting: Family Medicine

## 2018-08-06 MED ORDER — ESCITALOPRAM OXALATE 20 MG PO TABS: 20.0000 mg | ORAL_TABLET | Freq: Every day | ORAL | 3 refills | Status: DC

## 2018-11-11 ENCOUNTER — Ambulatory Visit: Payer: BC Managed Care – PPO | Admitting: Family Medicine

## 2018-11-11 ENCOUNTER — Other Ambulatory Visit: Payer: Self-pay

## 2018-11-11 ENCOUNTER — Encounter: Payer: Self-pay | Admitting: Family Medicine

## 2018-11-11 VITALS — BP 122/66 | HR 80 | Temp 97.9°F | Resp 14 | Ht 66.0 in | Wt 177.0 lb

## 2018-11-11 DIAGNOSIS — G8929 Other chronic pain: Secondary | ICD-10-CM

## 2018-11-11 DIAGNOSIS — F331 Major depressive disorder, recurrent, moderate: Secondary | ICD-10-CM | POA: Diagnosis not present

## 2018-11-11 DIAGNOSIS — M542 Cervicalgia: Secondary | ICD-10-CM

## 2018-11-11 DIAGNOSIS — M722 Plantar fascial fibromatosis: Secondary | ICD-10-CM

## 2018-11-11 DIAGNOSIS — M2550 Pain in unspecified joint: Secondary | ICD-10-CM

## 2018-11-11 DIAGNOSIS — B351 Tinea unguium: Secondary | ICD-10-CM

## 2018-11-11 MED ORDER — TERBINAFINE HCL 250 MG PO TABS
250.0000 mg | ORAL_TABLET | Freq: Every day | ORAL | 2 refills | Status: DC
Start: 2018-11-11 — End: 2020-01-17

## 2018-11-11 MED ORDER — DIAZEPAM 10 MG PO TABS: 10.0000 mg | ORAL_TABLET | Freq: Three times a day (TID) | ORAL | 0 refills | Status: DC | PRN

## 2018-11-11 MED ORDER — TRIAMCINOLONE ACETONIDE 0.1 % EX CREA: 1.0000 "application " | TOPICAL_CREAM | Freq: Two times a day (BID) | CUTANEOUS | 0 refills | Status: DC

## 2018-11-11 NOTE — Progress Notes (Addendum)
Subjective:    Patient ID: Madison Bryant, female    DOB: 03/03/1956, 62 y.o.   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 She has had increased pain in her neck, seen in Dec of last year diagnosed with neck strain, no injury.    Never had any imaging on neck  But started seeing a chiropracter once a week, which helped a little with some manipulations.  Still has same pain points to left side of neck over SCM region  no radiating pain into shoulder/arm, no paresthesia     Singulair and xyzal- stopped  As well 3 weeks ago was on for asthma allergies - states she is doing fine    Celebrex stopped, has history of polyarthralgia, was not sure if it was helping, but now has heel pain other joint pain besides the neck   Sister has lupus/ anti-phospholipid antobody  - Dr. 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 and had increased pain, no swelling   She  Still has some nail fungus-was on terbinafine  , fungus has cleared on most toes except 1 on right foot, wanted to refill, needs LFT      Donated blood yesterday was  Hb13.6     Review Of Systems:  GEN- + fatigue, denies fever, weight loss,weakness, recent illness HEENT- denies eye drainage, change in vision, nasal  discharge, CVS- denies chest pain, palpitations RESP- denies SOB, cough, wheeze ABD- denies N/V, change in stools, abd pain GU- denies dysuria, hematuria, dribbling, incontinence MSK- +joint pain, muscle aches, injury Neuro- denies headache, dizziness, syncope, seizure activity       Objective:    BP 122/66   Pulse 80   Temp 97.9 F (36.6 C) (Oral)   Resp 14   Ht 5\' 6"  (1.676 m)   Wt 177 lb (80.3 kg)   SpO2 97%   BMI 28.57 kg/m  GEN- NAD, alert and oriented x3 HEENT- PERRL, EOMI, non injected sclera, pink conjunctiva, MMM, oropharynx clear Neck- Supple, no thyromegaly, decreased ROM with lateral rotation, C spine NT, neg spurlings  CVS- RRR, no murmur RESP-CTAB Psych- tearful depressed appearing, not anxious, no SI, well groomed, normal speech MSK- FROM Upper ext, TTP at plantar insertion bilat heels, FROM foot/ankle , no edema NEURO Sensation in tact, strength equal bilat UE  EXT- No edema, thickened nail, had polish on  Pulses- Radial, DP- 2+        Assessment & Plan:      Problem List Items Addressed This Visit      Unprioritized   MDD (major depressive disorder)    Pt very tearful as soon as I entered the room she began sobbing.  I  think she is signifcantly overwhelmed being caregiver but does not have any other options at this time. Recommend she restart lexapro at 10mg  at bedtime, f/u PCP in a couple of weeks For her other multiple complaints, will work up the auto-immune components to her polyarthralgia but I think her mood in general is causing worsening somatic complaints  Chronic neck pain- obtain xray c spine, restart celebrex for joint pain  Nail fungus- check LFT if normal, can do 1 more month of terbinafine         Relevant Medications   diazepam (VALIUM) 10 MG tablet   Onychomycosis   Relevant Medications   terbinafine (LAMISIL) 250 MG tablet   Other Relevant Orders   Comprehensive metabolic panel (Completed)    Other Visit Diagnoses     Chronic neck pain    -  Primary   Polyarthralgia       Relevant Orders   CBC with Differential/Platelet (Completed)   Comprehensive metabolic panel (Completed)   ANA   Rheumatoid factor (Completed)   Cardiolipin antibody   Sedimentation Rate (Completed)   C-reactive protein (Completed)      Note: This dictation was prepared with Dragon dictation along with smaller phrase technology. Any transcriptional errors that result from this process are unintentional.

## 2018-11-11 NOTE — Patient Instructions (Addendum)
We will check for autoimmune arthritis/lupus Liver check to be done, if normal you can start the terbinafine Ice and stretch the plantar fascia, restart the celebrex Restart lexapro at 10mg  at bedtime  Get xray done at [facility] for your neck F/U 4 weeks Dr. Dennard Schaumann

## 2018-11-12 ENCOUNTER — Encounter: Payer: Self-pay | Admitting: Family Medicine

## 2018-11-12 DIAGNOSIS — F329 Major depressive disorder, single episode, unspecified: Secondary | ICD-10-CM | POA: Insufficient documentation

## 2018-11-12 LAB — COMPREHENSIVE METABOLIC PANEL
AG Ratio: 2 (calc) (ref 1.0–2.5)
ALT: 16 U/L (ref 6–29)
AST: 19 U/L (ref 10–35)
Albumin: 4.3 g/dL (ref 3.6–5.1)
Alkaline phosphatase (APISO): 66 U/L (ref 37–153)
BUN: 12 mg/dL (ref 7–25)
CO2: 28 mmol/L (ref 20–32)
Calcium: 9.3 mg/dL (ref 8.6–10.4)
Chloride: 107 mmol/L (ref 98–110)
Creat: 0.79 mg/dL (ref 0.50–0.99)
Globulin: 2.1 g/dL (calc) (ref 1.9–3.7)
Glucose, Bld: 103 mg/dL — ABNORMAL HIGH (ref 65–99)
Potassium: 4.8 mmol/L (ref 3.5–5.3)
Sodium: 141 mmol/L (ref 135–146)
Total Bilirubin: 0.7 mg/dL (ref 0.2–1.2)
Total Protein: 6.4 g/dL (ref 6.1–8.1)

## 2018-11-12 LAB — CBC WITH DIFFERENTIAL/PLATELET
Absolute Monocytes: 375 cells/uL (ref 200–950)
Basophils Absolute: 40 cells/uL (ref 0–200)
Basophils Relative: 1.3 %
Eosinophils Absolute: 118 cells/uL (ref 15–500)
Eosinophils Relative: 3.8 %
HCT: 32.9 % — ABNORMAL LOW (ref 35.0–45.0)
Hemoglobin: 11.2 g/dL — ABNORMAL LOW (ref 11.7–15.5)
Lymphs Abs: 781 cells/uL — ABNORMAL LOW (ref 850–3900)
MCH: 30.1 pg (ref 27.0–33.0)
MCHC: 34 g/dL (ref 32.0–36.0)
MCV: 88.4 fL (ref 80.0–100.0)
MPV: 8.9 fL (ref 7.5–12.5)
Monocytes Relative: 12.1 %
Neutro Abs: 1786 cells/uL (ref 1500–7800)
Neutrophils Relative %: 57.6 %
Platelets: 283 10*3/uL (ref 140–400)
RBC: 3.72 10*6/uL — ABNORMAL LOW (ref 3.80–5.10)
RDW: 12.1 % (ref 11.0–15.0)
Total Lymphocyte: 25.2 %
WBC: 3.1 10*3/uL — ABNORMAL LOW (ref 3.8–10.8)

## 2018-11-12 LAB — C-REACTIVE PROTEIN: CRP: 5 mg/L (ref <8.0)

## 2018-11-12 LAB — SEDIMENTATION RATE: Sed Rate: 11 mm/h (ref 0–30)

## 2018-11-12 LAB — ANA: Anti Nuclear Antibody (ANA): NEGATIVE

## 2018-11-12 LAB — RHEUMATOID FACTOR: Rheumatoid fact SerPl-aCnc: <14 IU/mL (ref <14)

## 2018-11-12 NOTE — Assessment & Plan Note (Signed)
Pt very tearful as soon as I entered the room she began sobbing.  I think she is signifcantly overwhelmed being caregiver but does not have any other options at this time. Recommend she restart lexapro at 10mg  at bedtime, f/u PCP in a couple of weeks For her other multiple complaints, will work up the auto-immune components to her polyarthralgia but I think her mood in general is causing worsening somatic complaints  Chronic neck pain- obtain xray c spine, restart celebrex for joint pain  Nail fungus- check LFT if normal, can do 1 more month of terbinafine

## 2018-11-17 LAB — CARDIOLIPIN ANTIBODY: Phospholipids: 228 mg/dL (ref 151–264)

## 2018-12-08 ENCOUNTER — Ambulatory Visit
Admission: RE | Admit: 2018-12-08 | Discharge: 2018-12-08 | Disposition: A | Discharge status: Home / Self Care | Payer: BC Managed Care – PPO | Source: Ambulatory Visit | Attending: Family Medicine | Admitting: Family Medicine

## 2018-12-08 DIAGNOSIS — M542 Cervicalgia: Secondary | ICD-10-CM

## 2018-12-08 DIAGNOSIS — G8929 Other chronic pain: Secondary | ICD-10-CM

## 2018-12-13 ENCOUNTER — Other Ambulatory Visit: Payer: Self-pay

## 2018-12-13 ENCOUNTER — Encounter: Payer: Self-pay | Admitting: Family Medicine

## 2018-12-13 ENCOUNTER — Ambulatory Visit: Payer: BC Managed Care – PPO | Admitting: Family Medicine

## 2018-12-13 VITALS — BP 112/80 | HR 88 | Temp 98.5°F | Resp 14 | Ht 66.0 in | Wt 179.0 lb

## 2018-12-13 DIAGNOSIS — M542 Cervicalgia: Secondary | ICD-10-CM | POA: Diagnosis not present

## 2018-12-13 DIAGNOSIS — M5030 Other cervical disc degeneration, unspecified cervical region: Secondary | ICD-10-CM

## 2018-12-13 DIAGNOSIS — G8929 Other chronic pain: Secondary | ICD-10-CM | POA: Diagnosis not present

## 2018-12-13 MED ORDER — CYCLOBENZAPRINE HCL 10 MG PO TABS: 10.0000 mg | ORAL_TABLET | Freq: Three times a day (TID) | ORAL | 0 refills | Status: DC | PRN

## 2018-12-13 MED ORDER — DICLOFENAC SODIUM 75 MG PO TBEC: 75.0000 mg | DELAYED_RELEASE_TABLET | Freq: Two times a day (BID) | ORAL | 2 refills | Status: DC

## 2018-12-13 NOTE — Progress Notes (Signed)
Subjective:    Patient ID: Madison Bryant, female    DOB: 16-Jul-1955, 63 y.o.   MRN: [id number]  HPI Saw my partner 7/2. I have copied her A/P for my reference below:  MDD (major depressive disorder)     Pt very tearful as soon as I entered the room she began sobbing.  I think she is signifcantly overwhelmed being caregiver but does not have any other options at this time. Recommend she restart lexapro at 10mg  at bedtime, f/u PCP in a couple of weeks For her other multiple complaints, will work up the auto-immune components to her polyarthralgia but I think her mood in general is causing worsening somatic complaints  Chronic neck pain- obtain xray c spine, restart celebrex for joint pain  Nail fungus- check LFT if normal, can do 1 more month of terbinafine         Relevant Medications   diazepam (VALIUM) 10 MG tablet   Onychomycosis   Relevant Medications   terbinafine (LAMISIL) 250 MG tablet   Other Relevant Orders   Comprehensive metabolic panel (Completed)          Other Visit Diagnoses    Chronic neck pain    -  Primary   Polyarthralgia       Relevant Orders   CBC with Differential/Platelet (Completed)   Comprehensive metabolic panel (Completed)   ANA   Rheumatoid factor (Completed)   Cardiolipin antibody   Sedimentation Rate (Completed)   C-reactive protein (Completed)   Xray of c-spine revealed:  FINDINGS: Frontal, lateral, open-mouth odontoid, and bilateral oblique views were obtained. There is no evident fracture or spondylolisthesis. Prevertebral soft tissues and predental space regions are normal. There is moderately severe disc space narrowing at C3-4, C4-5, C5-6, and C6-7. There is milder disc space narrowing at C7-T1. There is facet osteoarthritic change with exit foraminal narrowing due to bony hypertrophy at C3-4, C4-5, C5-6, and C6-7 bilaterally. No erosive changes. Lung apices are clear.  Labs showed a sed rate of  11 and autoimmune labs were normal.    Patient is here today for follow-up.  The stress may be slightly better since starting the Lexapro back.  However she continues to have pain in her neck.  The pain is located in her left neck on the lower lateral aspect radiating into the trapezius and the upper posterior left shoulder.  She also has pain radiating up the left side of the neck around the ear and into the occiput.  The pain is worse at night when she tries to sleep.  She has a difficult time getting her head into a comfortable position that will allow her to sleep.  She denies any weakness in her arms..  She denies any numbness or tingling radiating down her arm.  She denies any burning paresthesias. Past Medical History:  Diagnosis Date  . Allergy   . Ankle fracture    left  . Arthritis    fingers, knees and hips  . Asthma   . Cellulitis    left foot  . GERD (gastroesophageal reflux disease)    OTC med prn  . Osteoporosis   . PONV (postoperative nausea and vomiting)   . Wears glasses    Past Surgical History:  Procedure Laterality Date  . ABDOMINAL HYSTERECTOMY    . I&D EXTREMITY Left 10/09/2017   Procedure: IRRIGATION AND DEBRIDEMENT LEFT FOOT;  Surgeon: Leandrew Koyanagi, MD;  Location: [facility];  Service: Orthopedics;  Laterality: Left;  .  ORIF FIBULA FRACTURE Left 08/21/2016   Procedure: OPEN REDUCTION INTERNAL FIXATION (ORIF) DISTAL FIBULA FRACTURE;  Surgeon: Ninetta Lights, MD;  Location: [facility];  Service: Orthopedics;  Laterality: Left;  . ROTATOR CUFF REPAIR Right   . TONSILLECTOMY    . TUBAL LIGATION     Current Outpatient Medications on File Prior to Visit  Medication Sig Dispense Refill  . Apple Cider Vinegar 500 MG TABS Take by mouth.    . celecoxib (CELEBREX) 200 MG capsule TAKE 1 CAPSULE BY MOUTH EVERY DAY 90 capsule 3  . Cyanocobalamin (B-12) 1000 MCG TABS Take by mouth.    . diazepam (VALIUM) 10 MG tablet Take 1 tablet (10 mg total) by mouth every 8  (eight) hours as needed (muscle spasm). 30 tablet 0  . escitalopram (LEXAPRO) 20 MG tablet Take 1 tablet (20 mg total) by mouth daily. 90 tablet 3  . esomeprazole (NEXIUM) 20 MG capsule Take 20 mg by mouth daily at 12 noon.    . fexofenadine (ALLEGRA) 180 MG tablet Take 180 mg by mouth daily.    Marland Kitchen levocetirizine (XYZAL) 5 MG tablet TAKE 1 TABLET BY MOUTH EVERY DAY IN THE EVENING 90 tablet 3  . montelukast (SINGULAIR) 10 MG tablet Take 1 tablet (10 mg total) by mouth at bedtime. 30 tablet 3  . Multiple Vitamin (MULTIVITAMIN WITH MINERALS) TABS tablet Take 1 tablet by mouth daily.    . Multiple Vitamins-Minerals (OCUVITE PRESERVISION PO) Take by mouth.    . terbinafine (LAMISIL) 250 MG tablet Take 1 tablet (250 mg total) by mouth daily. 30 tablet 2  . triamcinolone cream (KENALOG) 0.1 % Apply 1 application topically 2 (two) times daily. 30 g 0  . TURMERIC PO Take by mouth.     No current facility-administered medications on file prior to visit.    Allergies  Allergen Reactions  . Erythromycin Nausea And Vomiting and Other (See Comments)    GI UPSET   Social History   Socioeconomic History  . Marital status: Married    Spouse name: Not on file  . Number of children: Not on file  . Years of education: Not on file  . Highest education level: Not on file  Occupational History  . Not on file  Social Needs  . Financial resource strain: Not on file  . Food insecurity    Worry: Not on file    Inability: Not on file  . Transportation needs    Medical: Not on file    Non-medical: Not on file  Tobacco Use  . Smoking status: Never Smoker  . Smokeless tobacco: Never Used  Substance and Sexual Activity  . Alcohol use: No  . Drug use: No  . Sexual activity: Yes  Lifestyle  . Physical activity    Days per week: Not on file    Minutes per session: Not on file  . Stress: Not on file  Relationships  . Social Herbalist on phone: Not on file    Gets together: Not on file     Attends religious service: Not on file    Active member of club or organization: Not on file    Attends meetings of clubs or organizations: Not on file    Relationship status: Not on file  . Intimate partner violence    Fear of current or ex partner: Not on file    Emotionally abused: Not on file    Physically abused: Not on file  Forced sexual activity: Not on file  Other Topics Concern  . Not on file  Social History Narrative  . Not on file      Review of Systems  All other systems reviewed and are negative.      Objective:   Physical Exam Vitals signs reviewed.  Constitutional:      General: She is not in acute distress.    Appearance: Normal appearance. She is not ill-appearing or toxic-appearing.  Neck:     Musculoskeletal: Decreased range of motion. No pain with movement or spinous process tenderness.     Thyroid: No thyroid mass.  Cardiovascular:     Rate and Rhythm: Normal rate and regular rhythm.     Heart sounds: Normal heart sounds.  Pulmonary:     Effort: Pulmonary effort is normal. No respiratory distress.     Breath sounds: Normal breath sounds. No stridor. No wheezing, rhonchi or rales.  Chest:     Chest wall: No tenderness.  Neurological:     Mental Status: She is alert.           Assessment & Plan:  The primary encounter diagnosis was DDD (degenerative disc disease), cervical. A diagnosis of Chronic neck pain was also pertinent to this visit. Recommended we discontinue Celebrex and try replacing it with diclofenac 75 mg twice a day.  We will also try Flexeril 10 mg every 8 hours as needed muscle spasms in the neck but primarily I would like her to take this at night prior to bed.  Reassess via telephone in 1 month.  If symptoms are unmanageable, the next that would be an MRI and possible epidural steroid injections in the neck.  Discussed the risk of GI bleeding due to chronic NSAID use.

## 2018-12-20 ENCOUNTER — Other Ambulatory Visit: Payer: Self-pay

## 2018-12-21 ENCOUNTER — Ambulatory Visit (INDEPENDENT_AMBULATORY_CARE_PROVIDER_SITE_OTHER): Payer: BC Managed Care – PPO | Admitting: Family Medicine

## 2018-12-21 ENCOUNTER — Other Ambulatory Visit: Payer: Self-pay

## 2018-12-21 DIAGNOSIS — J069 Acute upper respiratory infection, unspecified: Secondary | ICD-10-CM

## 2018-12-21 DIAGNOSIS — R059 Cough, unspecified: Secondary | ICD-10-CM

## 2018-12-21 DIAGNOSIS — Z20822 Contact with and (suspected) exposure to covid-19: Secondary | ICD-10-CM

## 2018-12-21 DIAGNOSIS — R05 Cough: Secondary | ICD-10-CM

## 2018-12-21 NOTE — Progress Notes (Addendum)
Subjective:    Patient ID: Madison Bryant, female    DOB: 12-23-1955, 63 y.o.   MRN: [id number]  HPI  Patient was scheduled today to be seen as a telephone visit.  I originally try to contact the patient at 10:00 although her visit was at 1015 visit.  I went straight to voicemail.  I tried to reach the patient again at 1015, 1019, 1038, and 11:00.  I have left 3 different messages and have been unable to successfully reach the patient.  Therefore I will asked the patient to call us back when she gets this message and we will try to reschedule her visit for her.  Tried again at 1430 and phone went straight to voicemail.  Left another message.    Finally was able to reach the patient by contacting her husband cell phone.  Thankfully he was at home with the patient and I was able to reach her.  Phone call began at 1500.  Phone call concluded at 1510.  Patient consents to be seen by telephone.  Patient states that her symptoms began Sunday with a sore throat.  She also developed a cough productive of green mucus.  She continues to have a cough.  This is the third day of symptoms.  She denies any chest pain.  She denies any shortness of breath.  She denies any objective fever.  She does report feeling sick and weak.  She cares for her grandchild.  She denies any other exposure.  She denies any nausea or vomiting or diarrhea.  Past Medical History:  Diagnosis Date  . Allergy   . Ankle fracture    left  . Arthritis    fingers, knees and hips  . Asthma   . Cellulitis    left foot  . GERD (gastroesophageal reflux disease)    OTC med prn  . Osteoporosis   . PONV (postoperative nausea and vomiting)   . Wears glasses    Past Surgical History:  Procedure Laterality Date  . ABDOMINAL HYSTERECTOMY    . I&D EXTREMITY Left 10/09/2017   Procedure: IRRIGATION AND DEBRIDEMENT LEFT FOOT;  Surgeon: Leandrew Koyanagi, MD;  Location: [place];  Service: Orthopedics;  Laterality: Left;  . ORIF FIBULA FRACTURE  Left 08/21/2016   Procedure: OPEN REDUCTION INTERNAL FIXATION (ORIF) DISTAL FIBULA FRACTURE;  Surgeon: Ninetta Lights, MD;  Location: [place];  Service: Orthopedics;  Laterality: Left;  . ROTATOR CUFF REPAIR Right   . TONSILLECTOMY    . TUBAL LIGATION     Current Outpatient Medications on File Prior to Visit  Medication Sig Dispense Refill  . Apple Cider Vinegar 500 MG TABS Take by mouth.    . celecoxib (CELEBREX) 200 MG capsule TAKE 1 CAPSULE BY MOUTH EVERY DAY 90 capsule 3  . Cyanocobalamin (B-12) 1000 MCG TABS Take by mouth.    . cyclobenzaprine (FLEXERIL) 10 MG tablet Take 1 tablet (10 mg total) by mouth 3 (three) times daily as needed for muscle spasms. 60 tablet 0  . diazepam (VALIUM) 10 MG tablet Take 1 tablet (10 mg total) by mouth every 8 (eight) hours as needed (muscle spasm). 30 tablet 0  . diclofenac (VOLTAREN) 75 MG EC tablet Take 1 tablet (75 mg total) by mouth 2 (two) times daily. Stop celebrex 60 tablet 2  . escitalopram (LEXAPRO) 20 MG tablet Take 1 tablet (20 mg total) by mouth daily. 90 tablet 3  . esomeprazole (NEXIUM) 20 MG capsule Take 20  mg by mouth daily at 12 noon.    . fexofenadine (ALLEGRA) 180 MG tablet Take 180 mg by mouth daily.    Marland Kitchen levocetirizine (XYZAL) 5 MG tablet TAKE 1 TABLET BY MOUTH EVERY DAY IN THE EVENING 90 tablet 3  . montelukast (SINGULAIR) 10 MG tablet Take 1 tablet (10 mg total) by mouth at bedtime. 30 tablet 3  . Multiple Vitamin (MULTIVITAMIN WITH MINERALS) TABS tablet Take 1 tablet by mouth daily.    . Multiple Vitamins-Minerals (OCUVITE PRESERVISION PO) Take by mouth.    . terbinafine (LAMISIL) 250 MG tablet Take 1 tablet (250 mg total) by mouth daily. 30 tablet 2  . triamcinolone cream (KENALOG) 0.1 % Apply 1 application topically 2 (two) times daily. 30 g 0  . TURMERIC PO Take by mouth.     No current facility-administered medications on file prior to visit.    Allergies  Allergen Reactions  . Erythromycin Nausea And  Vomiting and Other (See Comments)    GI UPSET   Social History   Socioeconomic History  . Marital status: Married    Spouse name: Not on file  . Number of children: Not on file  . Years of education: Not on file  . Highest education level: Not on file  Occupational History  . Not on file  Social Needs  . Financial resource strain: Not on file  . Food insecurity    Worry: Not on file    Inability: Not on file  . Transportation needs    Medical: Not on file    Non-medical: Not on file  Tobacco Use  . Smoking status: Never Smoker  . Smokeless tobacco: Never Used  Substance and Sexual Activity  . Alcohol use: No  . Drug use: No  . Sexual activity: Yes  Lifestyle  . Physical activity    Days per week: Not on file    Minutes per session: Not on file  . Stress: Not on file  Relationships  . Social Herbalist on phone: Not on file    Gets together: Not on file    Attends religious service: Not on file    Active member of club or organization: Not on file    Attends meetings of clubs or organizations: Not on file    Relationship status: Not on file  . Intimate partner violence    Fear of current or ex partner: Not on file    Emotionally abused: Not on file    Physically abused: Not on file    Forced sexual activity: Not on file  Other Topics Concern  . Not on file  Social History Narrative  . Not on file     Review of Systems  All other systems reviewed and are negative.      Objective:   Physical Exam   Unable to perform physical exam as the patient was seen as a telephone visit.  However she is audibly congested and coughing during her visit.  Patient does not sound toxic.  She is mentating appropriately.  She is in no respiratory distress     Assessment & Plan:  The primary encounter diagnosis was Cough. A diagnosis of URI, acute was also pertinent to this visit. Most likely the patient's symptoms are due to a viral upper respiratory infection.   I will refer the patient for COVID testing.  Recommend self quarantine until the results of her cover tested have returned.  Otherwise recommend symptomatic care with over-the-counter  cough medication and decongestants.  If the patient develops shortness of breath or chest pain she is directed to go to the emergency room.

## 2018-12-23 LAB — NOVEL CORONAVIRUS, NAA: SARS-CoV-2, NAA: NOT DETECTED

## 2019-01-26 ENCOUNTER — Ambulatory Visit: Payer: BC Managed Care – PPO | Admitting: Family Medicine

## 2019-01-26 ENCOUNTER — Other Ambulatory Visit: Payer: Self-pay

## 2019-01-26 ENCOUNTER — Encounter: Payer: Self-pay | Admitting: Family Medicine

## 2019-01-26 DIAGNOSIS — K219 Gastro-esophageal reflux disease without esophagitis: Secondary | ICD-10-CM | POA: Diagnosis not present

## 2019-01-26 DIAGNOSIS — Z23 Encounter for immunization: Secondary | ICD-10-CM | POA: Diagnosis not present

## 2019-01-26 DIAGNOSIS — K5900 Constipation, unspecified: Secondary | ICD-10-CM | POA: Diagnosis not present

## 2019-01-26 MED ORDER — ESOMEPRAZOLE MAGNESIUM 40 MG PO CPDR: 40.0000 mg | DELAYED_RELEASE_CAPSULE | Freq: Every day | ORAL | 6 refills | Status: DC

## 2019-01-26 MED ORDER — SHINGRIX 50 MCG/0.5ML IM SUSR: 0.5000 mL | Freq: Once | INTRAMUSCULAR | 1 refills | Status: AC

## 2019-01-26 NOTE — Patient Instructions (Addendum)
Take nexium  Use probiotics , increase water and fiber for bowels [facility] sent to pharmacy Flu shot given F/U as needed

## 2019-01-26 NOTE — Assessment & Plan Note (Signed)
For now we will have her increase her water fiber and she can add probiotics.  She will restart Nexium for the reflux I have given her 40 mg she can take daily or as needed if she can figure out the foods that are triggering her stomach.  No red flags on exam today.

## 2019-01-26 NOTE — Progress Notes (Signed)
   Subjective:    Patient ID: Madison Bryant, female    DOB: Dec 03, 1955, 63 y.o.   MRN: [id number]  Patient presents for Reflux (has had indigestion for years, but has worsened in the past few weeks)   Past few weeks has had increased GI upset, has burning sensation with reflux, had brash water taste in the of throat, no vomiting. Took TUMS to help.  Is worse after eating some baked onion rings.  She is out of nexium thinks prescription expired.  He is trying to avoid fried foods spicy foods    She does have constipation history had been regular until recently as well  She had a routine with bowel movement when she worked coffee typically made her go regularly.  She has not had any diarrhea stools   Still on lamisil for fingernails this is growing out         Review Of Systems:  GEN- denies fatigue, fever, weight loss,weakness, recent illness HEENT- denies eye drainage, change in vision, nasal discharge, CVS- denies chest pain, palpitations RESP- denies SOB, cough, wheeze ABD- denies N/V, +change in stools, abd pain GU- denies dysuria, hematuria, dribbling, incontinence MSK- denies joint pain, muscle aches, injury Neuro- denies headache, dizziness, syncope, seizure activity       Objective:    BP 112/62   Pulse 90   Temp 98.3 F (36.8 C) (Oral)   Resp 14   Ht 5\' 6"  (1.676 m)   Wt 182 lb (82.6 kg)   SpO2 97%   BMI 29.38 kg/m  GEN- NAD, alert and oriented x3 HEENT- PERRL, EOMI, non injected sclera, pink conjunctiva, MMM, oropharynx clear CVS- RRR, no murmur RESP-CTAB ABD-NABS,soft,NT,ND EXT- No edema Pulses- Radial  2+        Assessment & Plan:      Problem List Items Addressed This Visit      Unprioritized   Constipation    For now we will have her increase her water fiber and she can add probiotics.  She will restart Nexium for the reflux I have given her 40 mg she can take daily or as needed if she can figure out the foods that are triggering her  stomach.  No red flags on exam today.      GERD (gastroesophageal reflux disease)   Relevant Medications   esomeprazole (NEXIUM) 40 MG capsule      Note: This dictation was prepared with Dragon dictation along with smaller phrase technology. Any transcriptional errors that result from this process are unintentional.

## 2019-02-07 ENCOUNTER — Other Ambulatory Visit: Payer: Self-pay | Admitting: Family Medicine

## 2019-02-07 DIAGNOSIS — B351 Tinea unguium: Secondary | ICD-10-CM

## 2019-03-01 ENCOUNTER — Other Ambulatory Visit: Payer: Self-pay

## 2019-03-01 ENCOUNTER — Ambulatory Visit: Payer: BC Managed Care – PPO | Admitting: Family Medicine

## 2019-03-01 ENCOUNTER — Encounter: Payer: Self-pay | Admitting: Family Medicine

## 2019-03-01 ENCOUNTER — Telehealth: Payer: Self-pay | Admitting: *Deleted

## 2019-03-01 VITALS — BP 110/68 | HR 86 | Temp 97.9°F | Resp 16 | Ht 66.0 in | Wt 182.0 lb

## 2019-03-01 DIAGNOSIS — M1811 Unilateral primary osteoarthritis of first carpometacarpal joint, right hand: Secondary | ICD-10-CM | POA: Diagnosis not present

## 2019-03-01 MED ORDER — DICLOFENAC SODIUM 1 % TD GEL: 2.0000 g | Freq: Four times a day (QID) | TRANSDERMAL | 1 refills | Status: DC

## 2019-03-01 NOTE — Progress Notes (Signed)
Subjective:    Patient ID: Madison Bryant, female    DOB: 1955-07-22, 63 y.o.   MRN: [id number]  HPI  Patient presents today with 2 concerns.  Problem #1, she has noticed a painless mass on the volar surface of the right third finger.  The object is located between the MCP joint and the PIP joint.  Is approximately 3 to 4 mm.  It is rounded and freely mobile.  It appears to be adherent however to the flexor tendon.  It is not painful.  It is very well-circumscribed.  It feels like a ganglion cyst.  Problem #2, she has pain at the right first [facility] joint.  She has pain with ulnar deviation of the wrist at the first [facility] joint.  She has pain with Finkelstein's maneuver.  However there is no pain with resisted extension of the thumb.  I suspect that the patient has [place] joint arthritis.  Less likely would be de Quervain's tenosynovitis.  She has palpable crepitus at the [facility] joint with range of motion Past Medical History:  Diagnosis Date  . Allergy   . Ankle fracture    left  . Arthritis    fingers, knees and hips  . Asthma   . Cellulitis    left foot  . GERD (gastroesophageal reflux disease)    OTC med prn  . Osteoporosis   . PONV (postoperative nausea and vomiting)   . Wears glasses    Past Surgical History:  Procedure Laterality Date  . ABDOMINAL HYSTERECTOMY    . I&D EXTREMITY Left 10/09/2017   Procedure: IRRIGATION AND DEBRIDEMENT LEFT FOOT;  Surgeon: Leandrew Koyanagi, MD;  Location: [place];  Service: Orthopedics;  Laterality: Left;  . ORIF FIBULA FRACTURE Left 08/21/2016   Procedure: OPEN REDUCTION INTERNAL FIXATION (ORIF) DISTAL FIBULA FRACTURE;  Surgeon: Ninetta Lights, MD;  Location: [place];  Service: Orthopedics;  Laterality: Left;  . ROTATOR CUFF REPAIR Right   . TONSILLECTOMY    . TUBAL LIGATION     Current Outpatient Medications on File Prior to Visit  Medication Sig Dispense Refill  . Apple Cider Vinegar 500 MG TABS Take by mouth.    . Cyanocobalamin  (B-12) 1000 MCG TABS Take by mouth.    . cyclobenzaprine (FLEXERIL) 10 MG tablet Take 1 tablet (10 mg total) by mouth 3 (three) times daily as needed for muscle spasms. 60 tablet 0  . diazepam (VALIUM) 10 MG tablet Take 1 tablet (10 mg total) by mouth every 8 (eight) hours as needed (muscle spasm). 30 tablet 0  . diclofenac (VOLTAREN) 75 MG EC tablet Take 1 tablet (75 mg total) by mouth 2 (two) times daily. Stop celebrex 60 tablet 2  . escitalopram (LEXAPRO) 20 MG tablet Take 1 tablet (20 mg total) by mouth daily. 90 tablet 3  . esomeprazole (NEXIUM) 40 MG capsule Take 1 capsule (40 mg total) by mouth daily. 30 capsule 6  . fexofenadine (ALLEGRA) 180 MG tablet Take 180 mg by mouth daily.    Marland Kitchen levocetirizine (XYZAL) 5 MG tablet TAKE 1 TABLET BY MOUTH EVERY DAY IN THE EVENING 90 tablet 3  . montelukast (SINGULAIR) 10 MG tablet Take 1 tablet (10 mg total) by mouth at bedtime. 30 tablet 3  . Multiple Vitamin (MULTIVITAMIN WITH MINERALS) TABS tablet Take 1 tablet by mouth daily.    . Multiple Vitamins-Minerals (OCUVITE PRESERVISION PO) Take by mouth.    . terbinafine (LAMISIL) 250 MG tablet Take 1 tablet (  250 mg total) by mouth daily. 30 tablet 2  . triamcinolone cream (KENALOG) 0.1 % Apply 1 application topically 2 (two) times daily. 30 g 0  . TURMERIC PO Take by mouth.     No current facility-administered medications on file prior to visit.    Allergies  Allergen Reactions  . Erythromycin Nausea And Vomiting and Other (See Comments)    GI UPSET   Social History   Socioeconomic History  . Marital status: Married    Spouse name: Not on file  . Number of children: Not on file  . Years of education: Not on file  . Highest education level: Not on file  Occupational History  . Not on file  Social Needs  . Financial resource strain: Not on file  . Food insecurity    Worry: Not on file    Inability: Not on file  . Transportation needs    Medical: Not on file    Non-medical: Not on file   Tobacco Use  . Smoking status: Never Smoker  . Smokeless tobacco: Never Used  Substance and Sexual Activity  . Alcohol use: No  . Drug use: No  . Sexual activity: Yes  Lifestyle  . Physical activity    Days per week: Not on file    Minutes per session: Not on file  . Stress: Not on file  Relationships  . Social Herbalist on phone: Not on file    Gets together: Not on file    Attends religious service: Not on file    Active member of club or organization: Not on file    Attends meetings of clubs or organizations: Not on file    Relationship status: Not on file  . Intimate partner violence    Fear of current or ex partner: Not on file    Emotionally abused: Not on file    Physically abused: Not on file    Forced sexual activity: Not on file  Other Topics Concern  . Not on file  Social History Narrative  . Not on file     Review of Systems  All other systems reviewed and are negative.      Objective:   Physical Exam Vitals signs reviewed.  Cardiovascular:     Rate and Rhythm: Normal rate and regular rhythm.  Pulmonary:     Effort: Pulmonary effort is normal.     Breath sounds: Normal breath sounds.  Musculoskeletal:     Right wrist: She exhibits tenderness, bony tenderness and crepitus. She exhibits no swelling.     Right hand: She exhibits tenderness. She exhibits no deformity. Normal sensation noted. Normal strength noted.       Hands:           Assessment & Plan:  Arthritis of carpometacarpal [facility]) joint of right thumb - Plan: Ambulatory referral to Hand Surgery  Recommended a referral to orthopedic surgery for possible cortisone injection in the right first [facility] joint.  Patient is already on diclofenac 75 mg twice daily.  Would not recommend additional NSAIDs orally.  We will try topical Voltaren gel 2 g 4 times daily applied to the affected area however I suspect that she would benefit most from a cortisone injection.  The purple circle  represents a benign ganglion cyst.  Treatment is necessary.

## 2019-03-01 NOTE — Telephone Encounter (Signed)
Received request from pharmacy for PA on Voltaren Gel.   PA submitted.   Dx: M18.11- arthritis of thumb.

## 2019-03-01 NOTE — Telephone Encounter (Signed)
Your information has been submitted to [employer]. To check for an updated outcome later, reopen this PA request from your dashboard. If [employer] has not responded to your request within 24 hours, contact [employer] at [phone]. If you think there may be a problem with your PA request, use our live chat feature at the bottom right.    

## 2019-03-02 MED ORDER — DICLOFENAC SODIUM 1 % TD GEL: 2.0000 g | Freq: Four times a day (QID) | TRANSDERMAL | 1 refills | Status: DC

## 2019-03-02 NOTE — Telephone Encounter (Signed)
PA approved 03/01/2019- 02/28/2022.

## 2019-03-03 DIAGNOSIS — M67441 Ganglion, right hand: Secondary | ICD-10-CM | POA: Insufficient documentation

## 2019-03-03 DIAGNOSIS — M654 Radial styloid tenosynovitis [de Quervain]: Secondary | ICD-10-CM | POA: Insufficient documentation

## 2019-03-18 ENCOUNTER — Other Ambulatory Visit: Payer: Self-pay | Admitting: Family Medicine

## 2019-06-03 ENCOUNTER — Ambulatory Visit (INDEPENDENT_AMBULATORY_CARE_PROVIDER_SITE_OTHER): Payer: BC Managed Care – PPO | Admitting: Family Medicine

## 2019-06-03 ENCOUNTER — Other Ambulatory Visit: Payer: Self-pay

## 2019-06-03 DIAGNOSIS — Z20822 Contact with and (suspected) exposure to covid-19: Secondary | ICD-10-CM | POA: Diagnosis not present

## 2019-06-03 NOTE — Progress Notes (Signed)
Subjective:    Patient ID: Madison Bryant, female    DOB: [birth year], 64 y.o.   MRN: [id number]  HPI Patient is a very pleasant 64 year old Caucasian female who is being seen today as a telephone visit.  She consents to be seen via telephone.  Phone call began at 1155.  Phone call concluded at 1203.  Patient's husband was diagnosed with Covid 05/23/19.  Patient's husband contracted the infection from their son who also had Covid.  Both the patient and her husband were around their son.  She has also been in close proximity to her husband despite taking precautions since his diagnosis.  She has now developed severe fatigue.  Patient states that she has had no energy all week long.  She is sleeping 10 or more hours and does not feel rested.  She denies any fever.  She denies any chills.  She denies any chest pain or shortness of breath.  She has not lost her sense of smell or taste.  However she is having profound fatigue.  Thankfully her husband is feeling better.  Patient would estimate that she has had the symptoms 3 days.  Past Medical History:  Diagnosis Date  . Allergy   . Ankle fracture    left  . Arthritis    fingers, knees and hips  . Asthma   . Cellulitis    left foot  . GERD (gastroesophageal reflux disease)    OTC med prn  . Osteoporosis   . PONV (postoperative nausea and vomiting)   . Wears glasses    Past Surgical History:  Procedure Laterality Date  . ABDOMINAL HYSTERECTOMY    . I & D EXTREMITY Left 10/09/2017   Procedure: IRRIGATION AND DEBRIDEMENT LEFT FOOT;  Surgeon: Leandrew Koyanagi, MD;  Location: [place];  Service: Orthopedics;  Laterality: Left;  . ORIF FIBULA FRACTURE Left 08/21/2016   Procedure: OPEN REDUCTION INTERNAL FIXATION (ORIF) DISTAL FIBULA FRACTURE;  Surgeon: Ninetta Lights, MD;  Location: [place];  Service: Orthopedics;  Laterality: Left;  . ROTATOR CUFF REPAIR Right   . TONSILLECTOMY    . TUBAL LIGATION     Current Outpatient  Medications on File Prior to Visit  Medication Sig Dispense Refill  . Apple Cider Vinegar 500 MG TABS Take by mouth.    . Cyanocobalamin (B-12) 1000 MCG TABS Take by mouth.    . cyclobenzaprine (FLEXERIL) 10 MG tablet Take 1 tablet (10 mg total) by mouth 3 (three) times daily as needed for muscle spasms. 60 tablet 0  . diazepam (VALIUM) 10 MG tablet Take 1 tablet (10 mg total) by mouth every 8 (eight) hours as needed (muscle spasm). 30 tablet 0  . diclofenac (VOLTAREN) 75 MG EC tablet TAKE 1 TABLET BY MOUTH 2 TIMES DAILY. STOP CELEBREX 60 tablet 2  . diclofenac sodium (VOLTAREN) 1 % GEL Apply 2 g topically 4 (four) times daily. 100 g 1  . escitalopram (LEXAPRO) 20 MG tablet Take 1 tablet (20 mg total) by mouth daily. 90 tablet 3  . esomeprazole (NEXIUM) 40 MG capsule Take 1 capsule (40 mg total) by mouth daily. 30 capsule 6  . fexofenadine (ALLEGRA) 180 MG tablet Take 180 mg by mouth daily.    Marland Kitchen levocetirizine (XYZAL) 5 MG tablet TAKE 1 TABLET BY MOUTH EVERY DAY IN THE EVENING 90 tablet 3  . montelukast (SINGULAIR) 10 MG tablet Take 1 tablet (10 mg total) by mouth at bedtime. 30 tablet 3  . Multiple  Vitamin (MULTIVITAMIN WITH MINERALS) TABS tablet Take 1 tablet by mouth daily.    . Multiple Vitamins-Minerals (OCUVITE PRESERVISION PO) Take by mouth.    . terbinafine (LAMISIL) 250 MG tablet Take 1 tablet (250 mg total) by mouth daily. 30 tablet 2  . triamcinolone cream (KENALOG) 0.1 % Apply 1 application topically 2 (two) times daily. 30 g 0  . TURMERIC PO Take by mouth.     No current facility-administered medications on file prior to visit.   Allergies  Allergen Reactions  . Erythromycin Nausea And Vomiting and Other (See Comments)    GI UPSET   Social History   Socioeconomic History  . Marital status: Married    Spouse name: Not on file  . Number of children: Not on file  . Years of education: Not on file  . Highest education level: Not on file  Occupational History  . Not on file   Tobacco Use  . Smoking status: Never Smoker  . Smokeless tobacco: Never Used  Substance and Sexual Activity  . Alcohol use: No  . Drug use: No  . Sexual activity: Yes  Other Topics Concern  . Not on file  Social History Narrative  . Not on file   Social Determinants of Health   Financial Resource Strain:   . Difficulty of Paying Living Expenses: Not on file  Food Insecurity:   . Worried About Charity fundraiser in the Last Year: Not on file  . Ran Out of Food in the Last Year: Not on file  Transportation Needs:   . Lack of Transportation (Medical): Not on file  . Lack of Transportation (Non-Medical): Not on file  Physical Activity:   . Days of Exercise per Week: Not on file  . Minutes of Exercise per Session: Not on file  Stress:   . Feeling of Stress : Not on file  Social Connections:   . Frequency of Communication with Friends and Family: Not on file  . Frequency of Social Gatherings with Friends and Family: Not on file  . Attends Religious Services: Not on file  . Active Member of Clubs or Organizations: Not on file  . Attends Archivist Meetings: Not on file  . Marital Status: Not on file  Intimate Partner Violence:   . Fear of Current or Ex-Partner: Not on file  . Emotionally Abused: Not on file  . Physically Abused: Not on file  . Sexually Abused: Not on file      Review of Systems  All other systems reviewed and are negative.      Objective:   Physical Exam   Patient demonstrates no shortness of breath while on the telephone.  She is speaking full and complete sentences with no increased work of breathing.     Assessment & Plan:  Close exposure to COVID-19 virus  I believe the patient most likely has COVID-19.  I have recommended that she come to the office immediately so that I can swab her today.  I recommended that she quarantine herself 14 days from symptom onset.  Recommended symptomatic treatment including Tylenol and/or ibuprofen  for body aches and fever.  She can take vitamin C, vitamin D, zinc to help mitigate the impact of the illness.  Seek medical attention immediately if she develops shortness of breath or chest pain.

## 2019-06-05 LAB — SARS-COV-2 RNA,(COVID-19) QUALITATIVE NAAT: SARS CoV2 RNA: NOT DETECTED

## 2019-06-09 ENCOUNTER — Other Ambulatory Visit: Payer: Self-pay

## 2019-06-09 ENCOUNTER — Ambulatory Visit (INDEPENDENT_AMBULATORY_CARE_PROVIDER_SITE_OTHER): Payer: BC Managed Care – PPO | Admitting: Family Medicine

## 2019-06-09 DIAGNOSIS — Z20822 Contact with and (suspected) exposure to covid-19: Secondary | ICD-10-CM | POA: Diagnosis not present

## 2019-06-09 DIAGNOSIS — R5383 Other fatigue: Secondary | ICD-10-CM | POA: Diagnosis not present

## 2019-06-09 NOTE — Progress Notes (Signed)
Subjective:    Patient ID: Madison Bryant, female    DOB: Apr 20, 1956, 64 y.o.   MRN: [id number]  HPI Patient is here today for follow-up.  Her husband was diagnosed on January 9 with Covid.  She has been living at home with him obviously the entire duration.  He has completely recovered.  She has developed severe fatigue.  She was tested for Covid last week and the test returned negative however her fatigue has not improved.  The patient is concerned that there is something else going on.  She is under tremendous stress.  The patient reports having to care for her sister who has lupus and dementia.  She is also having to deal with her financial issues as the patient sister has a difficult time managing her affairs.  She is also caring for her grandson.  All this is created tremendous stress.  The grandson is 7-year-old and she often will have him from 6 AM to 6 PM on a daily basis.  She reports that she will go to sleep at night around 10 or 11:00.  However this last week she will often sleep till 930 or 10 in the morning and still feel like she has no energy.  She reports feeling sleepy tired as well as physically tired.  She denies any cough although she coughed twice while we were talking.  She denies any rhinorrhea.  She denies any sore throat.  She denies any loss in her sense of smell or taste.  She denies any nausea vomiting or diarrhea.  She denies any abdominal pain.  She denies any melena or hematochezia.  She denies any dysuria.  She denies any rashes.  She denies any vaginal bleeding.  She denies any rapid weight change or heat or cold intolerance.  She denies any bruising, night sweats, or myalgias. Past Medical History:  Diagnosis Date  . Allergy   . Ankle fracture    left  . Arthritis    fingers, knees and hips  . Asthma   . Cellulitis    left foot  . GERD (gastroesophageal reflux disease)    OTC med prn  . Osteoporosis   . PONV (postoperative nausea and vomiting)   . Wears  glasses    Past Surgical History:  Procedure Laterality Date  . ABDOMINAL HYSTERECTOMY    . I & D EXTREMITY Left 10/09/2017   Procedure: IRRIGATION AND DEBRIDEMENT LEFT FOOT;  Surgeon: Leandrew Koyanagi, MD;  Location: [place];  Service: Orthopedics;  Laterality: Left;  . ORIF FIBULA FRACTURE Left 08/21/2016   Procedure: OPEN REDUCTION INTERNAL FIXATION (ORIF) DISTAL FIBULA FRACTURE;  Surgeon: Ninetta Lights, MD;  Location: [facility];  Service: Orthopedics;  Laterality: Left;  . ROTATOR CUFF REPAIR Right   . TONSILLECTOMY    . TUBAL LIGATION     Current Outpatient Medications on File Prior to Visit  Medication Sig Dispense Refill  . Apple Cider Vinegar 500 MG TABS Take by mouth.    . Cyanocobalamin (B-12) 1000 MCG TABS Take by mouth.    . cyclobenzaprine (FLEXERIL) 10 MG tablet Take 1 tablet (10 mg total) by mouth 3 (three) times daily as needed for muscle spasms. 60 tablet 0  . diazepam (VALIUM) 10 MG tablet Take 1 tablet (10 mg total) by mouth every 8 (eight) hours as needed (muscle spasm). 30 tablet 0  . diclofenac (VOLTAREN) 75 MG EC tablet TAKE 1 TABLET BY MOUTH 2 TIMES DAILY. STOP CELEBREX  60 tablet 2  . diclofenac sodium (VOLTAREN) 1 % GEL Apply 2 g topically 4 (four) times daily. 100 g 1  . escitalopram (LEXAPRO) 20 MG tablet Take 1 tablet (20 mg total) by mouth daily. 90 tablet 3  . esomeprazole (NEXIUM) 40 MG capsule Take 1 capsule (40 mg total) by mouth daily. 30 capsule 6  . fexofenadine (ALLEGRA) 180 MG tablet Take 180 mg by mouth daily.    Marland Kitchen levocetirizine (XYZAL) 5 MG tablet TAKE 1 TABLET BY MOUTH EVERY DAY IN THE EVENING 90 tablet 3  . montelukast (SINGULAIR) 10 MG tablet Take 1 tablet (10 mg total) by mouth at bedtime. 30 tablet 3  . Multiple Vitamin (MULTIVITAMIN WITH MINERALS) TABS tablet Take 1 tablet by mouth daily.    . Multiple Vitamins-Minerals (OCUVITE PRESERVISION PO) Take by mouth.    . terbinafine (LAMISIL) 250 MG tablet Take 1 tablet (250 mg total) by  mouth daily. 30 tablet 2  . triamcinolone cream (KENALOG) 0.1 % Apply 1 application topically 2 (two) times daily. 30 g 0  . TURMERIC PO Take by mouth.     No current facility-administered medications on file prior to visit.   Allergies  Allergen Reactions  . Erythromycin Nausea And Vomiting and Other (See Comments)    GI UPSET   Social History   Socioeconomic History  . Marital status: Married    Spouse name: Not on file  . Number of children: Not on file  . Years of education: Not on file  . Highest education level: Not on file  Occupational History  . Not on file  Tobacco Use  . Smoking status: Never Smoker  . Smokeless tobacco: Never Used  Substance and Sexual Activity  . Alcohol use: No  . Drug use: No  . Sexual activity: Yes  Other Topics Concern  . Not on file  Social History Narrative  . Not on file   Social Determinants of Health   Financial Resource Strain:   . Difficulty of Paying Living Expenses: Not on file  Food Insecurity:   . Worried About Charity fundraiser in the Last Year: Not on file  . Ran Out of Food in the Last Year: Not on file  Transportation Needs:   . Lack of Transportation (Medical): Not on file  . Lack of Transportation (Non-Medical): Not on file  Physical Activity:   . Days of Exercise per Week: Not on file  . Minutes of Exercise per Session: Not on file  Stress:   . Feeling of Stress : Not on file  Social Connections:   . Frequency of Communication with Friends and Family: Not on file  . Frequency of Social Gatherings with Friends and Family: Not on file  . Attends Religious Services: Not on file  . Active Member of Clubs or Organizations: Not on file  . Attends Archivist Meetings: Not on file  . Marital Status: Not on file  Intimate Partner Violence:   . Fear of Current or Ex-Partner: Not on file  . Emotionally Abused: Not on file  . Physically Abused: Not on file  . Sexually Abused: Not on file      Review of  Systems  All other systems reviewed and are negative.      Objective:   Physical Exam Constitutional:      General: She is not in acute distress.    Appearance: Normal appearance. She is not ill-appearing, toxic-appearing or diaphoretic.  HENT:  Head: Normocephalic and atraumatic.  Eyes:     Extraocular Movements: Extraocular movements intact.     Conjunctiva/sclera: Conjunctivae normal.     Pupils: Pupils are equal, round, and reactive to light.  Cardiovascular:     Rate and Rhythm: Normal rate and regular rhythm.     Heart sounds: Normal heart sounds.  Pulmonary:     Effort: Pulmonary effort is normal. No respiratory distress.     Breath sounds: Normal breath sounds. No stridor. No wheezing.  Musculoskeletal:     Cervical back: Neck supple.  Skin:    Coloration: Skin is not jaundiced.     Findings: No erythema.  Neurological:     General: No focal deficit present.     Mental Status: She is alert and oriented to person, place, and time. Mental status is at baseline.     Cranial Nerves: No cranial nerve deficit.     Motor: No weakness.     Coordination: Coordination normal.     Gait: Gait normal.  Psychiatric:        Mood and Affect: Mood normal.        Behavior: Behavior normal.        Thought Content: Thought content normal.        Judgment: Judgment normal.           Assessment & Plan:  Close exposure to COVID-19 virus - Plan: SARS-COV-2 RNA,(COVID-19) QUAL NAAT  Other fatigue - Plan: SARS-COV-2 RNA,(COVID-19) QUAL NAAT  Most likely I think the patient has COVID-19.  I believe her first test was a false negative.  This is the most likely explanation given her close exposure and the timeframe in which the fatigue developed.  Therefore I repeated the COVID-19 test today.  If Monday, the COVID-19 test is negative, I would recommend lab work to evaluate for other potential causes of fatigue including a CBC to evaluate for any bone marrow abnormalities such as  anemia or leukemia.  I will check a sedimentation rate to evaluate for any evidence of an autoimmune disease.  I will check a TSH to rule out hypothyroidism.  We can check a cortisol level to rule out adrenal insufficiency.  We can check a CMP to evaluate for any electrolyte abnormalities and also check a fecal occult blood test to evaluate for any GI pathology.Marland Kitchen However if the Covid test is negative and the lab work is negative, another explanation for her fatigue stress.  Patient admits this.  Sleep apnea is another possibility.

## 2019-06-11 LAB — SARS-COV-2 RNA,(COVID-19) QUALITATIVE NAAT: SARS CoV2 RNA: NOT DETECTED

## 2019-06-14 ENCOUNTER — Other Ambulatory Visit: Payer: BC Managed Care – PPO

## 2019-06-14 ENCOUNTER — Other Ambulatory Visit: Payer: Self-pay

## 2019-06-14 DIAGNOSIS — R5383 Other fatigue: Secondary | ICD-10-CM

## 2019-06-15 LAB — CBC WITH DIFFERENTIAL/PLATELET
Absolute Monocytes: 406 cells/uL (ref 200–950)
Basophils Absolute: 30 cells/uL (ref 0–200)
Basophils Relative: 0.9 %
Eosinophils Absolute: 132 cells/uL (ref 15–500)
Eosinophils Relative: 4 %
HCT: 35.1 % (ref 35.0–45.0)
Hemoglobin: 11.6 g/dL — ABNORMAL LOW (ref 11.7–15.5)
Lymphs Abs: 851 cells/uL (ref 850–3900)
MCH: 29.5 pg (ref 27.0–33.0)
MCHC: 33 g/dL (ref 32.0–36.0)
MCV: 89.3 fL (ref 80.0–100.0)
MPV: 9 fL (ref 7.5–12.5)
Monocytes Relative: 12.3 %
Neutro Abs: 1881 cells/uL (ref 1500–7800)
Neutrophils Relative %: 57 %
Platelets: 247 10*3/uL (ref 140–400)
RBC: 3.93 10*6/uL (ref 3.80–5.10)
RDW: 12.2 % (ref 11.0–15.0)
Total Lymphocyte: 25.8 %
WBC: 3.3 10*3/uL — ABNORMAL LOW (ref 3.8–10.8)

## 2019-06-15 LAB — TSH: TSH: 2.38 mIU/L (ref 0.40–4.50)

## 2019-06-15 LAB — SEDIMENTATION RATE: Sed Rate: 9 mm/h (ref 0–30)

## 2019-06-15 LAB — CORTISOL: Cortisol, Plasma: 12.5 ug/dL

## 2019-07-10 ENCOUNTER — Other Ambulatory Visit: Payer: Self-pay | Admitting: Family Medicine

## 2019-10-19 ENCOUNTER — Ambulatory Visit: Payer: Self-pay

## 2019-10-19 ENCOUNTER — Ambulatory Visit: Payer: BC Managed Care – PPO | Admitting: Orthopaedic Surgery

## 2019-10-19 ENCOUNTER — Encounter: Payer: Self-pay | Admitting: Orthopaedic Surgery

## 2019-10-19 ENCOUNTER — Other Ambulatory Visit: Payer: Self-pay

## 2019-10-19 VITALS — Ht 66.0 in | Wt 175.0 lb

## 2019-10-19 DIAGNOSIS — M2022 Hallux rigidus, left foot: Secondary | ICD-10-CM | POA: Diagnosis not present

## 2019-10-19 NOTE — Progress Notes (Signed)
Office Visit Note   Patient: Madison Bryant           Date of Birth: Jan 09, 1956           MRN: [id number] Visit Date: 10/19/2019              Requested by: Susy Frizzle, MD [address] [place],  [place] [address] PCP: Susy Frizzle, MD   Assessment & Plan: Visit Diagnoses:  1. Hallux rigidus of left foot     Plan: Impression is left hallux rigidus.  Based on discussion of treatment options she would like to try Voltaren gel as well as a carbon footplate.  She will also look for more rigid shoes to help with the pain.  We will see her back as needed.  Follow-Up Instructions: Return if symptoms worsen or fail to improve.   Orders:  Orders Placed This Encounter  Procedures  . XR Foot Complete Left   No orders of the defined types were placed in this encounter.     Procedures: No procedures performed   Clinical Data: No additional findings.   Subjective: Chief Complaint  Patient presents with  . Left Foot - Pain    Naleah is a 64 year old female who I took care of 2 years ago for an infection of the left foot due to a cat bite.  She did well from this.  For the last 6 months she has had worsening pain in her great toe that is worse with ambulation and walking.  Sometimes the pain radiates to the lesser toes and she feels like the big toe grinds.  She takes Tylenol without significant relief.  Denies any numbness and tingling.   Review of Systems  Constitutional: Negative.   HENT: Negative.   Eyes: Negative.   Respiratory: Negative.   Cardiovascular: Negative.   Endocrine: Negative.   Musculoskeletal: Negative.   Neurological: Negative.   Hematological: Negative.   Psychiatric/Behavioral: Negative.   All other systems reviewed and are negative.    Objective: Vital Signs: Ht 5\' 6"  (1.676 m)   Wt 175 lb (79.4 kg)   BMI 28.25 kg/m   Physical Exam Vitals and nursing note reviewed.  Constitutional:      Appearance: She is  well-developed.  Pulmonary:     Effort: Pulmonary effort is normal.  Skin:    General: Skin is warm.     Capillary Refill: Capillary refill takes less than 2 seconds.  Neurological:     Mental Status: She is alert and oriented to person, place, and time.  Psychiatric:        Behavior: Behavior normal.        Thought Content: Thought content normal.        Judgment: Judgment normal.     Ortho Exam Left great toe shows a positive grind test.  Mild discomfort with dorsiflexion and plantarflexion of the MTP joint.  Small bunion deformity. Specialty Comments:  No specialty comments available.  Imaging: XR Foot Complete Left  Result Date: 10/19/2019 Moderate degenerative changes of the great toe MTP joint consistent with hallux rigidus    PMFS History: Patient Active Problem List   Diagnosis Date Noted  . Hallux rigidus of left foot 10/19/2019  . GERD (gastroesophageal reflux disease) 01/26/2019  . Constipation 01/26/2019  . MDD (major depressive disorder) 11/12/2018  . Osteoarthritis 02/08/2018  . 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 Allergy 02/08/2018  . Onychomycosis 02/08/2018  . Cat bite   .  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 Tenosynovitis of left foot   . Foot abscess, left 10/09/2017  . Eczema 07/24/2014   Past Medical History:  Diagnosis Date  . Allergy   . Ankle fracture    left  . Arthritis    fingers, knees and hips  . Asthma   . Cellulitis    left foot  . GERD (gastroesophageal reflux disease)    OTC med prn  . Osteoporosis   . PONV (postoperative nausea and vomiting)   . Wears glasses     Family History  Problem Relation Age of Onset  . Miscarriages / [place] Mother     Past Surgical History:  Procedure Laterality Date  . ABDOMINAL HYSTERECTOMY    . I & D EXTREMITY Left 10/09/2017   Procedure: IRRIGATION AND DEBRIDEMENT LEFT FOOT;  Surgeon: Leandrew Koyanagi, MD;  Location: [facility];  Service: Orthopedics;  Laterality: Left;  .  ORIF FIBULA FRACTURE Left 08/21/2016   Procedure: OPEN REDUCTION INTERNAL FIXATION (ORIF) DISTAL FIBULA FRACTURE;  Surgeon: Ninetta Lights, MD;  Location: [facility];  Service: Orthopedics;  Laterality: Left;  . ROTATOR CUFF REPAIR Right   . TONSILLECTOMY    . TUBAL LIGATION     Social History   Occupational History  . Not on file  Tobacco Use  . Smoking status: Never Smoker  . Smokeless tobacco: Never Used  Substance and Sexual Activity  . Alcohol use: No  . Drug use: No  . Sexual activity: Yes

## 2019-11-14 ENCOUNTER — Other Ambulatory Visit: Payer: Self-pay | Admitting: Family Medicine

## 2020-01-12 ENCOUNTER — Other Ambulatory Visit: Payer: Self-pay | Admitting: Family Medicine

## 2020-01-17 ENCOUNTER — Ambulatory Visit: Payer: BC Managed Care – PPO | Admitting: Orthopaedic Surgery

## 2020-01-17 ENCOUNTER — Ambulatory Visit: Payer: Self-pay

## 2020-01-17 ENCOUNTER — Encounter: Payer: Self-pay | Admitting: Orthopaedic Surgery

## 2020-01-17 DIAGNOSIS — M25511 Pain in right shoulder: Secondary | ICD-10-CM | POA: Diagnosis not present

## 2020-01-17 DIAGNOSIS — G8929 Other chronic pain: Secondary | ICD-10-CM | POA: Diagnosis not present

## 2020-01-17 MED ORDER — METHYLPREDNISOLONE ACETATE 40 MG/ML IJ SUSP
40.0000 mg | INTRAMUSCULAR | Status: AC | PRN
  Administered 2020-01-17: 40 mg via INTRA_ARTICULAR

## 2020-01-17 MED ORDER — BUPIVACAINE HCL 0.5 % IJ SOLN
3.0000 mL | INTRAMUSCULAR | Status: AC | PRN
  Administered 2020-01-17: 3 mL via INTRA_ARTICULAR

## 2020-01-17 MED ORDER — LIDOCAINE HCL 1 % IJ SOLN
3.0000 mL | INTRAMUSCULAR | Status: AC | PRN
  Administered 2020-01-17: 3 mL

## 2020-01-17 NOTE — Progress Notes (Signed)
Subjective: She is here for ultrasound-guided right biceps tendon injection.  Objective: She is point tender over the long head biceps tendon.  Procedure: Ultrasound-guided injection: After sterile prep with Betadine, injected 5 cc 1% lidocaine without epinephrine and 40 mg methylprednisolone into the tendon sheath.  She had good relief during the anesthetic phase.

## 2020-01-17 NOTE — Progress Notes (Signed)
Office Visit Note   Patient: Madison Bryant           Date of Birth: [birth year]           MRN: [id number] Visit Date: 01/17/2020              Requested by: Susy Frizzle, MD [address] [place],  [place] [address] PCP: Susy Frizzle, MD   Assessment & Plan: Visit Diagnoses:  1. Chronic right shoulder pain     Plan: Impression is chronic right shoulder pain due to subacromial bursitis and biceps tenosynovitis.  Based on our discussion of options we will go ahead and do a subacromial injection as well as an ultrasound-guided biceps tendon sheath injection with Dr. Junius Roads.  We will also provide her with home exercises.  Patient will follow-up if she does not notice any improvement.  Follow-Up Instructions: Return if symptoms worsen or fail to improve.   Orders:  Orders Placed This Encounter  Procedures  . XR Shoulder Right   No orders of the defined types were placed in this encounter.     Procedures: Large Joint Inj: R subacromial bursa on 01/17/2020 9:35 AM Indications: pain Details: 22 G needle  Arthrogram: No  Medications: 3 mL lidocaine 1 %; 3 mL bupivacaine 0.5 %; 40 mg methylPREDNISolone acetate 40 MG/ML Outcome: tolerated well, no immediate complications Consent was given by the patient. Patient was prepped and draped in the usual sterile fashion.       Clinical Data: No additional findings.   Subjective: Chief Complaint  Patient presents with  . Right Shoulder - Pain    Madison Bryant is a 64 year old female who I took care of a few years back for an infected right foot after a cat bite.  She went on to do well from this.  She comes in for an evaluation of a chronic issue regarding her right shoulder pain.  She had rotator cuff surgery about 10 years ago.  She denies any recent trauma or injuries.  She has been having a chronic throbbing pain and the lateral side of her shoulder that radiates down to the mid arm that is worse at night and with  activity.  Denies any radicular symptoms.  She also has pain anteriorly in the shoulder region.   Review of Systems  Constitutional: Negative.   HENT: Negative.   Eyes: Negative.   Respiratory: Negative.   Cardiovascular: Negative.   Endocrine: Negative.   Musculoskeletal: Negative.   Neurological: Negative.   Hematological: Negative.   Psychiatric/Behavioral: Negative.   All other systems reviewed and are negative.    Objective: Vital Signs: There were no vitals taken for this visit.  Physical Exam Vitals and nursing note reviewed.  Constitutional:      Appearance: She is well-developed.  Pulmonary:     Effort: Pulmonary effort is normal.  Skin:    General: Skin is warm.     Capillary Refill: Capillary refill takes less than 2 seconds.  Neurological:     Mental Status: She is alert and oriented to person, place, and time.  Psychiatric:        Behavior: Behavior normal.        Thought Content: Thought content normal.        Judgment: Judgment normal.     Ortho Exam Right shoulder shows good range of motion without pain.  Moderate pain with Hawkins sign and mild pain with cross body adduction.  Rotator cuff is  normal to manual muscle testing.  Biceps tendon is quite tender.  Negative Speed. Specialty Comments:  No specialty comments available.  Imaging: XR Shoulder Right  Result Date: 01/17/2020 Mild glenohumeral arthritis.  Status post distal clavicle excision.  Calcifications near the greater tuberosity likely secondary to previous rotator cuff repair.  No acute abnormalities.    PMFS History: Patient Active Problem List   Diagnosis Date Noted  . Hallux rigidus of left foot 10/19/2019  . GERD (gastroesophageal reflux disease) 01/26/2019  . Constipation 01/26/2019  . MDD (major depressive disorder) 11/12/2018  . Osteoarthritis 02/08/2018  . 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 Allergy 02/08/2018  . Onychomycosis 02/08/2018  . Cat bite   .  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 Tenosynovitis of left foot   . Foot abscess, left 10/09/2017  . Eczema 07/24/2014   Past Medical History:  Diagnosis Date  . Allergy   . Ankle fracture    left  . Arthritis    fingers, knees and hips  . Asthma   . Cellulitis    left foot  . GERD (gastroesophageal reflux disease)    OTC med prn  . Osteoporosis   . PONV (postoperative nausea and vomiting)   . Wears glasses     Family History  Problem Relation Age of Onset  . Miscarriages / [place] Mother     Past Surgical History:  Procedure Laterality Date  . ABDOMINAL HYSTERECTOMY    . I & D EXTREMITY Left 10/09/2017   Procedure: IRRIGATION AND DEBRIDEMENT LEFT FOOT;  Surgeon: Leandrew Koyanagi, MD;  Location: [facility];  Service: Orthopedics;  Laterality: Left;  . ORIF FIBULA FRACTURE Left 08/21/2016   Procedure: OPEN REDUCTION INTERNAL FIXATION (ORIF) DISTAL FIBULA FRACTURE;  Surgeon: Ninetta Lights, MD;  Location: [facility];  Service: Orthopedics;  Laterality: Left;  . ROTATOR CUFF REPAIR Right   . TONSILLECTOMY    . TUBAL LIGATION     Social History   Occupational History  . Not on file  Tobacco Use  . Smoking status: Never Smoker  . Smokeless tobacco: Never Used  Vaping Use  . Vaping Use: Never used  Substance and Sexual Activity  . Alcohol use: No  . Drug use: No  . Sexual activity: Yes

## 2020-03-13 ENCOUNTER — Encounter: Payer: Self-pay | Admitting: Family Medicine

## 2020-04-20 ENCOUNTER — Ambulatory Visit (INDEPENDENT_AMBULATORY_CARE_PROVIDER_SITE_OTHER): Payer: BC Managed Care – PPO | Admitting: Family Medicine

## 2020-04-20 ENCOUNTER — Encounter: Payer: Self-pay | Admitting: Family Medicine

## 2020-04-20 ENCOUNTER — Other Ambulatory Visit: Payer: Self-pay

## 2020-04-20 VITALS — BP 122/64 | HR 82 | Temp 98.3°F | Resp 14 | Ht 66.0 in | Wt 187.0 lb

## 2020-04-20 DIAGNOSIS — M545 Low back pain, unspecified: Secondary | ICD-10-CM | POA: Diagnosis not present

## 2020-04-20 DIAGNOSIS — J3489 Other specified disorders of nose and nasal sinuses: Secondary | ICD-10-CM

## 2020-04-20 DIAGNOSIS — G8929 Other chronic pain: Secondary | ICD-10-CM

## 2020-04-20 MED ORDER — BACTROBAN NASAL 2 % NA OINT: TOPICAL_OINTMENT | NASAL | 0 refills | Status: DC

## 2020-04-20 MED ORDER — METHYLPREDNISOLONE 4 MG PO TBPK: ORAL_TABLET | ORAL | 0 refills | Status: DC

## 2020-04-20 MED ORDER — DICLOFENAC SODIUM 75 MG PO TBEC: 75.0000 mg | DELAYED_RELEASE_TABLET | Freq: Two times a day (BID) | ORAL | 1 refills | Status: DC

## 2020-04-20 NOTE — Progress Notes (Signed)
   Subjective:    Patient ID: Madison Bryant, female    DOB: Sep 12, 1955, 64 y.o.   MRN: [id number]  Patient presents for Back Pain (Lower back pain radiating down B LE- restless legs at night) and Nasal SOre (Noted sore in L nare ) Patient here with low back pain for the past few months.  The pain goes into her thighs.  Sometimes her legs are restless at night but she denies any numbness or tingling in the lower extremities.  She has difficulty carrying laundry baskets and backing cleaner epispadias because of her back.  Is been no change in bowel or bladder.  She will take over-the-counter medications to help some with the pain she has been on diclofenac well as her Flexeril when she needs it.  She also complains of a nasal sore that has been present for the past week.  She denies any sinus pressure or drainage.  There was some bleeding when she was cleaning her nose.  She has been using Neosporin but it does not seem to heal.     Review Of Systems:  GEN- denies fatigue, fever, weight loss,weakness, recent illness HEENT- denies eye drainage, change in vision, nasal discharge, CVS- denies chest pain, palpitations RESP- denies SOB, cough, wheeze ABD- denies N/V, change in stools, abd pain GU- denies dysuria, hematuria, dribbling, incontinence MSK- + joint pain, muscle aches, injury Neuro- denies headache, dizziness, syncope, seizure activity       Objective:    BP 122/64   Pulse 82   Temp 98.3 F (36.8 C) (Temporal)   Resp 14   Ht 5\' 6"  (1.676 m)   Wt 187 lb (84.8 kg)   SpO2 98%   BMI 30.18 kg/m  GEN- NAD, alert and oriented x3 HEENT- PERRL, EOMI, non injected sclera, pink conjunctiva, MMM, oropharynx clear, TM clear no effusion, TM injected bilat  Neck- Supple, no LAD CVS- RRR, no murmur RESP-CTAB ABD-NABS,soft,NT,ND MSK TTP lower lumbar spine, Neg SLR, fair ROM spine, hips,+crepitus knees, no effusion in knees NEURO- normal tone LE, strength in tact Bilat LE, sensation in  tact  EXT- No edema Pulses- Radial, DP- 2+        Assessment & Plan:      Problem List Items Addressed This Visit   None   Visit Diagnoses    Chronic bilateral low back pain without sciatica    -  Primary   chroic back pain, hold diclofenac, given medrol dosepak, continue flexeril, politely declines narotic med, obtain xay Suspect she may have some DDD in spine, has history of back problems, back years ago  Known OA in other joints    Relevant Medications   diclofenac (VOLTAREN) 75 MG EC tablet   methylPREDNISolone (MEDROL DOSEPAK) 4 MG TBPK tablet   Other Relevant Orders   DG Lumbar Spine Complete   Nasal sore       bactroban to nares      Note: This dictation was prepared with Dragon dictation along with smaller phrase technology. Any transcriptional errors that result from this process are unintentional.

## 2020-04-20 NOTE — Patient Instructions (Signed)
Xray of lumbar spine- [facility]   [place]   Take steroids Use topical nasal ointment F/U pending results

## 2020-04-23 ENCOUNTER — Other Ambulatory Visit: Payer: Self-pay

## 2020-04-23 ENCOUNTER — Ambulatory Visit
Admission: RE | Admit: 2020-04-23 | Discharge: 2020-04-23 | Disposition: A | Discharge status: Home / Self Care | Payer: BC Managed Care – PPO | Source: Ambulatory Visit | Attending: Family Medicine | Admitting: Family Medicine

## 2020-04-23 DIAGNOSIS — G8929 Other chronic pain: Secondary | ICD-10-CM

## 2020-05-02 ENCOUNTER — Telehealth: Payer: Self-pay

## 2020-05-02 ENCOUNTER — Telehealth: Payer: Self-pay | Admitting: Family Medicine

## 2020-05-02 NOTE — Telephone Encounter (Signed)
Calling for x-ray results.

## 2020-05-02 NOTE — Telephone Encounter (Signed)
Left patient vm call office back for xray results

## 2020-05-02 NOTE — Telephone Encounter (Signed)
Called patient back left vm call office for xray results

## 2020-05-07 ENCOUNTER — Telehealth: Payer: Self-pay | Admitting: Family Medicine

## 2020-05-07 NOTE — Telephone Encounter (Signed)
Left patient vm call office back for lab results

## 2020-05-07 NOTE — Telephone Encounter (Signed)
Patient left vm asking for results of xray.  CB# [phone]

## 2020-05-08 ENCOUNTER — Other Ambulatory Visit: Payer: Self-pay | Admitting: *Deleted

## 2020-05-08 DIAGNOSIS — M5137 Other intervertebral disc degeneration, lumbosacral region: Secondary | ICD-10-CM

## 2020-05-08 DIAGNOSIS — M1990 Unspecified osteoarthritis, unspecified site: Secondary | ICD-10-CM

## 2020-05-21 ENCOUNTER — Other Ambulatory Visit: Payer: Self-pay

## 2020-05-21 ENCOUNTER — Telehealth (INDEPENDENT_AMBULATORY_CARE_PROVIDER_SITE_OTHER): Payer: BC Managed Care – PPO | Admitting: Nurse Practitioner

## 2020-05-21 DIAGNOSIS — Z008 Encounter for other general examination: Secondary | ICD-10-CM

## 2020-05-21 NOTE — Progress Notes (Signed)
Virtual visit scheduled; unable to connect with patient to start visit.  Call went straight to voice mail multiple attempts.

## 2020-05-29 ENCOUNTER — Ambulatory Visit: Payer: BC Managed Care – PPO | Attending: Family Medicine

## 2020-05-29 ENCOUNTER — Other Ambulatory Visit: Payer: Self-pay

## 2020-05-29 DIAGNOSIS — G8929 Other chronic pain: Secondary | ICD-10-CM | POA: Diagnosis present

## 2020-05-29 DIAGNOSIS — M6281 Muscle weakness (generalized): Secondary | ICD-10-CM | POA: Diagnosis present

## 2020-05-29 DIAGNOSIS — M545 Low back pain, unspecified: Secondary | ICD-10-CM | POA: Insufficient documentation

## 2020-05-29 NOTE — Therapy (Signed)
[place], [place], [address] Phone: [phone]   Fax:  [phone]  Physical Therapy Evaluation  Patient Details  Name: Madison Bryant MRN: [id number] Date of Birth: 1955/05/18 Referring Provider (PT): Buelah Manis, Modena Nunnery, MD   Encounter Date: 05/29/2020   PT End of Session - 05/29/20 1502    Visit Number 1    Number of Visits 17    Date for PT Re-Evaluation 07/28/20    PT Start Time 1450    PT Stop Time 1535    PT Time Calculation (min) 45 min    Activity Tolerance Patient tolerated treatment well    Behavior During Therapy [facility] for tasks assessed/performed           Past Medical History:  Diagnosis Date  . Allergy   . Ankle fracture    left  . Arthritis    fingers, knees and hips  . Asthma   . Cellulitis    left foot  . GERD (gastroesophageal reflux disease)    OTC med prn  . Osteoporosis   . PONV (postoperative nausea and vomiting)   . Wears glasses     Past Surgical History:  Procedure Laterality Date  . ABDOMINAL HYSTERECTOMY    . I & D EXTREMITY Left 10/09/2017   Procedure: IRRIGATION AND DEBRIDEMENT LEFT FOOT;  Surgeon: Leandrew Koyanagi, MD;  Location: [place];  Service: Orthopedics;  Laterality: Left;  . ORIF FIBULA FRACTURE Left 08/21/2016   Procedure: OPEN REDUCTION INTERNAL FIXATION (ORIF) DISTAL FIBULA FRACTURE;  Surgeon: Ninetta Lights, MD;  Location: [place];  Service: Orthopedics;  Laterality: Left;  . ROTATOR CUFF REPAIR Right   . TONSILLECTOMY    . TUBAL LIGATION      There were no vitals filed for this visit.    Subjective Assessment - 05/29/20 1450    Subjective "Basically my back has been bothering me for quite awhile." She reports pain in low back and Rt lateral hip pain for a few years without known cause. She describes pain as sharp and aching at worst 8/10 with standing and walking and sleeping on Rt side. She reports sitting/leaning forward  helps to reduce her pain. She was prescribed prednisone, which did help while she was taking it, but has finished this medication. Patient denies any changes in bowel/bladder function and denies any LE numbness/tingling. She has noticed some spontaneous movement in her feet when she has been sitting for awhile and also has a history of restless leg syndrome.    Pertinent History osteoporosis, major depressive disorder, ORIF Lt distal fibula fracture 2018    Limitations Standing;Walking;Other (comment)   sleeping   How long can you sit comfortably? sitting doesn't bother me    How long can you stand comfortably? "a good 30-45 minutes then I need to lean over"    How long can you walk comfortably? about a mile, 20 minutes    Diagnostic tests X-ray:    IMPRESSION:  Mild degenerative changes.    Patient Stated Goals "motivated/able to move and walk more and find exercises I can do to help with weight gain" (would love to walk 3-4 miles at least 4 times per week)    Currently in Pain? No/denies              [facility] PT Assessment - 05/29/20 0001      Assessment   Medical Diagnosis DDD (degenerative disc disease), lumbosacral, Arthritis  Referring Provider (PT) Buelah Manis, Modena Nunnery, MD    Onset Date/Surgical Date --   for years   Hand Dominance Right    Next MD Visit none    Prior Therapy no      Precautions   Precautions None      Restrictions   Weight Bearing Restrictions No      Balance Screen   Has the patient fallen in the past 6 months No      [place] residence    Living Arrangements Spouse/significant other    Additional Comments stairs to enter and stairs inside      Prior Function   Level of Independence Independent    Vocation Retired    Leisure reading      Cognition   Overall Cognitive Status Within Functional Limits for tasks assessed      Observation/Other Assessments   Observations forward head,rounded shoulders    Focus on  Therapeutic Outcomes (FOTO)  53% function      Sensation   Light Touch Appears Intact      Coordination   Gross Motor Movements are Fluid and Coordinated Yes      AROM   Lumbar Flexion fingertips 6 inch from floor    Lumbar Extension WFL, end range pain    Lumbar - Right Side Bend fingertips 6 inch from lateral joint    Lumbar - Left Side Bend fingertip 5 inch from lateral joint line    Lumbar - Right Rotation WFL    Lumbar - Left Rotation [facility]      Strength   Right Hip Flexion 4+/5    Right Hip Extension 4/5 pain   Right Hip ABduction 3+/5  pain   Left Hip Flexion 4+/5    Left Hip Extension 4/5    Left Hip ABduction 4/5    Right Knee Flexion 5/5    Right Knee Extension 5/5    Left Knee Flexion 5/5    Left Knee Extension 5/5    Right Ankle Dorsiflexion 5/5    Right Ankle Plantar Flexion 5/5   sitting   Left Ankle Dorsiflexion 5/5    Left Ankle Plantar Flexion 5/5   sitting     Flexibility   Hamstrings 50% limitation bilaterally    Quadriceps 50% limitaiton bilaterally      Palpation   Palpation comment Tautness and palpable tendness Rt gluteal musculature, piriformis      Special Tests   Other special tests (-) SLR bilaterally (-) Sacrl Thrust (-) FABER      Ambulation/Gait   Gait Comments lateral trunk flexion during stance phase bilaterally                      Objective measurements completed on examination: See above findings.       [facility] Adult PT Treatment/Exercise - 05/29/20 0001      Self-Care   Other Self-Care Comments  see patient education                  PT Education - 05/29/20 1535    Education Details Education on current condition, POC, and HEP and TPDN. Education on FOTO results and anticipated progress. Review of imaging findings.    Person(s) Educated Patient    Methods Explanation;Demonstration;Handout    Comprehension Verbalized understanding;Returned demonstration            PT Short Term Goals - 05/29/20  1536  PT SHORT TERM GOAL #1   Title Patient will be independent and compliant with initial HEP.    Baseline issued at eval    Time 3    Period Weeks    Status New    Target Date 06/19/20      PT SHORT TERM GOAL #2   Title Patient will self-report tolerating 30 minutes of continuous standing activity at home with less than 3/10 low back pain.    Baseline Patient reports 8/10 back pain within 30 minutes of standing to wash dishes.    Time 4    Period Weeks    Status New    Target Date 06/26/20             PT Long Term Goals - 05/29/20 1538      PT LONG TERM GOAL #1   Title Patient will score at least 66% function on FOTO to signify clinically meaningful improvement in functional abilities.    Baseline 53% function    Time 8    Period Weeks    Status New    Target Date 07/24/20      PT LONG TERM GOAL #2   Title Patient will demonstrate at least 4+/5 strength in bilateral hips to improve stability about the chain necessary for prolonged walking activity.    Baseline see flowsheet    Time 8    Period Weeks    Status New    Target Date 07/24/20      PT LONG TERM GOAL #3   Title Patient will self report ability to ascend flight of stairs in her home without difficulty.    Baseline Patient reports a lot of difficulty with ascending stairs at home.    Time 8    Period Weeks    Status New    Target Date 07/24/20      PT LONG TERM GOAL #4   Title Patient will self-report ability to walk for at least 2 miles with less than 2/10 low back pain.    Baseline 8/10 pain during and after walking 1 mile    Time 8    Period Weeks    Status New    Target Date 07/24/20                  Plan - 05/29/20 1550    Clinical Impression Statement Patient is a 65 y/o female with chief complaint of chronic low back and Rt posterolateral hip pain of insidious onset. She currently presents with decreased trunk mobility most notable into lateral flexion bilaterally, bilateral hip  weakness (Rt>Lt) most remarkable in Rt hip abduction, decreased flexibility in hip/knee musculature, and activity limitations secondary to pain. She has notable tautness and palpable tenderness about Rt piriformis and gluteal musculature and is potentially interested in receiving TPDN at future sessions. She will benefit from skilled PT to address above stated deficits in order to return to optimal function.    Personal Factors and Comorbidities Time since onset of injury/illness/exacerbation;Comorbidity 3+    Comorbidities see subjective    Examination-Activity Limitations Bend;Carry;Lift;Sleep;Stairs;Stand;Locomotion Level    Examination-Participation Restrictions Cleaning;Meal Prep   playing with grandchildren   Clinical Decision Making Low    Rehab Potential Good    PT Frequency 2x / week    PT Duration 8 weeks    PT Treatment/Interventions ADLs/Self Care Home Management;Cryotherapy;Dentist;Therapeutic activities;Therapeutic exercise;Balance training;Neuromuscular re-education;Patient/family education;Manual techniques;Dry needling;Taping    PT Next Visit Plan review HEP and updated  as needed. Manual therapy/TPDN to Rt gluteals/piriformis, core stabilization, hip strengthening    PT Home Exercise Plan Access Code: [email or web address]    Consulted and Agree with Plan of Care Patient           Patient will benefit from skilled therapeutic intervention in order to improve the following deficits and impairments:  Decreased range of motion,Difficulty walking,Decreased endurance,Decreased activity tolerance,Pain,Impaired flexibility,Decreased strength,Postural dysfunction  Visit Diagnosis: Chronic bilateral low back pain, unspecified whether sciatica present  Muscle weakness (generalized)     Problem List Patient Active Problem List   Diagnosis Date Noted  . Hallux rigidus of left foot 10/19/2019  . GERD (gastroesophageal reflux disease)  01/26/2019  . Constipation 01/26/2019  . MDD (major depressive disorder) 11/12/2018  . Osteoarthritis 02/08/2018  . 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 Allergy 02/08/2018  . Onychomycosis 02/08/2018  . Cat bite   . 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 Tenosynovitis of left foot   . Foot abscess, left 10/09/2017  . Eczema 07/24/2014   Gwendolyn Grant, PT, DPT, ATC 05/29/20 4:17 PM  [facility] [address] [place], [place], [address] Phone: [phone]   Fax:  [phone]  Name: Madison Bryant MRN: [id number] Date of Birth: 1955/05/25

## 2020-06-05 ENCOUNTER — Other Ambulatory Visit: Payer: Self-pay

## 2020-06-05 ENCOUNTER — Ambulatory Visit: Payer: BC Managed Care – PPO

## 2020-06-05 DIAGNOSIS — G8929 Other chronic pain: Secondary | ICD-10-CM

## 2020-06-05 DIAGNOSIS — M545 Low back pain, unspecified: Secondary | ICD-10-CM | POA: Diagnosis not present

## 2020-06-05 DIAGNOSIS — M6281 Muscle weakness (generalized): Secondary | ICD-10-CM

## 2020-06-05 NOTE — Therapy (Signed)
[place], [place], [address] Phone: [phone]   Fax:  [phone]  Physical Therapy Treatment  Patient Details  Name: Madison Bryant MRN: [id number] Date of Birth: 1955/08/28 Referring Provider (PT): Buelah Manis, Modena Nunnery, MD   Encounter Date: 06/05/2020   PT End of Session - 06/05/20 1233    Visit Number 2    Number of Visits 17    Date for PT Re-Evaluation 07/28/20    PT Start Time [id number]    PT Stop Time 1315    PT Time Calculation (min) 42 min    Activity Tolerance Patient tolerated treatment well    Behavior During Therapy [facility] for tasks assessed/performed           Past Medical History:  Diagnosis Date  . Allergy   . Ankle fracture    left  . Arthritis    fingers, knees and hips  . Asthma   . Cellulitis    left foot  . GERD (gastroesophageal reflux disease)    OTC med prn  . Osteoporosis   . PONV (postoperative nausea and vomiting)   . Wears glasses     Past Surgical History:  Procedure Laterality Date  . ABDOMINAL HYSTERECTOMY    . I & D EXTREMITY Left 10/09/2017   Procedure: IRRIGATION AND DEBRIDEMENT LEFT FOOT;  Surgeon: Leandrew Koyanagi, MD;  Location: [place];  Service: Orthopedics;  Laterality: Left;  . ORIF FIBULA FRACTURE Left 08/21/2016   Procedure: OPEN REDUCTION INTERNAL FIXATION (ORIF) DISTAL FIBULA FRACTURE;  Surgeon: Ninetta Lights, MD;  Location: [place];  Service: Orthopedics;  Laterality: Left;  . ROTATOR CUFF REPAIR Right   . TONSILLECTOMY    . TUBAL LIGATION      There were no vitals filed for this visit.   Subjective Assessment - 06/05/20 1236    Subjective Patient reports the back has been feeling "ok" since initial evaluation. She reports compliance with HEP. She denies any pain currently, but had pain yesterday when vacuuming.    Currently in Pain? No/denies                             [facility] Adult PT Treatment/Exercise -  06/05/20 0001      Self-Care   Other Self-Care Comments  see patient education      Lumbar Exercises: Stretches   Passive Hamstring Stretch Limitations 60 sec each    Quad Stretch Limitations 90 sec each    Figure 4 Stretch Limitations seated 1 min each    Other Lumbar Stretch Exercise LTR 2 min      Lumbar Exercises: Supine   Pelvic Tilt Limitations 2 x 10    Bridge Limitations 2 x 10      Lumbar Exercises: Sidelying   Clam Limitations 2 x 10      Manual Therapy   Manual therapy comments STM/DTM to bilateral lumbar paraspinals and gluteal musculature                  PT Education - 06/05/20 1302    Education Details TPDN indications, expecations, and side effects. HEP updated to include seated figure 4 stretch as patient has ankle pain when performing in supine.    Person(s) Educated Patient    Methods Explanation;Handout    Comprehension Verbalized understanding;Returned demonstration            PT Short Term Goals -  05/29/20 1536      PT SHORT TERM GOAL #1   Title Patient will be independent and compliant with initial HEP.    Baseline issued at eval    Time 3    Period Weeks    Status New    Target Date 06/19/20      PT SHORT TERM GOAL #2   Title Patient will self-report tolerating 30 minutes of continuous standing activity at home with less than 3/10 low back pain.    Baseline Patient reports 8/10 back pain within 30 minutes of standing to wash dishes.    Time 4    Period Weeks    Status New    Target Date 06/26/20             PT Long Term Goals - 05/29/20 1538      PT LONG TERM GOAL #1   Title Patient will score at least 66% function on FOTO to signify clinically meaningful improvement in functional abilities.    Baseline 53% function    Time 8    Period Weeks    Status New    Target Date 07/24/20      PT LONG TERM GOAL #2   Title Patient will demonstrate at least 4+/5 strength in bilateral hips to improve stability about the chain  necessary for prolonged walking activity.    Baseline see flowsheet    Time 8    Period Weeks    Status New    Target Date 07/24/20      PT LONG TERM GOAL #3   Title Patient will self report ability to ascend flight of stairs in her home without difficulty.    Baseline Patient reports a lot of difficulty with ascending stairs at home.    Time 8    Period Weeks    Status New    Target Date 07/24/20      PT LONG TERM GOAL #4   Title Patient will self-report ability to walk for at least 2 miles with less than 2/10 low back pain.    Baseline 8/10 pain during and after walking 1 mile    Time 8    Period Weeks    Status New    Target Date 07/24/20                 Plan - 06/05/20 1232    Clinical Impression Statement Tautness and palpable tenderness present about Rt gluteal musuclature most notable along sacral attachment with partial release from manual therapy. Consider TPDN at future sessions if tautness remains. Due to reports of Lt ankle discomfort with supine figure 4 stretch, stretch modified to complete in sitting with patient reporting no ankle discomfort. Overall good tolerance to light progression of hip strengthening without reports of pain, though patient reports more difficulty performing strengthening exercises on the RLE compared to LLE.    Personal Factors and Comorbidities Time since onset of injury/illness/exacerbation;Comorbidity 3+    Comorbidities see subjective    Examination-Activity Limitations Bend;Carry;Lift;Sleep;Stairs;Stand;Locomotion Level    Examination-Participation Restrictions Cleaning;Meal Prep   playing with grandchildren   Rehab Potential Good    PT Frequency 2x / week    PT Duration 8 weeks    PT Treatment/Interventions ADLs/Self Care Home Management;Cryotherapy;Dentist;Therapeutic activities;Therapeutic exercise;Balance training;Neuromuscular re-education;Patient/family education;Manual  techniques;Dry needling;Taping    PT Next Visit Plan update HEP to include hip strengthening.. Manual therapy/TPDN to Rt gluteals/piriformis, core stabilization, hip strengthening    PT Home Exercise  Plan Access Code: [email or web address]    Consulted and Agree with Plan of Care Patient           Patient will benefit from skilled therapeutic intervention in order to improve the following deficits and impairments:  Decreased range of motion,Difficulty walking,Decreased endurance,Decreased activity tolerance,Pain,Impaired flexibility,Decreased strength,Postural dysfunction  Visit Diagnosis: Chronic bilateral low back pain, unspecified whether sciatica present  Muscle weakness (generalized)     Problem List Patient Active Problem List   Diagnosis Date Noted  . Hallux rigidus of left foot 10/19/2019  . GERD (gastroesophageal reflux disease) 01/26/2019  . Constipation 01/26/2019  . MDD (major depressive disorder) 11/12/2018  . Osteoarthritis 02/08/2018  . 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 Allergy 02/08/2018  . Onychomycosis 02/08/2018  . Cat bite   . 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 Tenosynovitis of left foot   . Foot abscess, left 10/09/2017  . Eczema 07/24/2014   Gwendolyn Grant, PT, DPT, ATC 06/05/20 1:21 PM  [facility] [address] [place], [place], [address] Phone: [phone]   Fax:  [phone]  Name: Madison Bryant MRN: [id number] Date of Birth: March 06, 1956

## 2020-06-12 ENCOUNTER — Other Ambulatory Visit: Payer: Self-pay

## 2020-06-12 ENCOUNTER — Encounter: Payer: Self-pay | Admitting: Physical Therapy

## 2020-06-12 ENCOUNTER — Ambulatory Visit: Payer: BC Managed Care – PPO | Attending: Family Medicine | Admitting: Physical Therapy

## 2020-06-12 DIAGNOSIS — M6281 Muscle weakness (generalized): Secondary | ICD-10-CM | POA: Diagnosis present

## 2020-06-12 DIAGNOSIS — M545 Low back pain, unspecified: Secondary | ICD-10-CM | POA: Diagnosis not present

## 2020-06-12 DIAGNOSIS — G8929 Other chronic pain: Secondary | ICD-10-CM | POA: Diagnosis present

## 2020-06-12 NOTE — Therapy (Signed)
[place], [place], [address] Phone: [phone]   Fax:  [phone]  Physical Therapy Treatment  Patient Details  Name: Madison Bryant MRN: [id number] Date of Birth: [birth year] Referring Provider (PT): Buelah Manis, Modena Nunnery, MD   Encounter Date: 06/12/2020   PT End of Session - 06/12/20 1635    Visit Number 3    Number of Visits 17    Date for PT Re-Evaluation 07/28/20    PT Start Time 1632    PT Stop Time 0200    PT Time Calculation (min) 568 min    Activity Tolerance Patient tolerated treatment well    Behavior During Therapy [facility] for tasks assessed/performed           Past Medical History:  Diagnosis Date  . Allergy   . Ankle fracture    left  . Arthritis    fingers, knees and hips  . Asthma   . Cellulitis    left foot  . GERD (gastroesophageal reflux disease)    OTC med prn  . Osteoporosis   . PONV (postoperative nausea and vomiting)   . Wears glasses     Past Surgical History:  Procedure Laterality Date  . ABDOMINAL HYSTERECTOMY    . I & D EXTREMITY Left 10/09/2017   Procedure: IRRIGATION AND DEBRIDEMENT LEFT FOOT;  Surgeon: Leandrew Koyanagi, MD;  Location: [place];  Service: Orthopedics;  Laterality: Left;  . ORIF FIBULA FRACTURE Left 08/21/2016   Procedure: OPEN REDUCTION INTERNAL FIXATION (ORIF) DISTAL FIBULA FRACTURE;  Surgeon: Ninetta Lights, MD;  Location: [place];  Service: Orthopedics;  Laterality: Left;  . ROTATOR CUFF REPAIR Right   . TONSILLECTOMY    . TUBAL LIGATION      There were no vitals filed for this visit.   Subjective Assessment - 06/12/20 1636    Subjective " I am doing much better. I still have issue with vacuuming and bending forward, which it really fatigues."    Patient Stated Goals "motivated/able to move and walk more and find exercises I can do to help with weight gain" (would love to walk 3-4 miles at least 4 times per week)    Currently  in Pain? No/denies              [facility] PT Assessment - 06/12/20 0001      Assessment   Medical Diagnosis DDD (degenerative disc disease), lumbosacral, Arthritis    Referring Provider (PT) Buelah Manis, Modena Nunnery, MD                         [facility] Adult PT Treatment/Exercise - 06/12/20 0001      Lumbar Exercises: Stretches   Active Hamstring Stretch 4 reps;30 seconds;Right   PNF contract/ relax     Lumbar Exercises: Aerobic   Recumbent Bike L3 x 4 min    Nustep --      Lumbar Exercises: Supine   Straight Leg Raise 15 reps   x 2 on the RLE     Manual Therapy   Manual Therapy Joint mobilization;Soft tissue mobilization    Manual therapy comments skilled palpation and monitoring of pt during TPDN    Joint Mobilization LAD grade IV RLE only    Soft tissue mobilization IASTM along the R lumbar paraspinals            Trigger Point Dry Needling - 06/12/20 0001    Consent Given? Yes  Education Handout Provided Yes    Muscles Treated Back/Hip Lumbar multifidi    Lumbar multifidi Response Twitch response elicited;Palpable increased muscle length   R               PT Education - 06/12/20 1659    Education Details muscle anatomy and benefits of TPDN. updated HEP for SLR. reviewed SIJ and effects of surrounding musculature.    Person(s) Educated Patient    Methods Explanation;Verbal cues    Comprehension Verbalized understanding;Verbal cues required            PT Short Term Goals - 05/29/20 1536      PT SHORT TERM GOAL #1   Title Patient will be independent and compliant with initial HEP.    Baseline issued at eval    Time 3    Period Weeks    Status New    Target Date 06/19/20      PT SHORT TERM GOAL #2   Title Patient will self-report tolerating 30 minutes of continuous standing activity at home with less than 3/10 low back pain.    Baseline Patient reports 8/10 back pain within 30 minutes of standing to wash dishes.    Time 4    Period Weeks     Status New    Target Date 06/26/20             PT Long Term Goals - 05/29/20 1538      PT LONG TERM GOAL #1   Title Patient will score at least 66% function on FOTO to signify clinically meaningful improvement in functional abilities.    Baseline 53% function    Time 8    Period Weeks    Status New    Target Date 07/24/20      PT LONG TERM GOAL #2   Title Patient will demonstrate at least 4+/5 strength in bilateral hips to improve stability about the chain necessary for prolonged walking activity.    Baseline see flowsheet    Time 8    Period Weeks    Status New    Target Date 07/24/20      PT LONG TERM GOAL #3   Title Patient will self report ability to ascend flight of stairs in her home without difficulty.    Baseline Patient reports a lot of difficulty with ascending stairs at home.    Time 8    Period Weeks    Status New    Target Date 07/24/20      PT LONG TERM GOAL #4   Title Patient will self-report ability to walk for at least 2 miles with less than 2/10 low back pain.    Baseline 8/10 pain during and after walking 1 mile    Time 8    Period Weeks    Status New    Target Date 07/24/20                 Plan - 06/12/20 1713    Clinical Impression Statement Reviewed HEp which she reports consistency with. conitnued working on SIJ which she demonstrates potential posterior rotation and responded favorably to hamstring stretch followeding with LAD and SLR focusing on the R. educated and consent was provided for TPDN focusing on the R lumbar multifidi followed with IASTM techniques. end of session she noted improvement with trunk flexion and reduced low back pain.    PT Treatment/Interventions ADLs/Self Care Home Management;Cryotherapy;Publishing copy;Therapeutic activities;Therapeutic exercise;Balance training;Neuromuscular re-education;Patient/family education;Manual techniques;Dry  needling;Taping    PT Next  Visit Plan update HEP to include hip strengthening. REsponse to TPDN for lumbar multifidi,  Manual therapy/TPDN to Rt gluteals/piriformis, hip strengthening, hamstring stretching and hip flexor activation/ corestrengthening.    PT Home Exercise Plan Access Code: [email or web address]    Consulted and Agree with Plan of Care Patient           Patient will benefit from skilled therapeutic intervention in order to improve the following deficits and impairments:  Decreased range of motion,Difficulty walking,Decreased endurance,Decreased activity tolerance,Pain,Impaired flexibility,Decreased strength,Postural dysfunction  Visit Diagnosis: Chronic bilateral low back pain, unspecified whether sciatica present  Muscle weakness (generalized)     Problem List Patient Active Problem List   Diagnosis Date Noted  . Hallux rigidus of left foot 10/19/2019  . GERD (gastroesophageal reflux disease) 01/26/2019  . Constipation 01/26/2019  . MDD (major depressive disorder) 11/12/2018  . Osteoarthritis 02/08/2018  . 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 Allergy 02/08/2018  . Onychomycosis 02/08/2018  . Cat bite   . 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 Tenosynovitis of left foot   . Foot abscess, left 10/09/2017  . Eczema 07/24/2014   Starr Lake PT, DPT, LAT, ATC  06/12/20  5:17 PM      [place] [facility] [address] [place], [place], [address] Phone: [phone]   Fax:  [phone]  Name: HARLOW BASLEY MRN: [id number] Date of Birth: 1955-12-10

## 2020-06-14 ENCOUNTER — Encounter: Payer: Self-pay | Admitting: Physical Therapy

## 2020-06-16 ENCOUNTER — Ambulatory Visit: Payer: BC Managed Care – PPO | Admitting: Rehabilitative and Restorative Service Providers"

## 2020-06-16 ENCOUNTER — Encounter: Payer: Self-pay | Admitting: Rehabilitative and Restorative Service Providers"

## 2020-06-16 ENCOUNTER — Other Ambulatory Visit: Payer: Self-pay

## 2020-06-16 DIAGNOSIS — M545 Low back pain, unspecified: Secondary | ICD-10-CM | POA: Diagnosis not present

## 2020-06-16 DIAGNOSIS — G8929 Other chronic pain: Secondary | ICD-10-CM

## 2020-06-16 DIAGNOSIS — M6281 Muscle weakness (generalized): Secondary | ICD-10-CM

## 2020-06-16 NOTE — Therapy (Signed)
[place], [place], [address] Phone: [phone]   Fax:  [phone]  Physical Therapy Treatment  Patient Details  Name: Madison Bryant MRN: [id number] Date of Birth: July 18, 1955 Referring Provider (PT): Buelah Manis, Modena Nunnery, MD   Encounter Date: 06/16/2020   PT End of Session - 06/16/20 0853    Visit Number 4    Number of Visits 17    Date for PT Re-Evaluation 07/28/20    PT Start Time 0856    PT Stop Time 0937    PT Time Calculation (min) 41 min    Activity Tolerance Patient tolerated treatment well;No increased pain    Behavior During Therapy [facility]FL for tasks assessed/performed           Past Medical History:  Diagnosis Date  . Allergy   . Ankle fracture    left  . Arthritis    fingers, knees and hips  . Asthma   . Cellulitis    left foot  . GERD (gastroesophageal reflux disease)    OTC med prn  . Osteoporosis   . PONV (postoperative nausea and vomiting)   . Wears glasses     Past Surgical History:  Procedure Laterality Date  . ABDOMINAL HYSTERECTOMY    . I & D EXTREMITY Left 10/09/2017   Procedure: IRRIGATION AND DEBRIDEMENT LEFT FOOT;  Surgeon: Leandrew Koyanagi, MD;  Location: [place];  Service: Orthopedics;  Laterality: Left;  . ORIF FIBULA FRACTURE Left 08/21/2016   Procedure: OPEN REDUCTION INTERNAL FIXATION (ORIF) DISTAL FIBULA FRACTURE;  Surgeon: Ninetta Lights, MD;  Location: [place];  Service: Orthopedics;  Laterality: Left;  . ROTATOR CUFF REPAIR Right   . TONSILLECTOMY    . TUBAL LIGATION      There were no vitals filed for this visit.   Subjective Assessment - 06/16/20 0856    Subjective It is tender today but overall much better. 3-4/10 pain in right hip.    Currently in Pain? Yes    Pain Score 3     Pain Location Hip    Pain Orientation Right    Pain Descriptors / Indicators Aching;Tender    Pain Type Chronic pain    Multiple Pain Sites No                              [facility] Adult PT Treatment/Exercise - 06/16/20 0001      Lumbar Exercises: Standing   Other Standing Lumbar Exercises standing hip flexor bil 2x30 sec      Lumbar Exercises: Seated   Other Seated Lumbar Exercises Recumbent bike level 3, seated hamstring stretch performed bil 2x30 sec, seated lumbar flexion 2x30 sec; seated R Piriformis stretch 2x30 sec; repeat of seated Piriformis stretch/lumbar flexion stretch x 30 sec each after tx.      Lumbar Exercises: Supine   Other Supine Lumbar Exercises tilt with bridge x 15; tilt with SLR x 15, tilt with SLR/hip abdct/addct combo x 15, tilt with bridge x 15 to actively stretch hip flexors again, butterfly stretch 2x30 sec; hamstring stretch 2x30 sec      Lumbar Exercises: Sidelying   Other Sidelying Lumbar Exercises yellow band hip abduct x 20 with knees flexed and tilt maintained                  PT Education - 06/16/20 0953    Education Details pt states she felt  fine after dry needling; discussed with pt maintaining pelvic tilt during all activities to alleviate lumbar strain; she is able to return demo in all positions proper technique    Person(s) Educated Patient    Methods Explanation;Demonstration    Comprehension Verbalized understanding;Returned demonstration            PT Short Term Goals - 06/16/20 0957      PT SHORT TERM GOAL #1   Title Patient will be independent and compliant with initial HEP.    Status On-going      PT SHORT TERM GOAL #2   Title Patient will self-report tolerating 30 minutes of continuous standing activity at home with less than 3/10 low back pain.    Status On-going             PT Long Term Goals - 05/29/20 1538      PT LONG TERM GOAL #1   Title Patient will score at least 66% function on FOTO to signify clinically meaningful improvement in functional abilities.    Baseline 53% function    Time 8    Period Weeks    Status New    Target Date  07/24/20      PT LONG TERM GOAL #2   Title Patient will demonstrate at least 4+/5 strength in bilateral hips to improve stability about the chain necessary for prolonged walking activity.    Baseline see flowsheet    Time 8    Period Weeks    Status New    Target Date 07/24/20      PT LONG TERM GOAL #3   Title Patient will self report ability to ascend flight of stairs in her home without difficulty.    Baseline Patient reports a lot of difficulty with ascending stairs at home.    Time 8    Period Weeks    Status New    Target Date 07/24/20      PT LONG TERM GOAL #4   Title Patient will self-report ability to walk for at least 2 miles with less than 2/10 low back pain.    Baseline 8/10 pain during and after walking 1 mile    Time 8    Period Weeks    Status New    Target Date 07/24/20                 Plan - 06/16/20 0954    Clinical Impression Statement Pt presents to PT with decreased lumbar and right hip pain. She is able to perform all exercises without increased pain. She reports dry needling to help with pain management along with exercise. Pt would continue to benefit from lumbar/core strengthening and flexibility and bil hip flexibility R > L.    PT Treatment/Interventions ADLs/Self Care Home Management;Cryotherapy;Dentist;Therapeutic activities;Therapeutic exercise;Balance training;Neuromuscular re-education;Patient/family education;Manual techniques;Dry needling;Taping    PT Next Visit Plan update HEP to include hip/core strengthening.  Manual therapy/TPDN to Rt gluteals/piriformis, hip strengthening, hamstring stretching and hip flexor activation/ corestrengthening.    Consulted and Agree with Plan of Care Patient           Patient will benefit from skilled therapeutic intervention in order to improve the following deficits and impairments:  Decreased range of motion,Difficulty walking,Decreased  endurance,Decreased activity tolerance,Pain,Impaired flexibility,Decreased strength,Postural dysfunction  Visit Diagnosis: Muscle weakness (generalized)  Chronic bilateral low back pain, unspecified whether sciatica present     Problem List Patient Active Problem List   Diagnosis Date Noted  .  Hallux rigidus of left foot 10/19/2019  . GERD (gastroesophageal reflux disease) 01/26/2019  . Constipation 01/26/2019  . MDD (major depressive disorder) 11/12/2018  . Osteoarthritis 02/08/2018  . 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 Allergy 02/08/2018  . Onychomycosis 02/08/2018  . Cat bite   . 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 Tenosynovitis of left foot   . Foot abscess, left 10/09/2017  . Eczema 07/24/2014    Myra Rude, PT 06/16/2020, 9:59 AM  [facility] [address] [place], [place], [address] Phone: [phone]   Fax:  [phone]  Name: JERE BRENNICK MRN: [id number] Date of Birth: May 09, 1956

## 2020-06-18 ENCOUNTER — Other Ambulatory Visit: Payer: Self-pay

## 2020-06-18 ENCOUNTER — Ambulatory Visit: Payer: BC Managed Care – PPO

## 2020-06-18 DIAGNOSIS — M545 Low back pain, unspecified: Secondary | ICD-10-CM | POA: Diagnosis not present

## 2020-06-18 DIAGNOSIS — M6281 Muscle weakness (generalized): Secondary | ICD-10-CM

## 2020-06-18 DIAGNOSIS — G8929 Other chronic pain: Secondary | ICD-10-CM

## 2020-06-18 NOTE — Therapy (Signed)
[place], [place], [address] Phone: [phone]   Fax:  [phone]  Physical Therapy Treatment  Patient Details  Name: Madison Bryant MRN: [id number] Date of Birth: 20-Apr-1956 Referring Provider (PT): Buelah Manis, Modena Nunnery, MD   Encounter Date: 06/18/2020   PT End of Session - 06/18/20 1321    Visit Number 5    Number of Visits 17    Date for PT Re-Evaluation 07/28/20    PT Start Time 1332    PT Stop Time 1415    PT Time Calculation (min) 43 min    Activity Tolerance Patient tolerated treatment well    Behavior During Therapy [facility] for tasks assessed/performed           Past Medical History:  Diagnosis Date  . Allergy   . Ankle fracture    left  . Arthritis    fingers, knees and hips  . Asthma   . Cellulitis    left foot  . GERD (gastroesophageal reflux disease)    OTC med prn  . Osteoporosis   . PONV (postoperative nausea and vomiting)   . Wears glasses     Past Surgical History:  Procedure Laterality Date  . ABDOMINAL HYSTERECTOMY    . I & D EXTREMITY Left 10/09/2017   Procedure: IRRIGATION AND DEBRIDEMENT LEFT FOOT;  Surgeon: Leandrew Koyanagi, MD;  Location: [place];  Service: Orthopedics;  Laterality: Left;  . ORIF FIBULA FRACTURE Left 08/21/2016   Procedure: OPEN REDUCTION INTERNAL FIXATION (ORIF) DISTAL FIBULA FRACTURE;  Surgeon: Ninetta Lights, MD;  Location: [place];  Service: Orthopedics;  Laterality: Left;  . ROTATOR CUFF REPAIR Right   . TONSILLECTOMY    . TUBAL LIGATION      There were no vitals filed for this visit.   Subjective Assessment - 06/18/20 1334    Subjective Patient reports the exercises on Saturday really helped. She just had a fall at the gas station where she tripped over the hose and landed on her knee and elbow. Denies hitting her head and was able to get up after the fall. She denies any back pain from the fall or currently.    Pertinent  History osteoporosis, major depressive disorder, ORIF Lt distal fibula fracture 2018    Currently in Pain? Yes    Pain Score 9     Pain Location Knee    Pain Orientation Right    Pain Descriptors / Indicators Aching    Pain Type Acute pain    Pain Onset Today    Pain Frequency Constant              [facility] PT Assessment - 06/18/20 0001      Observation/Other Assessments   Observations redness and abrasion on Rt knee; no obvious swelling, deformity                         [facility] Adult PT Treatment/Exercise - 06/18/20 0001      Lumbar Exercises: Stretches   Other Lumbar Stretch Exercise LTR 1 min      Lumbar Exercises: Standing   Other Standing Lumbar Exercises pallof press green band 2 x 10      Lumbar Exercises: Seated   Other Seated Lumbar Exercises pelvic tilt 2 x 10    Other Seated Lumbar Exercises march 2 x 10      Lumbar Exercises: Supine   Bent Knee Raise Limitations 2  x 20    Straight Leg Raises Limitations 2 x 10 each      Lumbar Exercises: Sidelying   Clam Limitations 2 x 15 green band    Hip Abduction Limitations 2 x 10                  PT Education - 06/18/20 1346    Education Details Education on signs/symptoms of infection given Rt knee abrasion. Education on modalities for pain control. Education on updated HEP.    Person(s) Educated Patient    Methods Explanation;Demonstration;Handout;Verbal cues    Comprehension Verbalized understanding;Returned demonstration;Verbal cues required            PT Short Term Goals - 06/16/20 0957      PT SHORT TERM GOAL #1   Title Patient will be independent and compliant with initial HEP.    Status On-going      PT SHORT TERM GOAL #2   Title Patient will self-report tolerating 30 minutes of continuous standing activity at home with less than 3/10 low back pain.    Status On-going             PT Long Term Goals - 05/29/20 1538      PT LONG TERM GOAL #1   Title Patient will score at  least 66% function on FOTO to signify clinically meaningful improvement in functional abilities.    Baseline 53% function    Time 8    Period Weeks    Status New    Target Date 07/24/20      PT LONG TERM GOAL #2   Title Patient will demonstrate at least 4+/5 strength in bilateral hips to improve stability about the chain necessary for prolonged walking activity.    Baseline see flowsheet    Time 8    Period Weeks    Status New    Target Date 07/24/20      PT LONG TERM GOAL #3   Title Patient will self report ability to ascend flight of stairs in her home without difficulty.    Baseline Patient reports a lot of difficulty with ascending stairs at home.    Time 8    Period Weeks    Status New    Target Date 07/24/20      PT LONG TERM GOAL #4   Title Patient will self-report ability to walk for at least 2 miles with less than 2/10 low back pain.    Baseline 8/10 pain during and after walking 1 mile    Time 8    Period Weeks    Status New    Target Date 07/24/20                 Plan - 06/18/20 1320    Clinical Impression Statement Patient arrives having recently sustained a fall at the gas station where she tripped over the hose and landed on her Rt knee and Lt elbow. Overall she tolerated session well today given her complaints of knee and elbow pain with ability to progress hip strengthening and core stabilization without exacerbating her acute knee/elbow pain. Minor cues required for pelvic alignment, though overall demonstrating good core control with exercises.    PT Treatment/Interventions ADLs/Self Care Home Management;Cryotherapy;Dentist;Therapeutic activities;Therapeutic exercise;Balance training;Neuromuscular re-education;Patient/family education;Manual techniques;Dry needling;Taping    PT Next Visit Plan Manual therapy/TPDN to Rt gluteals/piriformis, hip strengthening, hamstring stretching and hip flexor  activation/ corestrengthening.    PT Home Exercise Plan Access Code:  [email or web address]    Consulted and Agree with Plan of Care Patient           Patient will benefit from skilled therapeutic intervention in order to improve the following deficits and impairments:  Decreased range of motion,Difficulty walking,Decreased endurance,Decreased activity tolerance,Pain,Impaired flexibility,Decreased strength,Postural dysfunction  Visit Diagnosis: Muscle weakness (generalized)  Chronic bilateral low back pain, unspecified whether sciatica present     Problem List Patient Active Problem List   Diagnosis Date Noted  . Hallux rigidus of left foot 10/19/2019  . GERD (gastroesophageal reflux disease) 01/26/2019  . Constipation 01/26/2019  . MDD (major depressive disorder) 11/12/2018  . Osteoarthritis 02/08/2018  . 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 Allergy 02/08/2018  . Onychomycosis 02/08/2018  . Cat bite   . 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 Tenosynovitis of left foot   . Foot abscess, left 10/09/2017  . Eczema 07/24/2014   Gwendolyn Grant, PT, DPT, ATC 06/18/20 2:20 PM [facility] [address] [place], [place], [address] Phone: [phone]   Fax:  [phone]  Name: Madison Bryant MRN: [id number] Date of Birth: Sep 13, 1955

## 2020-06-20 ENCOUNTER — Ambulatory Visit: Payer: BC Managed Care – PPO

## 2020-06-20 ENCOUNTER — Other Ambulatory Visit: Payer: Self-pay

## 2020-06-20 DIAGNOSIS — M545 Low back pain, unspecified: Secondary | ICD-10-CM

## 2020-06-20 DIAGNOSIS — M6281 Muscle weakness (generalized): Secondary | ICD-10-CM

## 2020-06-20 NOTE — Therapy (Signed)
Madison Bryant, [place], [address] Phone: [phone]   Fax:  [phone]  Physical Therapy Treatment  Patient Details  Name: Madison Bryant MRN: [id number] Date of Birth: [birth year] Referring Provider (PT): Buelah Manis, Modena Nunnery, MD   Encounter Date: 06/20/2020   PT End of Session - 06/20/20 1339    Visit Number 6    Number of Visits 17    Date for PT Re-Evaluation 07/28/20    PT Start Time 1331    PT Stop Time 1412    PT Time Calculation (min) 41 min    Activity Tolerance Patient tolerated treatment well    Behavior During Therapy [facility] for tasks assessed/performed           Past Medical History:  Diagnosis Date  . Allergy   . Ankle fracture    left  . Arthritis    fingers, knees and hips  . Asthma   . Cellulitis    left foot  . GERD (gastroesophageal reflux disease)    OTC med prn  . Osteoporosis   . PONV (postoperative nausea and vomiting)   . Wears glasses     Past Surgical History:  Procedure Laterality Date  . ABDOMINAL HYSTERECTOMY    . I & D EXTREMITY Left 10/09/2017   Procedure: IRRIGATION AND DEBRIDEMENT LEFT FOOT;  Surgeon: Leandrew Koyanagi, MD;  Location: [place];  Service: Orthopedics;  Laterality: Left;  . ORIF FIBULA FRACTURE Left 08/21/2016   Procedure: OPEN REDUCTION INTERNAL FIXATION (ORIF) DISTAL FIBULA FRACTURE;  Surgeon: Ninetta Lights, MD;  Location: [place];  Service: Orthopedics;  Laterality: Left;  . ROTATOR CUFF REPAIR Right   . TONSILLECTOMY    . TUBAL LIGATION      There were no vitals filed for this visit.   Subjective Assessment - 06/20/20 1334    Subjective She reports the knee and elbow are feeling better since her fall on Tuesday with some soreness in her Rt knee. She reports the back has been feeling fine without any pain since last session.    Pertinent History osteoporosis, major depressive disorder, ORIF Lt distal fibula fracture 2018     Currently in Pain? Yes    Pain Score 4     Pain Location Knee    Pain Orientation Right    Pain Descriptors / Indicators Aching    Pain Type Acute pain    Pain Onset Today    Pain Frequency Constant              [facility] PT Assessment - 06/20/20 0001      Observation/Other Assessments   Focus on Therapeutic Outcomes (FOTO)  60% function                         [facility] Adult PT Treatment/Exercise - 06/20/20 0001      Self-Care   Other Self-Care Comments  see patient education      Lumbar Exercises: Aerobic   Nustep L6 x 5 min LE only      Lumbar Exercises: Seated   Other Seated Lumbar Exercises pelvic tilt 2 x 10    Other Seated Lumbar Exercises march 1 x 20; march with alternating UE reach 2 x 10      Lumbar Exercises: Supine   Bent Knee Raise Limitations 1 x 20    Dead Bug 10 reps   x2   Dead Bug Limitations LE only  Bridge with [employer] 15 reps    Bridge with [employer] Limitations x 2    Straight Leg Raise 10 reps    Straight Leg Raises Limitations x 2 with square trace    Other Supine Lumbar Exercises modified 100s 2x 20      Lumbar Exercises: Sidelying   Other Sidelying Lumbar Exercises sidleying toe taps fwd/bwd 2 x 10 each                  PT Education - 06/20/20 1343    Education Details FOTO results and progress thus far.    Person(s) Educated Patient    Methods Explanation    Comprehension Verbalized understanding            PT Short Term Goals - 06/16/20 0957      PT SHORT TERM GOAL #1   Title Patient will be independent and compliant with initial HEP.    Status On-going      PT SHORT TERM GOAL #2   Title Patient will self-report tolerating 30 minutes of continuous standing activity at home with less than 3/10 low back pain.    Status On-going             PT Long Term Goals - 05/29/20 1538      PT LONG TERM GOAL #1   Title Patient will score at least 66% function on FOTO to signify clinically meaningful  improvement in functional abilities.    Baseline 53% function    Time 8    Period Weeks    Status New    Target Date 07/24/20      PT LONG TERM GOAL #2   Title Patient will demonstrate at least 4+/5 strength in bilateral hips to improve stability about the chain necessary for prolonged walking activity.    Baseline see flowsheet    Time 8    Period Weeks    Status New    Target Date 07/24/20      PT LONG TERM GOAL #3   Title Patient will self report ability to ascend flight of stairs in her home without difficulty.    Baseline Patient reports a lot of difficulty with ascending stairs at home.    Time 8    Period Weeks    Status New    Target Date 07/24/20      PT LONG TERM GOAL #4   Title Patient will self-report ability to walk for at least 2 miles with less than 2/10 low back pain.    Baseline 8/10 pain during and after walking 1 mile    Time 8    Period Weeks    Status New    Target Date 07/24/20                 Plan - 06/20/20 1342    Clinical Impression Statement Patient tolerated session well today with progression of seated and supine dynamic core stabilization without reports of low back pain, though quickly fatigues. Patient has difficulty maintaining pelvic stability when performing hip abductor stregnthening on the LLE, though no difficulty on the RLE.  Her FOTO score is trending towards anticipated functional outcome of 66% function, scoring 60% function during today's session.    PT Treatment/Interventions ADLs/Self Care Home Management;Cryotherapy;Dentist;Therapeutic activities;Therapeutic exercise;Balance training;Neuromuscular re-education;Patient/family education;Manual techniques;Dry needling;Taping    PT Next Visit Plan Manual therapy/TPDN to Rt gluteals/piriformis, hip strengthening, hamstring stretching and hip flexor activation/ corestrengthening.standing/quadruped core strengthening.  PT Home  Exercise Plan Access Code: [email or web address]    Consulted and Agree with Plan of Care Patient           Patient will benefit from skilled therapeutic intervention in order to improve the following deficits and impairments:  Decreased range of motion,Difficulty walking,Decreased endurance,Decreased activity tolerance,Pain,Impaired flexibility,Decreased strength,Postural dysfunction  Visit Diagnosis: Muscle weakness (generalized)  Chronic bilateral low back pain, unspecified whether sciatica present     Problem List Patient Active Problem List   Diagnosis Date Noted  . Hallux rigidus of left foot 10/19/2019  . GERD (gastroesophageal reflux disease) 01/26/2019  . Constipation 01/26/2019  . MDD (major depressive disorder) 11/12/2018  . Osteoarthritis 02/08/2018  . 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 Allergy 02/08/2018  . Onychomycosis 02/08/2018  . Cat bite   . 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 Tenosynovitis of left foot   . Foot abscess, left 10/09/2017  . Eczema 07/24/2014   Gwendolyn Grant, PT, DPT, ATC 06/20/20 2:14 PM  [facility] [address] [place], [place], [address] Phone: [phone]   Fax:  [phone]  Name: Madison Bryant MRN: [id number] Date of Birth: July 22, 1955

## 2020-06-26 ENCOUNTER — Ambulatory Visit: Payer: BC Managed Care – PPO | Admitting: Orthopaedic Surgery

## 2020-06-26 ENCOUNTER — Ambulatory Visit: Payer: BC Managed Care – PPO

## 2020-06-26 ENCOUNTER — Encounter: Payer: Self-pay | Admitting: Orthopaedic Surgery

## 2020-06-26 DIAGNOSIS — M25561 Pain in right knee: Secondary | ICD-10-CM

## 2020-06-26 NOTE — Progress Notes (Signed)
Office Visit Note   Patient: Madison Bryant           Date of Birth: 1956/04/14           MRN: [id number] Visit Date: 06/26/2020              Requested by: Susy Frizzle, MD [address] [place],  [place] [address] PCP: Susy Frizzle, MD   Assessment & Plan: Visit Diagnoses:  1. 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  I doubt that she has a true fracture.  She is ambulating and mobilizing and weightbearing quite well without any pain.  I recommend that she can discontinue the knee immobilizer and continue with over-the-counter medications and ice and gradually increase activity as tolerated.  She may resume physical therapy for her back.  We will see her back as needed.  Follow-Up Instructions: Return if symptoms worsen or fail to improve.   Orders:  No orders of the defined types were placed in this encounter.  No orders of the defined types were placed in this encounter.     Procedures: No procedures performed   Clinical Data: No additional findings.   Subjective: Chief Complaint  Patient presents with  . Right Knee - Pain    Madison Bryant is a 65 year old female patient of mine from few years ago comes in with a new injury to the right knee.  She fell directly on the right knee about a week ago when she was carrying groceries.  Initially evaluated at an urgent care x-rays were concerning for fracture versus bipartite patella.  She has been weightbearing and ambulating with a knee immobilizer without any pain.  She takes Tylenol Advil and use ice and this has been very helpful.  She has some pain at nighttime.  Denies any significant swelling or bruising.  Range of motion does not cause any significant pain.  Denies any mechanical symptoms.   Review of Systems  Constitutional: Negative.   HENT: Negative.   Eyes: Negative.   Respiratory: Negative.   Cardiovascular: Negative.    Endocrine: Negative.   Musculoskeletal: Negative.   Neurological: Negative.   Hematological: Negative.   Psychiatric/Behavioral: Negative.   All other systems reviewed and are negative.    Objective: Vital Signs: There were no vitals taken for this visit.  Physical Exam Vitals and nursing note reviewed.  Constitutional:      Appearance: She is well-developed and well-nourished.  Pulmonary:     Effort: Pulmonary effort is normal.  Skin:    General: Skin is warm.     Capillary Refill: Capillary refill takes less than 2 seconds.  Neurological:     Mental Status: She is alert and oriented to person, place, and time.  Psychiatric:        Mood and Affect: Mood and affect normal.        Behavior: Behavior normal.        Thought Content: Thought content normal.        Judgment: Judgment normal.     Ortho Exam Right knee shows mild swelling of the soft tissues.  No joint effusion.  Mild ecchymosis.  Slight tenderness of the patella.  No joint line tenderness.  Because her cruciates are stable.  Range of motion to 90 degrees does not cause any pain. Specialty Comments:  No specialty comments available.  Imaging: No results found.   [place]  History: Patient Active Problem List   Diagnosis Date Noted  . Hallux rigidus of left foot 10/19/2019  . GERD (gastroesophageal reflux disease) 01/26/2019  . Constipation 01/26/2019  . MDD (major depressive disorder) 11/12/2018  . Osteoarthritis 02/08/2018  . 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 Allergy 02/08/2018  . Onychomycosis 02/08/2018  . Cat bite   . 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 Tenosynovitis of left foot   . Foot abscess, left 10/09/2017  . Eczema 07/24/2014   Past Medical History:  Diagnosis Date  . Allergy   . Ankle fracture    left  . Arthritis    fingers, knees and hips  . Asthma   . Cellulitis    left foot  . GERD (gastroesophageal reflux disease)    OTC med prn  . Osteoporosis   .  PONV (postoperative nausea and vomiting)   . Wears glasses     Family History  Problem Relation Age of Onset  . Miscarriages / [place] Mother     Past Surgical History:  Procedure Laterality Date  . ABDOMINAL HYSTERECTOMY    . I & D EXTREMITY Left 10/09/2017   Procedure: IRRIGATION AND DEBRIDEMENT LEFT FOOT;  Surgeon: Leandrew Koyanagi, MD;  Location: [place];  Service: Orthopedics;  Laterality: Left;  . ORIF FIBULA FRACTURE Left 08/21/2016   Procedure: OPEN REDUCTION INTERNAL FIXATION (ORIF) DISTAL FIBULA FRACTURE;  Surgeon: Ninetta Lights, MD;  Location: [place];  Service: Orthopedics;  Laterality: Left;  . ROTATOR CUFF REPAIR Right   . TONSILLECTOMY    . TUBAL LIGATION     Social History   Occupational History  . Not on file  Tobacco Use  . Smoking status: Never Smoker  . Smokeless tobacco: Never Used  Vaping Use  . Vaping Use: Never used  Substance and Sexual Activity  . Alcohol use: No  . Drug use: No  . Sexual activity: Yes

## 2020-06-28 ENCOUNTER — Ambulatory Visit: Payer: BC Managed Care – PPO

## 2020-06-28 ENCOUNTER — Other Ambulatory Visit: Payer: Self-pay

## 2020-06-28 DIAGNOSIS — M6281 Muscle weakness (generalized): Secondary | ICD-10-CM

## 2020-06-28 DIAGNOSIS — M545 Low back pain, unspecified: Secondary | ICD-10-CM | POA: Diagnosis not present

## 2020-06-28 DIAGNOSIS — G8929 Other chronic pain: Secondary | ICD-10-CM

## 2020-06-28 NOTE — Therapy (Signed)
[place], [place], [address] Phone: [phone]   Fax:  [phone]  Physical Therapy Treatment  Patient Details  Name: Madison Bryant MRN: [id number] Date of Birth: 1955/06/16 Referring Provider (PT): Buelah Manis, Modena Nunnery, MD   Encounter Date: 06/28/2020   PT End of Session - 06/28/20 1339    Visit Number 7    Number of Visits 17    Date for PT Re-Evaluation 07/28/20    PT Start Time 1332    PT Stop Time 1415    PT Time Calculation (min) 43 min    Activity Tolerance Patient tolerated treatment well    Behavior During Therapy [facility] for tasks assessed/performed           Past Medical History:  Diagnosis Date  . Allergy   . Ankle fracture    left  . Arthritis    fingers, knees and hips  . Asthma   . Cellulitis    left foot  . GERD (gastroesophageal reflux disease)    OTC med prn  . Osteoporosis   . PONV (postoperative nausea and vomiting)   . Wears glasses     Past Surgical History:  Procedure Laterality Date  . ABDOMINAL HYSTERECTOMY    . I & D EXTREMITY Left 10/09/2017   Procedure: IRRIGATION AND DEBRIDEMENT LEFT FOOT;  Surgeon: Leandrew Koyanagi, MD;  Location: [place];  Service: Orthopedics;  Laterality: Left;  . ORIF FIBULA FRACTURE Left 08/21/2016   Procedure: OPEN REDUCTION INTERNAL FIXATION (ORIF) DISTAL FIBULA FRACTURE;  Surgeon: Ninetta Lights, MD;  Location: [place];  Service: Orthopedics;  Laterality: Left;  . ROTATOR CUFF REPAIR Right   . TONSILLECTOMY    . TUBAL LIGATION      There were no vitals filed for this visit.   Subjective Assessment - 06/28/20 1336    Subjective Patient reports having increased Rt knee pain Friday night/Saturday morning and went to urgent care and was told it looked like she had a fracture of the knee cap. Patient had f/u wtih orthopedic specialist who diagnosed her with a contusion and told her to resume activity as tolerated and was  instructed to f/u in 3 weeks if needed. She was cleared to resume PT for her back by Dr. Erlinda Hong. She reports the back is feeling good without pain, though hasn't been completing exercises due to her knee issue.    Pertinent History osteoporosis, major depressive disorder, ORIF Lt distal fibula fracture 2018    Currently in Pain? Yes    Pain Score 2     Pain Location Knee    Pain Orientation Right    Pain Descriptors / Indicators Aching    Pain Type Acute pain    Pain Onset --                             [facility] Adult PT Treatment/Exercise - 06/28/20 0001      Lumbar Exercises: Stretches   Passive Hamstring Stretch 60 seconds    Passive Hamstring Stretch Limitations bilateral      Lumbar Exercises: Aerobic   Nustep L6 x 5 min LE only      Lumbar Exercises: Seated   Other Seated Lumbar Exercises pelvic tilt 1 x 20    Other Seated Lumbar Exercises march with opposite arm reach 2 x 10      Lumbar Exercises: Supine   Bent Knee Raise  Limitations 1 x 20    Dead Bug 10 reps    Dead Bug Limitations x2; LE only    Single Leg Bridge 10 reps    Straight Leg Raise 10 reps    Straight Leg Raises Limitations x2; 2 lbs    Other Supine Lumbar Exercises modified 100s 3 x 20      Lumbar Exercises: Sidelying   Hip Abduction 10 reps    Hip Abduction Weights (lbs) 2    Hip Abduction Limitations x 2    Other Sidelying Lumbar Exercises hip circles CW/CCW 2 x 10 each                    PT Short Term Goals - 06/28/20 1341      PT SHORT TERM GOAL #1   Title Patient will be independent and compliant with initial HEP.    Baseline not completed over past week secondary to knee    Status On-going      PT SHORT TERM GOAL #2   Title Patient will self-report tolerating 30 minutes of continuous standing activity at home with less than 3/10 low back pain.    Baseline patient reports ability to wash dishes and fold laundry for 30 minutes without back pain    Status Achieved              PT Long Term Goals - 05/29/20 1538      PT LONG TERM GOAL #1   Title Patient will score at least 66% function on FOTO to signify clinically meaningful improvement in functional abilities.    Baseline 53% function    Time 8    Period Weeks    Status New    Target Date 07/24/20      PT LONG TERM GOAL #2   Title Patient will demonstrate at least 4+/5 strength in bilateral hips to improve stability about the chain necessary for prolonged walking activity.    Baseline see flowsheet    Time 8    Period Weeks    Status New    Target Date 07/24/20      PT LONG TERM GOAL #3   Title Patient will self report ability to ascend flight of stairs in her home without difficulty.    Baseline Patient reports a lot of difficulty with ascending stairs at home.    Time 8    Period Weeks    Status New    Target Date 07/24/20      PT LONG TERM GOAL #4   Title Patient will self-report ability to walk for at least 2 miles with less than 2/10 low back pain.    Baseline 8/10 pain during and after walking 1 mile    Time 8    Period Weeks    Status New    Target Date 07/24/20                 Plan - 06/28/20 1339    Clinical Impression Statement Patient tolerated session well today without reports of back pain or increased knee pain, though occasional hamstring cramp during single leg bridge. Given patient's recent flare up of Rt knee pain over the weekend secondary to a fall she sustained early last week and patient instructed by orthopedic specialist to resume activity as tolerated strengthening exercises were progressed in [facility] today. Patient demonstrates improved lumbopelvic stability during core and hip strengthening, though requires continued progression into standing and functional activity.    PT Treatment/Interventions  ADLs/Self Care Home Management;Cryotherapy;Dentist;Therapeutic activities;Therapeutic exercise;Balance  training;Neuromuscular re-education;Patient/family education;Manual techniques;Dry needling;Taping    PT Next Visit Plan progress into standing hip and core strengthening as tolerated. consider core stablization on stability ball.    PT Home Exercise Plan Access Code: [email or web address]    Consulted and Agree with Plan of Care Patient           Patient will benefit from skilled therapeutic intervention in order to improve the following deficits and impairments:  Decreased range of motion,Difficulty walking,Decreased endurance,Decreased activity tolerance,Pain,Impaired flexibility,Decreased strength,Postural dysfunction  Visit Diagnosis: Muscle weakness (generalized)  Chronic bilateral low back pain, unspecified whether sciatica present     Problem List Patient Active Problem List   Diagnosis Date Noted  . Hallux rigidus of left foot 10/19/2019  . GERD (gastroesophageal reflux disease) 01/26/2019  . Constipation 01/26/2019  . MDD (major depressive disorder) 11/12/2018  . Osteoarthritis 02/08/2018  . 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 Allergy 02/08/2018  . Onychomycosis 02/08/2018  . Cat bite   . 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 Tenosynovitis of left foot   . Foot abscess, left 10/09/2017  . Eczema 07/24/2014   Gwendolyn Grant, PT, DPT, ATC 06/28/20 2:19 PM  [place] [facility] [address] [place], [place], [address] Phone: [phone]   Fax:  [phone]  Name: ANAB VIVAR MRN: [id number] Date of Birth: Apr 15, 1956

## 2020-07-02 ENCOUNTER — Other Ambulatory Visit: Payer: Self-pay

## 2020-07-02 ENCOUNTER — Ambulatory Visit: Payer: BC Managed Care – PPO

## 2020-07-02 DIAGNOSIS — M6281 Muscle weakness (generalized): Secondary | ICD-10-CM

## 2020-07-02 DIAGNOSIS — M545 Low back pain, unspecified: Secondary | ICD-10-CM | POA: Diagnosis not present

## 2020-07-02 NOTE — Therapy (Signed)
[place], [place], [address] Phone: [phone]   Fax:  [phone]  Physical Therapy Treatment  Patient Details  Name: Madison Bryant MRN: [id number] Date of Birth: [birth year] Referring Provider (PT): Buelah Manis, Modena Nunnery, MD   Encounter Date: 07/02/2020   PT End of Session - 07/02/20 1340    Visit Number 8    Number of Visits 17    Date for PT Re-Evaluation 07/28/20    PT Start Time 4656    PT Stop Time 1416    PT Time Calculation (min) 42 min    Activity Tolerance Patient tolerated treatment well    Behavior During Therapy [facility] for tasks assessed/performed           Past Medical History:  Diagnosis Date  . Allergy   . Ankle fracture    left  . Arthritis    fingers, knees and hips  . Asthma   . Cellulitis    left foot  . GERD (gastroesophageal reflux disease)    OTC med prn  . Osteoporosis   . PONV (postoperative nausea and vomiting)   . Wears glasses     Past Surgical History:  Procedure Laterality Date  . ABDOMINAL HYSTERECTOMY    . I & D EXTREMITY Left 10/09/2017   Procedure: IRRIGATION AND DEBRIDEMENT LEFT FOOT;  Surgeon: Leandrew Koyanagi, MD;  Location: [place];  Service: Orthopedics;  Laterality: Left;  . ORIF FIBULA FRACTURE Left 08/21/2016   Procedure: OPEN REDUCTION INTERNAL FIXATION (ORIF) DISTAL FIBULA FRACTURE;  Surgeon: Ninetta Lights, MD;  Location: [place];  Service: Orthopedics;  Laterality: Left;  . ROTATOR CUFF REPAIR Right   . TONSILLECTOMY    . TUBAL LIGATION      There were no vitals filed for this visit.   Subjective Assessment - 07/02/20 1336    Subjective Patient reports the back is feeling good without complaints of pain. She reports the "knee is getting there" having pain with certain movements.    Pertinent History osteoporosis, major depressive disorder, ORIF Lt distal fibula fracture 2018    Currently in Pain? Yes    Pain Score 1     Pain  Location Knee    Pain Orientation Right    Pain Descriptors / Indicators Aching    Pain Type Acute pain    Pain Onset 1 to 4 weeks ago    Pain Frequency Intermittent                             [facility] Adult PT Treatment/Exercise - 07/02/20 0001      Lumbar Exercises: Aerobic   Nustep L6 x 5 min LE only      Lumbar Exercises: Standing   Other Standing Lumbar Exercises hip hinge with dowel 2 x 10    Other Standing Lumbar Exercises 3 way hip 2 x 10;pallof press green band 2 x 10      Lumbar Exercises: Seated   Other Seated Lumbar Exercises pelvic tilts 2 x 20    Other Seated Lumbar Exercises      Lumbar Exercises: Supine   Dead Bug 10 reps    Dead Bug Limitations x 1 LE only; x2 UE/LE    Other Supine Lumbar Exercises windshield wiper 2 x 10      Lumbar Exercises: Sidelying   Clam 10 reps    Clam Limitations x2; black band  PT Short Term Goals - 07/02/20 1356      PT SHORT TERM GOAL #1   Title Patient will be independent and compliant with initial HEP.    Baseline patient reports compliance with HEP.    Status Achieved      PT SHORT TERM GOAL #2   Title Patient will self-report tolerating 30 minutes of continuous standing activity at home with less than 3/10 low back pain.    Baseline patient reports ability to wash dishes and fold laundry for 30 minutes without back pain    Status Achieved             PT Long Term Goals - 05/29/20 1538      PT LONG TERM GOAL #1   Title Patient will score at least 66% function on FOTO to signify clinically meaningful improvement in functional abilities.    Baseline 53% function    Time 8    Period Weeks    Status New    Target Date 07/24/20      PT LONG TERM GOAL #2   Title Patient will demonstrate at least 4+/5 strength in bilateral hips to improve stability about the chain necessary for prolonged walking activity.    Baseline see flowsheet    Time 8    Period Weeks    Status  New    Target Date 07/24/20      PT LONG TERM GOAL #3   Title Patient will self report ability to ascend flight of stairs in her home without difficulty.    Baseline Patient reports a lot of difficulty with ascending stairs at home.    Time 8    Period Weeks    Status New    Target Date 07/24/20      PT LONG TERM GOAL #4   Title Patient will self-report ability to walk for at least 2 miles with less than 2/10 low back pain.    Baseline 8/10 pain during and after walking 1 mile    Time 8    Period Weeks    Status New    Target Date 07/24/20                 Plan - 07/02/20 1353    Clinical Impression Statement Patient tolerated session well today with progression of core and hip strengthening. She reported minor low back discomfort during first set of dead bug core exercise, though when instructed to maintain transverse abdominis activation during movement, patient reported no discomfort during second set. Patient initially demonstrates excessive trunk flexion during hip hinge with ability to properly perform with cueing and continued practice. Able to re-introduce standing hip/core strengthening since patient sustained a fall 2 weeks ago with patient challenged in engaging core activation during standing 3 way hip as noted through difficulty maintaining lumbopelvic stability.    PT Treatment/Interventions ADLs/Self Care Home Management;Cryotherapy;Dentist;Therapeutic activities;Therapeutic exercise;Balance training;Neuromuscular re-education;Patient/family education;Manual techniques;Dry needling;Taping    PT Next Visit Plan updated HEP. squat as tolerated. consider core stablization on stability ball.    PT Home Exercise Plan Access Code: [email or web address]    Consulted and Agree with Plan of Care Patient           Patient will benefit from skilled therapeutic intervention in order to improve the following deficits and impairments:   Decreased range of motion,Difficulty walking,Decreased endurance,Decreased activity tolerance,Pain,Impaired flexibility,Decreased strength,Postural dysfunction  Visit Diagnosis: Muscle weakness (generalized)  Chronic bilateral low back pain, unspecified whether sciatica present  Problem List Patient Active Problem List   Diagnosis Date Noted  . Hallux rigidus of left foot 10/19/2019  . GERD (gastroesophageal reflux disease) 01/26/2019  . Constipation 01/26/2019  . MDD (major depressive disorder) 11/12/2018  . Osteoarthritis 02/08/2018  . 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 Allergy 02/08/2018  . Onychomycosis 02/08/2018  . Cat bite   . 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 Tenosynovitis of left foot   . Foot abscess, left 10/09/2017  . Eczema 07/24/2014   Gwendolyn Grant, PT, DPT, ATC 07/02/20 2:19 PM  [facility] [address] [place], [place], [address] Phone: [phone]   Fax:  [phone]  Name: MARLET KORTE MRN: [id number] Date of Birth: Nov 20, 1955

## 2020-07-04 ENCOUNTER — Ambulatory Visit: Payer: BC Managed Care – PPO

## 2020-07-04 ENCOUNTER — Other Ambulatory Visit: Payer: Self-pay

## 2020-07-04 DIAGNOSIS — M6281 Muscle weakness (generalized): Secondary | ICD-10-CM

## 2020-07-04 DIAGNOSIS — M545 Low back pain, unspecified: Secondary | ICD-10-CM | POA: Diagnosis not present

## 2020-07-04 DIAGNOSIS — G8929 Other chronic pain: Secondary | ICD-10-CM

## 2020-07-04 NOTE — Therapy (Signed)
[place], [place], [address] Phone: [phone]   Fax:  [phone]  Physical Therapy Treatment  Patient Details  Name: Madison Bryant MRN: [id number] Date of Birth: 1955-10-28 Referring Provider (PT): Buelah Manis, Modena Nunnery, MD   Encounter Date: 07/04/2020   PT End of Session - 07/04/20 1339    Visit Number 9    Number of Visits 17    Date for PT Re-Evaluation 07/28/20    PT Start Time 8032    PT Stop Time 1415    PT Time Calculation (min) 40 min    Activity Tolerance Patient tolerated treatment well    Behavior During Therapy [facility] for tasks assessed/performed           Past Medical History:  Diagnosis Date  . Allergy   . Ankle fracture    left  . Arthritis    fingers, knees and hips  . Asthma   . Cellulitis    left foot  . GERD (gastroesophageal reflux disease)    OTC med prn  . Osteoporosis   . PONV (postoperative nausea and vomiting)   . Wears glasses     Past Surgical History:  Procedure Laterality Date  . ABDOMINAL HYSTERECTOMY    . I & D EXTREMITY Left 10/09/2017   Procedure: IRRIGATION AND DEBRIDEMENT LEFT FOOT;  Surgeon: Leandrew Koyanagi, MD;  Location: [place];  Service: Orthopedics;  Laterality: Left;  . ORIF FIBULA FRACTURE Left 08/21/2016   Procedure: OPEN REDUCTION INTERNAL FIXATION (ORIF) DISTAL FIBULA FRACTURE;  Surgeon: Ninetta Lights, MD;  Location: [place];  Service: Orthopedics;  Laterality: Left;  . ROTATOR CUFF REPAIR Right   . TONSILLECTOMY    . TUBAL LIGATION      There were no vitals filed for this visit.   Subjective Assessment - 07/04/20 1337    Subjective Patient reports she felt worn out after last session, but overall feeling good. She denies any back pain and the knee is getting better.    Pertinent History osteoporosis, major depressive disorder, ORIF Lt distal fibula fracture 2018    Currently in Pain? Yes    Pain Score 1     Pain  Location Knee    Pain Orientation Right    Pain Descriptors / Indicators Nagging    Pain Type Acute pain    Pain Onset 1 to 4 weeks ago                             [facility] Adult PT Treatment/Exercise - 07/04/20 0001      Self-Care   Other Self-Care Comments  see patient education      Lumbar Exercises: Stretches   Hip Flexor Stretch 60 seconds    Hip Flexor Stretch Limitations right      Lumbar Exercises: Aerobic   Nustep L6 x 5 min LE/UE      Lumbar Exercises: Standing   Other Standing Lumbar Exercises mini squat with UE support 2 x 10; hip hinge with dowel 2 x 10    Other Standing Lumbar Exercises 3 way hip 2 x 10      Lumbar Exercises: Seated   Other Seated Lumbar Exercises pelvic tilts 2 x 20      Lumbar Exercises: Supine   Dead Bug 10 reps    Dead Bug Limitations x2    Single Leg Bridge 10 reps  PT Education - 07/04/20 1423    Education Details Updated HEP.    Person(s) Educated Patient    Methods Explanation;Demonstration;Verbal cues;Handout    Comprehension Verbalized understanding;Returned demonstration;Verbal cues required;Need further instruction            PT Short Term Goals - 07/02/20 1356      PT SHORT TERM GOAL #1   Title Patient will be independent and compliant with initial HEP.    Baseline patient reports compliance with HEP.    Status Achieved      PT SHORT TERM GOAL #2   Title Patient will self-report tolerating 30 minutes of continuous standing activity at home with less than 3/10 low back pain.    Baseline patient reports ability to wash dishes and fold laundry for 30 minutes without back pain    Status Achieved             PT Long Term Goals - 05/29/20 1538      PT LONG TERM GOAL #1   Title Patient will score at least 66% function on FOTO to signify clinically meaningful improvement in functional abilities.    Baseline 53% function    Time 8    Period Weeks    Status New    Target Date  07/24/20      PT LONG TERM GOAL #2   Title Patient will demonstrate at least 4+/5 strength in bilateral hips to improve stability about the chain necessary for prolonged walking activity.    Baseline see flowsheet    Time 8    Period Weeks    Status New    Target Date 07/24/20      PT LONG TERM GOAL #3   Title Patient will self report ability to ascend flight of stairs in her home without difficulty.    Baseline Patient reports a lot of difficulty with ascending stairs at home.    Time 8    Period Weeks    Status New    Target Date 07/24/20      PT LONG TERM GOAL #4   Title Patient will self-report ability to walk for at least 2 miles with less than 2/10 low back pain.    Baseline 8/10 pain during and after walking 1 mile    Time 8    Period Weeks    Status New    Target Date 07/24/20                 Plan - 07/04/20 1339    Clinical Impression Statement Patient tolerated session well today without reports of low back pain and no increased knee pain. Patient was issued advanced HEP requiring cues for appropriate posture during standing exercises as well as proper muscle sequencing for hip hinge with intermittent ability to correct. Patient was instructed to work on exercises independently next week and re-introduce household activity that was initially causing back pain (as able with acute knee pain) with plans to return to PT the following week to assess overall indepedence and tolerance to advanced HEP/household activity.    PT Treatment/Interventions ADLs/Self Care Home Management;Cryotherapy;Dentist;Therapeutic activities;Therapeutic exercise;Balance training;Neuromuscular re-education;Patient/family education;Manual techniques;Dry needling;Taping    PT Next Visit Plan potentially discharge. review HEP. assess response to household activity    PT Home Exercise Plan Access Code: [email or web address]    Consulted and Agree with Plan  of Care Patient           Patient will benefit from skilled therapeutic intervention  in order to improve the following deficits and impairments:  Decreased range of motion,Difficulty walking,Decreased endurance,Decreased activity tolerance,Pain,Impaired flexibility,Decreased strength,Postural dysfunction  Visit Diagnosis: Muscle weakness (generalized)  Chronic bilateral low back pain, unspecified whether sciatica present     Problem List Patient Active Problem List   Diagnosis Date Noted  . Hallux rigidus of left foot 10/19/2019  . GERD (gastroesophageal reflux disease) 01/26/2019  . Constipation 01/26/2019  . MDD (major depressive disorder) 11/12/2018  . Osteoarthritis 02/08/2018  . 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 Allergy 02/08/2018  . Onychomycosis 02/08/2018  . Cat bite   . 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 Tenosynovitis of left foot   . Foot abscess, left 10/09/2017  . Eczema 07/24/2014   Gwendolyn Grant, PT, DPT, ATC 07/04/20 2:26 PM  [facility] [address] [place], [place], [address] Phone: [phone]   Fax:  [phone]  Name: Madison Bryant MRN: [id number] Date of Birth: 1955/06/22

## 2020-07-05 ENCOUNTER — Encounter: Payer: Self-pay | Admitting: Family Medicine

## 2020-07-05 ENCOUNTER — Ambulatory Visit (INDEPENDENT_AMBULATORY_CARE_PROVIDER_SITE_OTHER): Payer: BC Managed Care – PPO | Admitting: Family Medicine

## 2020-07-05 VITALS — BP 120/84 | HR 81 | Temp 97.2°F | Ht 66.0 in | Wt 189.0 lb

## 2020-07-05 DIAGNOSIS — J069 Acute upper respiratory infection, unspecified: Secondary | ICD-10-CM | POA: Diagnosis not present

## 2020-07-05 MED ORDER — DOXYCYCLINE HYCLATE 100 MG PO TABS: 100.0000 mg | ORAL_TABLET | Freq: Two times a day (BID) | ORAL | 0 refills | Status: DC

## 2020-07-05 NOTE — Progress Notes (Signed)
Subjective:    Patient ID: Madison Bryant, female    DOB: 12/12/1955, 65 y.o.   MRN: [id number]  HPI Patient is unvaccinated.  She has not had COVID to her knowledge.  Recently her husband became sick.  Symptoms included sinus pressure, sinus pain, coughing, head congestion.  I spoke to him yes last week on a telephone visit.  He is currently taking a Z-Pak.  He was already on Cefzil for rosacea.  Despite taking these antibiotics, his symptoms have not worsened or improved.  He continues to have head congestion and coughing.  Patient reports a 4-day history of chest congestion and coughing.  Cough is productive of green sputum.  She denies any chest pain or shortness of breath.  She denies any fever or chills.  She does have some head congestion and rhinorrhea Past Medical History:  Diagnosis Date  . Allergy   . Ankle fracture    left  . Arthritis    fingers, knees and hips  . Asthma   . Cellulitis    left foot  . GERD (gastroesophageal reflux disease)    OTC med prn  . Osteoporosis   . PONV (postoperative nausea and vomiting)   . Wears glasses    Past Surgical History:  Procedure Laterality Date  . ABDOMINAL HYSTERECTOMY    . I & D EXTREMITY Left 10/09/2017   Procedure: IRRIGATION AND DEBRIDEMENT LEFT FOOT;  Surgeon: Leandrew Koyanagi, MD;  Location: [place];  Service: Orthopedics;  Laterality: Left;  . ORIF FIBULA FRACTURE Left 08/21/2016   Procedure: OPEN REDUCTION INTERNAL FIXATION (ORIF) DISTAL FIBULA FRACTURE;  Surgeon: Ninetta Lights, MD;  Location: [place];  Service: Orthopedics;  Laterality: Left;  . ROTATOR CUFF REPAIR Right   . TONSILLECTOMY    . TUBAL LIGATION     Current Outpatient Medications on File Prior to Visit  Medication Sig Dispense Refill  . cetirizine (ZYRTEC) 10 MG tablet Take 10 mg by mouth daily.    . Cholecalciferol (VITAMIN D3 PO) Take by mouth.    . Cyanocobalamin (B-12) 1000 MCG TABS Take by mouth.    . cyclobenzaprine (FLEXERIL) 10  MG tablet Take 1 tablet (10 mg total) by mouth 3 (three) times daily as needed for muscle spasms. 60 tablet 0  . diclofenac (VOLTAREN) 75 MG EC tablet Take 1 tablet (75 mg total) by mouth 2 (two) times daily. 60 tablet 1  . diclofenac sodium (VOLTAREN) 1 % GEL Apply 2 g topically 4 (four) times daily. 100 g 1  . escitalopram (LEXAPRO) 20 MG tablet Take 1 tablet (20 mg total) by mouth daily. 90 tablet 3  . Multiple Vitamin (MULTIVITAMIN WITH MINERALS) TABS tablet Take 1 tablet by mouth daily.    . Multiple Vitamins-Minerals (OCUVITE PRESERVISION PO) Take by mouth.    . TURMERIC PO Take by mouth.    . vitamin C (ASCORBIC ACID) 250 MG tablet Take 250 mg by mouth daily.    . methylPREDNISolone (MEDROL DOSEPAK) 4 MG TBPK tablet Take as directed on package (Patient not taking: Reported on 07/05/2020) 21 tablet 0   No current facility-administered medications on file prior to visit.   Allergies  Allergen Reactions  . Erythromycin Nausea And Vomiting and Other (See Comments)    GI UPSET   Social History   Socioeconomic History  . Marital status: Married    Spouse name: Not on file  . Number of children: Not on file  . Years of education:  Not on file  . Highest education level: Not on file  Occupational History  . Not on file  Tobacco Use  . Smoking status: Never Smoker  . Smokeless tobacco: Never Used  Vaping Use  . Vaping Use: Never used  Substance and Sexual Activity  . Alcohol use: No  . Drug use: No  . Sexual activity: Yes  Other Topics Concern  . Not on file  Social History Narrative  . Not on file   Social Determinants of Health   Financial Resource Strain: Not on file  Food Insecurity: Not on file  Transportation Needs: Not on file  Physical Activity: Not on file  Stress: Not on file  Social Connections: Not on file  Intimate Partner Violence: Not on file      Review of Systems  All other systems reviewed and are negative.      Objective:   Physical  Exam Constitutional:      General: She is not in acute distress.    Appearance: Normal appearance. She is not ill-appearing, toxic-appearing or diaphoretic.  HENT:     Head: Normocephalic and atraumatic.     Right Ear: Tympanic membrane and ear canal normal.     Left Ear: Tympanic membrane and ear canal normal.     Nose: Congestion and rhinorrhea present.  Eyes:     Extraocular Movements: Extraocular movements intact.     Conjunctiva/sclera: Conjunctivae normal.     Pupils: Pupils are equal, round, and reactive to light.  Cardiovascular:     Rate and Rhythm: Normal rate and regular rhythm.     Heart sounds: Normal heart sounds.  Pulmonary:     Effort: Pulmonary effort is normal. No respiratory distress.     Breath sounds: Normal breath sounds. No stridor. No wheezing.  Musculoskeletal:     Cervical back: Neck supple.  Lymphadenopathy:     Cervical: No cervical adenopathy.  Skin:    Coloration: Skin is not jaundiced.     Findings: No erythema.  Neurological:     General: No focal deficit present.     Mental Status: She is alert and oriented to person, place, and time. Mental status is at baseline.     Cranial Nerves: No cranial nerve deficit.     Motor: No weakness.     Coordination: Coordination normal.     Gait: Gait normal.  Psychiatric:        Mood and Affect: Mood normal.        Behavior: Behavior normal.        Thought Content: Thought content normal.        Judgment: Judgment normal.           Assessment & Plan:  Viral upper respiratory tract infection - Plan: SARS-COV-2 RNA,(COVID-19) QUAL NAAT  All signs point to a viral upper respiratory infection.  Recommended tincture of time and symptomatic relief only.  Can use cough medication and decongestants as necessary.  Also tested the patient for Covid.  If symptoms worsen she can start taking doxycycline 100 mg p.o. twice daily for 7 days for bronchitis

## 2020-07-06 LAB — SARS-COV-2 RNA,(COVID-19) QUALITATIVE NAAT: SARS CoV2 RNA: NOT DETECTED

## 2020-07-10 ENCOUNTER — Encounter: Payer: Self-pay | Admitting: Physical Therapy

## 2020-07-13 ENCOUNTER — Ambulatory Visit: Payer: BC Managed Care – PPO | Admitting: Physical Therapy

## 2020-07-16 ENCOUNTER — Ambulatory Visit: Payer: BC Managed Care – PPO

## 2020-07-18 ENCOUNTER — Ambulatory Visit: Payer: BC Managed Care – PPO

## 2020-07-26 ENCOUNTER — Other Ambulatory Visit: Payer: Self-pay

## 2020-07-26 ENCOUNTER — Ambulatory Visit: Payer: BC Managed Care – PPO | Attending: Family Medicine

## 2020-07-26 DIAGNOSIS — G8929 Other chronic pain: Secondary | ICD-10-CM | POA: Diagnosis present

## 2020-07-26 DIAGNOSIS — M6281 Muscle weakness (generalized): Secondary | ICD-10-CM | POA: Diagnosis present

## 2020-07-26 DIAGNOSIS — M545 Low back pain, unspecified: Secondary | ICD-10-CM | POA: Diagnosis present

## 2020-07-26 NOTE — Therapy (Signed)
[place], [place], [address] Phone: [phone]   Fax:  [phone]  Physical Therapy Treatment/Discharge   Patient Details  Name: Madison Bryant MRN: [id number] Date of Birth: [birth year] Referring Provider (PT): Buelah Manis, Modena Nunnery, MD   Encounter Date: 07/26/2020   PT End of Session - 07/26/20 1532    Visit Number 10    Number of Visits 17    Date for PT Re-Evaluation 07/28/20    PT Start Time 6962    PT Stop Time 1619    PT Time Calculation (min) 44 min    Activity Tolerance Patient tolerated treatment well    Behavior During Therapy [facility] for tasks assessed/performed           Past Medical History:  Diagnosis Date  . Allergy   . Ankle fracture    left  . Arthritis    fingers, knees and hips  . Asthma   . Cellulitis    left foot  . GERD (gastroesophageal reflux disease)    OTC med prn  . Osteoporosis   . PONV (postoperative nausea and vomiting)   . Wears glasses     Past Surgical History:  Procedure Laterality Date  . ABDOMINAL HYSTERECTOMY    . I & D EXTREMITY Left 10/09/2017   Procedure: IRRIGATION AND DEBRIDEMENT LEFT FOOT;  Surgeon: Leandrew Koyanagi, MD;  Location: [place];  Service: Orthopedics;  Laterality: Left;  . ORIF FIBULA FRACTURE Left 08/21/2016   Procedure: OPEN REDUCTION INTERNAL FIXATION (ORIF) DISTAL FIBULA FRACTURE;  Surgeon: Ninetta Lights, MD;  Location: [place];  Service: Orthopedics;  Laterality: Left;  . ROTATOR CUFF REPAIR Right   . TONSILLECTOMY    . TUBAL LIGATION      There were no vitals filed for this visit.   Subjective Assessment - 07/26/20 1537    Subjective patient reports she had to reschedule previous visits due to stomach illness. Patient reports she has gotten back into her exercises after her illness and has been able to complete household activities without back pain.    Pertinent History osteoporosis, major depressive disorder,  ORIF Lt distal fibula fracture 2018    Currently in Pain? No/denies    Pain Onset --              [facility] PT Assessment - 07/26/20 0001      Observation/Other Assessments   Focus on Therapeutic Outcomes (FOTO)  60% function      AROM   Overall AROM Comments Lumbar AROM WFL and pain free      Strength   Right Hip Flexion 5/5    Right Hip Extension 5/5    Right Hip ABduction 4+/5    Left Hip Flexion 4+/5    Left Hip Extension 5/5    Left Hip ABduction 4+/5    Right Knee Flexion 5/5    Right Knee Extension 5/5    Left Knee Flexion 5/5    Left Knee Extension 5/5    Right Ankle Dorsiflexion 5/5    Right Ankle Plantar Flexion 5/5    Left Ankle Dorsiflexion 5/5    Left Ankle Plantar Flexion 5/5      Flexibility   Hamstrings WNL    Quadriceps WNL      Palpation   Palpation comment no palpable tenderness about lumbar or hip region  [place] [facility] PT Treatment/Exercise - 07/26/20 0001      Self-Care   Other Self-Care Comments  see patient education      Lumbar Exercises: Aerobic   Nustep L6 x 5 min LE/UE      Lumbar Exercises: Standing   Other Standing Lumbar Exercises mini squats 2 x 10; hip hinge with dowel 2 x 10    Other Standing Lumbar Exercises 3 way hip with red theraband 2  x10      Lumbar Exercises: Seated   Other Seated Lumbar Exercises pelvic tilts 2 x 20      Lumbar Exercises: Supine   Dead Bug 10 reps    Dead Bug Limitations x 2      Lumbar Exercises: Prone   Straight Leg Raise 10 reps    Straight Leg Raises Limitations x 2                  PT Education - 07/26/20 1600    Education Details Education on re-assessment findings. Discharge education. Review of HEP. posture education    Person(s) Educated Patient    Methods Explanation;Verbal cues;Demonstration;Handout    Comprehension Verbalized understanding;Returned demonstration            PT Short Term Goals - 07/02/20 1356      PT SHORT TERM  GOAL #1   Title Patient will be independent and compliant with initial HEP.    Baseline patient reports compliance with HEP.    Status Achieved      PT SHORT TERM GOAL #2   Title Patient will self-report tolerating 30 minutes of continuous standing activity at home with less than 3/10 low back pain.    Baseline patient reports ability to wash dishes and fold laundry for 30 minutes without back pain    Status Achieved             PT Long Term Goals - 07/26/20 1545      PT LONG TERM GOAL #1   Title Patient will score at least 66% function on FOTO to signify clinically meaningful improvement in functional abilities.    Baseline 60%    Time 8    Period Weeks    Status On-going      PT LONG TERM GOAL #2   Title Patient will demonstrate at least 4+/5 strength in bilateral hips to improve stability about the chain necessary for prolonged walking activity.    Baseline see flowsheet    Time 8    Period Weeks    Status Achieved      PT LONG TERM GOAL #3   Title Patient will self report ability to ascend flight of stairs in her home without difficulty.    Baseline patient reports it is much better going up and down the stairs, no back pain    Time 8    Period Weeks    Status Achieved      PT LONG TERM GOAL #4   Title Patient will self-report ability to walk for at least 2 miles with less than 2/10 low back pain.    Baseline able to walk 3/4 mile without onset of pain; hasn't progressed to 2 miles due other factors not related to back pain.    Time 8    Period Weeks    Status On-going                 Plan - 07/26/20 1554    Clinical Impression Statement Patient has progressed  well throughout duration of care having met or partially met all established functional goals. She reports improved tolerance to household activities and recreational activities without reports of low back pain. She is pleased with her overall progress and is independent with her home progam. She is  therefore appropriate for discharge at this time.    PT Treatment/Interventions ADLs/Self Care Home Management;Cryotherapy;Dentist;Therapeutic activities;Therapeutic exercise;Balance training;Neuromuscular re-education;Patient/family education;Manual techniques;Dry needling;Taping    PT Home Exercise Plan Access Code: [email or web address]    Consulted and Agree with Plan of Care Patient           Patient will benefit from skilled therapeutic intervention in order to improve the following deficits and impairments:  Decreased range of motion,Difficulty walking,Decreased endurance,Decreased activity tolerance,Pain,Impaired flexibility,Decreased strength,Postural dysfunction  Visit Diagnosis: Muscle weakness (generalized)  Chronic bilateral low back pain, unspecified whether sciatica present     Problem List Patient Active Problem List   Diagnosis Date Noted  . Hallux rigidus of left foot 10/19/2019  . GERD (gastroesophageal reflux disease) 01/26/2019  . Constipation 01/26/2019  . MDD (major depressive disorder) 11/12/2018  . Osteoarthritis 02/08/2018  . 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 Allergy 02/08/2018  . Onychomycosis 02/08/2018  . Cat bite   . 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 Tenosynovitis of left foot   . Foot abscess, left 10/09/2017  . Eczema 07/24/2014   PHYSICAL THERAPY DISCHARGE SUMMARY  Visits from Start of Care: 10  Current functional level related to goals / functional outcomes: See above   Remaining deficits: N/A   Education / Equipment: See above   Plan: Patient agrees to discharge.  Patient goals were partially met. Patient is being discharged due to being pleased with the current functional level.  ?????            Gwendolyn Grant, PT, DPT, ATC 07/26/20 4:20 PM  [facility] [address] [place], [place], [address] Phone:  [phone]   Fax:  [phone]  Name: Madison Bryant MRN: [id number] Date of Birth: June 30, 1955

## 2020-07-26 NOTE — Patient Instructions (Signed)

## 2020-08-16 ENCOUNTER — Encounter: Payer: Self-pay | Admitting: Family Medicine

## 2020-08-16 ENCOUNTER — Ambulatory Visit (INDEPENDENT_AMBULATORY_CARE_PROVIDER_SITE_OTHER): Payer: BC Managed Care – PPO | Admitting: Family Medicine

## 2020-08-16 ENCOUNTER — Other Ambulatory Visit: Payer: Self-pay

## 2020-08-16 VITALS — BP 128/60 | HR 96 | Temp 98.9°F | Resp 14 | Ht 66.0 in | Wt 188.0 lb

## 2020-08-16 DIAGNOSIS — L309 Dermatitis, unspecified: Secondary | ICD-10-CM

## 2020-08-16 DIAGNOSIS — R198 Other specified symptoms and signs involving the digestive system and abdomen: Secondary | ICD-10-CM | POA: Diagnosis not present

## 2020-08-16 DIAGNOSIS — R0989 Other specified symptoms and signs involving the circulatory and respiratory systems: Secondary | ICD-10-CM

## 2020-08-16 MED ORDER — PANTOPRAZOLE SODIUM 40 MG PO TBEC: 40.0000 mg | DELAYED_RELEASE_TABLET | Freq: Every day | ORAL | 3 refills | Status: DC

## 2020-08-16 MED ORDER — TRIAMCINOLONE ACETONIDE 0.1 % EX CREA: 1.0000 "application " | TOPICAL_CREAM | Freq: Two times a day (BID) | CUTANEOUS | 0 refills | Status: DC

## 2020-08-16 NOTE — Progress Notes (Signed)
Subjective:    Patient ID: Madison Bryant, female    DOB: 1956/02/12, 65 y.o.   MRN: [id number]  HPI  Patient presents today complaining of itching in both ear canals.  I examined both ear canals and see no visible abnormalities.  There is no erythema or exudate or rash or lesions.  The patient states that she has been applying over-the-counter cortisone cream at night and this helps temporarily with itching but in the morning it starts over again.  There is no rash in her scalp.  Specifically there is no dandruff or erythema.  There is no evidence of psoriasis.  The itching is limited only to the ear canals.  Therefore differential diagnosis includes psoriasis, eczema, seborrheic dermatitis, neurogenic itching.  Given the isolated location, I suspect eczema brought on by moisture due to washing her hair and not allowing it to dry thoroughly.  She also reports a globus sensation in her throat for a few weeks.  She denies any odynophagia.  She denies any hematemesis.  She denies any food sticking.  She is under more stress. Past Medical History:  Diagnosis Date  . Allergy   . Ankle fracture    left  . Arthritis    fingers, knees and hips  . Asthma   . Cellulitis    left foot  . GERD (gastroesophageal reflux disease)    OTC med prn  . Osteoporosis   . PONV (postoperative nausea and vomiting)   . Wears glasses    Past Surgical History:  Procedure Laterality Date  . ABDOMINAL HYSTERECTOMY    . I & D EXTREMITY Left 10/09/2017   Procedure: IRRIGATION AND DEBRIDEMENT LEFT FOOT;  Surgeon: Leandrew Koyanagi, MD;  Location: [place];  Service: Orthopedics;  Laterality: Left;  . ORIF FIBULA FRACTURE Left 08/21/2016   Procedure: OPEN REDUCTION INTERNAL FIXATION (ORIF) DISTAL FIBULA FRACTURE;  Surgeon: Ninetta Lights, MD;  Location: [place];  Service: Orthopedics;  Laterality: Left;  . ROTATOR CUFF REPAIR Right   . TONSILLECTOMY    . TUBAL LIGATION     Current Outpatient  Medications on File Prior to Visit  Medication Sig Dispense Refill  . cetirizine (ZYRTEC) 10 MG tablet Take 10 mg by mouth daily.    . Cholecalciferol (VITAMIN D3 PO) Take by mouth.    . Cyanocobalamin (B-12) 1000 MCG TABS Take by mouth.    . cyclobenzaprine (FLEXERIL) 10 MG tablet Take 1 tablet (10 mg total) by mouth 3 (three) times daily as needed for muscle spasms. 60 tablet 0  . diclofenac (VOLTAREN) 75 MG EC tablet Take 1 tablet (75 mg total) by mouth 2 (two) times daily. 60 tablet 1  . diclofenac sodium (VOLTAREN) 1 % GEL Apply 2 g topically 4 (four) times daily. 100 g 1  . doxycycline (VIBRA-TABS) 100 MG tablet Take 1 tablet (100 mg total) by mouth 2 (two) times daily. 20 tablet 0  . escitalopram (LEXAPRO) 20 MG tablet Take 1 tablet (20 mg total) by mouth daily. 90 tablet 3  . methylPREDNISolone (MEDROL DOSEPAK) 4 MG TBPK tablet Take as directed on package (Patient not taking: Reported on 07/05/2020) 21 tablet 0  . Multiple Vitamin (MULTIVITAMIN WITH MINERALS) TABS tablet Take 1 tablet by mouth daily.    . Multiple Vitamins-Minerals (OCUVITE PRESERVISION PO) Take by mouth.    . TURMERIC PO Take by mouth.    . vitamin C (ASCORBIC ACID) 250 MG tablet Take 250 mg by mouth daily.  No current facility-administered medications on file prior to visit.   Allergies  Allergen Reactions  . Erythromycin Nausea And Vomiting and Other (See Comments)    GI UPSET   Social History   Socioeconomic History  . Marital status: Married    Spouse name: Not on file  . Number of children: Not on file  . Years of education: Not on file  . Highest education level: Not on file  Occupational History  . Not on file  Tobacco Use  . Smoking status: Never Smoker  . Smokeless tobacco: Never Used  Vaping Use  . Vaping Use: Never used  Substance and Sexual Activity  . Alcohol use: No  . Drug use: No  . Sexual activity: Yes  Other Topics Concern  . Not on file  Social History Narrative  . Not on  file   Social Determinants of Health   Financial Resource Strain: Not on file  Food Insecurity: Not on file  Transportation Needs: Not on file  Physical Activity: Not on file  Stress: Not on file  Social Connections: Not on file  Intimate Partner Violence: Not on file      Review of Systems  All other systems reviewed and are negative.      Objective:   Physical Exam Constitutional:      General: She is not in acute distress.    Appearance: Normal appearance. She is not ill-appearing, toxic-appearing or diaphoretic.  HENT:     Head: Normocephalic and atraumatic.     Right Ear: Tympanic membrane and ear canal normal.     Left Ear: Tympanic membrane and ear canal normal.  Cardiovascular:     Rate and Rhythm: Normal rate and regular rhythm.     Heart sounds: Normal heart sounds.  Pulmonary:     Effort: Pulmonary effort is normal. No respiratory distress.     Breath sounds: Normal breath sounds. No stridor. No wheezing.  Musculoskeletal:     Cervical back: Neck supple.  Lymphadenopathy:     Cervical: No cervical adenopathy.  Neurological:     Mental Status: She is alert.           Assessment & Plan:  Eczema, unspecified type  Globus sensation  I believe the situation in her ears is likely moisture from washing her hair that does not fully dry irritating the skin causing a mild eczema.  I recommended applying triamcinolone 1-2 times a day as needed for a week to calm down the itching.  I recommended that she thoroughly dry her ear canals with a hair dryer every time she washes her hair to see if this will help prevent the itching so that we can get away from using repeated steroids.  There are no visible abnormalities on exam.  I explained the globus sensation usually is associated with acid reflux.  Therefore I would like her to try Protonix 40 mg a day for 1 to 2 weeks to see if the symptoms start to improve.  If not, we can consult ENT for direct laryngoscopy.   Anxiety could also be a trigger however she does not feel that this is due to anxiety.

## 2020-10-17 DIAGNOSIS — F419 Anxiety disorder, unspecified: Secondary | ICD-10-CM | POA: Insufficient documentation

## 2020-11-05 DIAGNOSIS — J302 Other seasonal allergic rhinitis: Secondary | ICD-10-CM | POA: Insufficient documentation

## 2020-11-10 ENCOUNTER — Other Ambulatory Visit: Payer: Self-pay | Admitting: Family Medicine

## 2021-01-18 ENCOUNTER — Other Ambulatory Visit: Payer: Self-pay

## 2021-01-18 ENCOUNTER — Encounter: Payer: Self-pay | Admitting: Family Medicine

## 2021-01-18 ENCOUNTER — Ambulatory Visit: Payer: BC Managed Care – PPO | Admitting: Family Medicine

## 2021-01-18 VITALS — BP 128/68 | HR 100 | Temp 98.9°F | Resp 18 | Ht 66.0 in | Wt 185.0 lb

## 2021-01-18 DIAGNOSIS — L509 Urticaria, unspecified: Secondary | ICD-10-CM | POA: Diagnosis not present

## 2021-01-18 DIAGNOSIS — M436 Torticollis: Secondary | ICD-10-CM | POA: Diagnosis not present

## 2021-01-18 MED ORDER — PREDNISONE 20 MG PO TABS: ORAL_TABLET | ORAL | 0 refills | Status: DC

## 2021-01-18 MED ORDER — DIAZEPAM 10 MG PO TABS: 10.0000 mg | ORAL_TABLET | Freq: Three times a day (TID) | ORAL | 0 refills | Status: DC | PRN

## 2021-01-18 NOTE — Progress Notes (Signed)
Subjective:    Patient ID: Madison Bryant, female    DOB: April 08, 1956, 65 y.o.   MRN: [id number]  HPI  Patient states that she is under a tremendous amount of stress recently.  She is caring for her sister.  She has a lot of going on in her personal and professional life.  She is not resting well.  Recently she woke up with pain and stiffness in her trapezius muscle on both sides of her neck.  She reports pain with range of motion.  She is tender to palpation in that area.  Her neck feels stiff.  It hurts to turn her head side to side.  She denies any numbness or tingling radiating down her arms.  Pain is isolated to the trapezius muscle from her shoulders up.  She denies any headache.  She also has a large urticarial wheal on her left gluteus.  It extends from the gluteal cleft onto the left gluteus.  To the right of the gluteal cleft is an insect bite.  The large urticarial wheal is roughly 5 inches in diameter.  It is skin colored but raised and indurated and extremely itchy.  She has been scratching at it now for more than a week.  I believe it may be local allergic reaction to an insect bite Past Medical History:  Diagnosis Date   Allergy    Ankle fracture    left   Arthritis    fingers, knees and hips   Asthma    Cellulitis    left foot   GERD (gastroesophageal reflux disease)    OTC med prn   Osteoporosis    PONV (postoperative nausea and vomiting)    Wears glasses    Past Surgical History:  Procedure Laterality Date   ABDOMINAL HYSTERECTOMY     I & D EXTREMITY Left 10/09/2017   Procedure: IRRIGATION AND DEBRIDEMENT LEFT FOOT;  Surgeon: Leandrew Koyanagi, MD;  Location: [place];  Service: Orthopedics;  Laterality: Left;   ORIF FIBULA FRACTURE Left 08/21/2016   Procedure: OPEN REDUCTION INTERNAL FIXATION (ORIF) DISTAL FIBULA FRACTURE;  Surgeon: Ninetta Lights, MD;  Location: [place];  Service: Orthopedics;  Laterality: Left;   ROTATOR CUFF REPAIR Right     TONSILLECTOMY     TUBAL LIGATION     Current Outpatient Medications on File Prior to Visit  Medication Sig Dispense Refill   diclofenac sodium (VOLTAREN) 1 % GEL Apply 2 g topically 4 (four) times daily. 100 g 1   No current facility-administered medications on file prior to visit.   Allergies  Allergen Reactions   Erythromycin Nausea And Vomiting and Other (See Comments)    GI UPSET   Social History   Socioeconomic History   Marital status: Married    Spouse name: Not on file   Number of children: Not on file   Years of education: Not on file   Highest education level: Not on file  Occupational History   Not on file  Tobacco Use   Smoking status: Never   Smokeless tobacco: Never  Vaping Use   Vaping Use: Never used  Substance and Sexual Activity   Alcohol use: No   Drug use: No   Sexual activity: Yes  Other Topics Concern   Not on file  Social History Narrative   Not on file   Social Determinants of Health   Financial Resource Strain: Not on file  Food Insecurity: Not on file  Transportation Needs:  Not on file  Physical Activity: Not on file  Stress: Not on file  Social Connections: Not on file  Intimate Partner Violence: Not on file      Review of Systems  All other systems reviewed and are negative.     Objective:   Physical Exam Constitutional:      General: She is not in acute distress.    Appearance: Normal appearance. She is not ill-appearing, toxic-appearing or diaphoretic.  HENT:     Head: Normocephalic and atraumatic.  Cardiovascular:     Rate and Rhythm: Normal rate and regular rhythm.     Heart sounds: Normal heart sounds.  Pulmonary:     Effort: Pulmonary effort is normal. No respiratory distress.     Breath sounds: Normal breath sounds. No stridor. No wheezing.  Musculoskeletal:     Cervical back: Neck supple.  Lymphadenopathy:     Cervical: No cervical adenopathy.  Skin:    Findings: Rash present. Rash is urticarial.        Neurological:     Mental Status: She is alert.          Assessment & Plan:  Wry neck  Urticaria I believe the patient is likely having muscle spasms in her neck secondary to stress and muscle strain.  We will try Valium 5 to 10 mg p.o. every 8 hours as needed muscle spasms.  I believe that a few nights of taking Valium prior to bed will help relieve the muscle spasms in her neck.  I believe the plaque on her left gluteus is likely a localized allergic reaction to the insect bite seen on her right gluteus.  It looks like a large urticarial wheal.  We will treat this with prednisone taper pack in addition to Benadryl every 6 hours as needed.  Reassess next week if no better or sooner if worse

## 2021-01-29 ENCOUNTER — Encounter: Payer: Self-pay | Admitting: Family Medicine

## 2021-02-04 ENCOUNTER — Encounter: Payer: Self-pay | Admitting: Family Medicine

## 2021-02-04 ENCOUNTER — Other Ambulatory Visit: Payer: Self-pay

## 2021-02-04 ENCOUNTER — Ambulatory Visit: Payer: BC Managed Care – PPO | Admitting: Family Medicine

## 2021-02-04 VITALS — BP 128/66 | HR 76 | Temp 98.7°F | Resp 16 | Ht 66.0 in | Wt 189.0 lb

## 2021-02-04 DIAGNOSIS — L03317 Cellulitis of buttock: Secondary | ICD-10-CM

## 2021-02-04 MED ORDER — CEPHALEXIN 500 MG PO CAPS: 500.0000 mg | ORAL_CAPSULE | Freq: Three times a day (TID) | ORAL | 0 refills | Status: DC

## 2021-02-04 NOTE — Progress Notes (Signed)
Subjective:    Patient ID: Madison Bryant, female    DOB: 04-10-1956, 65 y.o.   MRN: [id number]  HPI  I recently saw the patient on September 9 and she had a large urticarial wheal on her left gluteus.  I treated that with a prednisone taper pack.  She states that the prednisone helped the itching however it is still very red hot and tender.  The skin there now appears to be more cellulitis.  The skin over the left gluteus is indurated erythematous warm and tender compared to the surrounding skin.  It is sharply demarcated.  This is despite taking prednisone and also using cortisone cream Past Medical History:  Diagnosis Date   Allergy    Ankle fracture    left   Arthritis    fingers, knees and hips   Asthma    Cellulitis    left foot   GERD (gastroesophageal reflux disease)    OTC med prn   Osteoporosis    PONV (postoperative nausea and vomiting)    Wears glasses    Past Surgical History:  Procedure Laterality Date   ABDOMINAL HYSTERECTOMY     I & D EXTREMITY Left 10/09/2017   Procedure: IRRIGATION AND DEBRIDEMENT LEFT FOOT;  Surgeon: Leandrew Koyanagi, MD;  Location: [place];  Service: Orthopedics;  Laterality: Left;   ORIF FIBULA FRACTURE Left 08/21/2016   Procedure: OPEN REDUCTION INTERNAL FIXATION (ORIF) DISTAL FIBULA FRACTURE;  Surgeon: Ninetta Lights, MD;  Location: [place];  Service: Orthopedics;  Laterality: Left;   ROTATOR CUFF REPAIR Right    TONSILLECTOMY     TUBAL LIGATION     Current Outpatient Medications on File Prior to Visit  Medication Sig Dispense Refill   diazepam (VALIUM) 10 MG tablet Take 1 tablet (10 mg total) by mouth every 8 (eight) hours as needed (muscle spasms). 20 tablet 0   diclofenac sodium (VOLTAREN) 1 % GEL Apply 2 g topically 4 (four) times daily. 100 g 1   No current facility-administered medications on file prior to visit.   Allergies  Allergen Reactions   Erythromycin Nausea And Vomiting and Other (See Comments)    GI  UPSET   Social History   Socioeconomic History   Marital status: Married    Spouse name: Not on file   Number of children: Not on file   Years of education: Not on file   Highest education level: Not on file  Occupational History   Not on file  Tobacco Use   Smoking status: Never   Smokeless tobacco: Never  Vaping Use   Vaping Use: Never used  Substance and Sexual Activity   Alcohol use: No   Drug use: No   Sexual activity: Yes  Other Topics Concern   Not on file  Social History Narrative   Not on file   Social Determinants of Health   Financial Resource Strain: Not on file  Food Insecurity: Not on file  Transportation Needs: Not on file  Physical Activity: Not on file  Stress: Not on file  Social Connections: Not on file  Intimate Partner Violence: Not on file      Review of Systems  All other systems reviewed and are negative.     Objective:   Physical Exam Constitutional:      General: She is not in acute distress.    Appearance: Normal appearance. She is not ill-appearing, toxic-appearing or diaphoretic.  HENT:     Head: Normocephalic  and atraumatic.  Cardiovascular:     Rate and Rhythm: Normal rate and regular rhythm.     Heart sounds: Normal heart sounds.  Pulmonary:     Effort: Pulmonary effort is normal. No respiratory distress.     Breath sounds: Normal breath sounds. No stridor. No wheezing.  Musculoskeletal:     Cervical back: Neck supple.  Lymphadenopathy:     Cervical: No cervical adenopathy.  Skin:    Findings: Erythema present.       Neurological:     Mental Status: She is alert.          Assessment & Plan:  Cellulitis of buttock I believe the patient has developed a secondary cellulitis possibly due to scratching.  Begin Keflex 500 mg p.o. 3 times daily for 7 days and reassess later this week or sooner if worsening

## 2021-04-12 ENCOUNTER — Encounter: Payer: Self-pay | Admitting: Family Medicine

## 2021-04-17 ENCOUNTER — Encounter: Payer: Self-pay | Admitting: Family Medicine

## 2021-04-26 ENCOUNTER — Encounter: Payer: Self-pay | Admitting: Family Medicine

## 2021-04-29 ENCOUNTER — Other Ambulatory Visit: Payer: Self-pay | Admitting: Family Medicine

## 2021-04-29 DIAGNOSIS — Z0000 Encounter for general adult medical examination without abnormal findings: Secondary | ICD-10-CM

## 2021-04-29 DIAGNOSIS — Z13220 Encounter for screening for lipoid disorders: Secondary | ICD-10-CM

## 2021-04-29 DIAGNOSIS — R5383 Other fatigue: Secondary | ICD-10-CM

## 2021-04-29 DIAGNOSIS — L659 Nonscarring hair loss, unspecified: Secondary | ICD-10-CM

## 2021-04-29 DIAGNOSIS — Z8639 Personal history of other endocrine, nutritional and metabolic disease: Secondary | ICD-10-CM

## 2021-05-07 ENCOUNTER — Other Ambulatory Visit: Payer: BC Managed Care – PPO

## 2021-05-07 ENCOUNTER — Other Ambulatory Visit: Payer: Self-pay

## 2021-05-07 DIAGNOSIS — R5383 Other fatigue: Secondary | ICD-10-CM

## 2021-05-07 DIAGNOSIS — Z8639 Personal history of other endocrine, nutritional and metabolic disease: Secondary | ICD-10-CM

## 2021-05-07 DIAGNOSIS — Z0000 Encounter for general adult medical examination without abnormal findings: Secondary | ICD-10-CM

## 2021-05-07 DIAGNOSIS — L659 Nonscarring hair loss, unspecified: Secondary | ICD-10-CM

## 2021-05-07 DIAGNOSIS — Z13220 Encounter for screening for lipoid disorders: Secondary | ICD-10-CM

## 2021-05-08 LAB — COMPLETE METABOLIC PANEL WITH GFR
AG Ratio: 1.6 (calc) (ref 1.0–2.5)
ALT: 18 U/L (ref 6–29)
AST: 16 U/L (ref 10–35)
Albumin: 4.1 g/dL (ref 3.6–5.1)
Alkaline phosphatase (APISO): 75 U/L (ref 37–153)
BUN: 10 mg/dL (ref 7–25)
CO2: 29 mmol/L (ref 20–32)
Calcium: 9.1 mg/dL (ref 8.6–10.4)
Chloride: 107 mmol/L (ref 98–110)
Creat: 0.79 mg/dL (ref 0.50–1.05)
Globulin: 2.5 g/dL (calc) (ref 1.9–3.7)
Glucose, Bld: 99 mg/dL (ref 65–99)
Potassium: 4.7 mmol/L (ref 3.5–5.3)
Sodium: 143 mmol/L (ref 135–146)
Total Bilirubin: 0.7 mg/dL (ref 0.2–1.2)
Total Protein: 6.6 g/dL (ref 6.1–8.1)
eGFR: 84 mL/min/{1.73_m2} (ref >=60)

## 2021-05-08 LAB — CBC WITH DIFFERENTIAL/PLATELET
Absolute Monocytes: 400 cells/uL (ref 200–950)
Basophils Absolute: 40 cells/uL (ref 0–200)
Basophils Relative: 1 %
Eosinophils Absolute: 160 cells/uL (ref 15–500)
Eosinophils Relative: 4 %
HCT: 38.6 % (ref 35.0–45.0)
Hemoglobin: 12.9 g/dL (ref 11.7–15.5)
Lymphs Abs: 1264 cells/uL (ref 850–3900)
MCH: 29.9 pg (ref 27.0–33.0)
MCHC: 33.4 g/dL (ref 32.0–36.0)
MCV: 89.4 fL (ref 80.0–100.0)
MPV: 8.7 fL (ref 7.5–12.5)
Monocytes Relative: 10 %
Neutro Abs: 2136 cells/uL (ref 1500–7800)
Neutrophils Relative %: 53.4 %
Platelets: 298 10*3/uL (ref 140–400)
RBC: 4.32 10*6/uL (ref 3.80–5.10)
RDW: 11.9 % (ref 11.0–15.0)
Total Lymphocyte: 31.6 %
WBC: 4 10*3/uL (ref 3.8–10.8)

## 2021-05-08 LAB — LIPID PANEL
Cholesterol: 207 mg/dL — ABNORMAL HIGH (ref <200)
HDL: 60 mg/dL (ref >=50)
LDL Cholesterol (Calc): 122 mg/dL (calc) — ABNORMAL HIGH
Non-HDL Cholesterol (Calc): 147 mg/dL (calc) — ABNORMAL HIGH (ref <130)
Total CHOL/HDL Ratio: 3.5 (calc) (ref <5.0)
Triglycerides: 137 mg/dL (ref <150)

## 2021-05-08 LAB — VITAMIN B12: Vitamin B-12: 278 pg/mL (ref 200–1100)

## 2021-05-08 LAB — VITAMIN D 25 HYDROXY (VIT D DEFICIENCY, FRACTURES): Vit D, 25-Hydroxy: 34 ng/mL (ref 30–100)

## 2021-05-08 LAB — TSH: TSH: 2.59 mIU/L (ref 0.40–4.50)

## 2021-05-10 ENCOUNTER — Encounter: Payer: Self-pay | Admitting: Family Medicine

## 2021-05-10 ENCOUNTER — Ambulatory Visit (INDEPENDENT_AMBULATORY_CARE_PROVIDER_SITE_OTHER): Payer: BC Managed Care – PPO | Admitting: Family Medicine

## 2021-05-10 ENCOUNTER — Other Ambulatory Visit: Payer: Self-pay

## 2021-05-10 VITALS — BP 116/80 | HR 115 | Temp 97.7°F | Resp 18 | Ht 66.0 in | Wt 185.0 lb

## 2021-05-10 DIAGNOSIS — Z0000 Encounter for general adult medical examination without abnormal findings: Secondary | ICD-10-CM | POA: Diagnosis not present

## 2021-05-10 DIAGNOSIS — Z13220 Encounter for screening for lipoid disorders: Secondary | ICD-10-CM

## 2021-05-10 DIAGNOSIS — Z8639 Personal history of other endocrine, nutritional and metabolic disease: Secondary | ICD-10-CM

## 2021-05-10 DIAGNOSIS — Z1211 Encounter for screening for malignant neoplasm of colon: Secondary | ICD-10-CM

## 2021-05-10 NOTE — Progress Notes (Signed)
Subjective:    Patient ID: Madison Bryant, female    DOB: 1955-05-22, 64 y.o.   MRN: [id number]  HPI  Patient is here today for complete physical exam.  She had a mammogram in December and it was normal.  She has not had a bone density test however she denies any family history of osteoporosis and therefore she would not be due again until 34.  She has had a colonoscopy within 52 and this is overdue.  She has a history of a hysterectomy and therefore does not require Pap smear.  She is due for the flu shot as well as the shingles vaccine.  We also discussed the COVID booster.  Other than that she is doing well.  She has experienced some thinning of the hair in the center of her scalp.  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  We did check a thyroid test that was normal.  She has recently started testosterone therapy under the care of her gynecologist and this may be playing a slight role. Appointment on 05/07/2021  Component Date Value Ref Range Status   WBC 05/07/2021 4.0  3.8 - 10.8 Thousand/uL Final   RBC 05/07/2021 4.32  3.80 - 5.10 Million/uL Final   Hemoglobin 05/07/2021 12.9  11.7 - 15.5 g/dL Final   HCT 05/07/2021 38.6  35.0 - 45.0 % Final   MCV 05/07/2021 89.4  80.0 - 100.0 fL Final   MCH 05/07/2021 29.9  27.0 - 33.0 pg Final   MCHC 05/07/2021 33.4  32.0 - 36.0 g/dL Final   RDW 05/07/2021 11.9  11.0 - 15.0 % Final   Platelets 05/07/2021 298  140 - 400 Thousand/uL Final   MPV 05/07/2021 8.7  7.5 - 12.5 fL Final   Neutro Abs 05/07/2021 2,136  1,500 - 7,800 cells/uL Final   Lymphs Abs 05/07/2021 1,264  850 - 3,900 cells/uL Final   Absolute Monocytes 05/07/2021 400  200 - 950 cells/uL Final   Eosinophils Absolute 05/07/2021 160  15 - 500 cells/uL Final   Basophils Absolute 05/07/2021 40  0 - 200 cells/uL Final   Neutrophils Relative % 05/07/2021 53.4  % Final   Total Lymphocyte 05/07/2021 31.6  % Final   Monocytes Relative 05/07/2021 10.0  % Final   Eosinophils  Relative 05/07/2021 4.0  % Final   Basophils Relative 05/07/2021 1.0  % Final   Glucose, Bld 05/07/2021 99  65 - 99 mg/dL Final   Comment: .            Fasting reference interval .    BUN 05/07/2021 10  7 - 25 mg/dL Final   Creat 05/07/2021 0.79  0.50 - 1.05 mg/dL Final   eGFR 05/07/2021 84  > OR = 60 mL/min/1.47m Final   Comment: The eGFR is based on the CKD-EPI 2021 equation. To calculate  the new eGFR from a previous Creatinine or Cystatin C result, go to https://www.kidney.org/professionals/ kdoqi/gfr%5Fcalculator    BUN/Creatinine Ratio 144/62/8638NOT APPLICABLE  6 - 22 (calc) Final   Sodium 05/07/2021 143  135 - 146 mmol/L Final   Potassium 05/07/2021 4.7  3.5 - 5.3 mmol/L Final   Chloride 05/07/2021 107  98 - 110 mmol/L Final   CO2 05/07/2021 29  20 - 32 mmol/L Final   Calcium 05/07/2021 9.1  8.6 - 10.4 mg/dL Final   Total Protein 05/07/2021 6.6  6.1 - 8.1 g/dL Final   Albumin 05/07/2021 4.1  3.6 - 5.1 g/dL Final   Globulin  05/07/2021 2.5  1.9 - 3.7 g/dL (calc) Final   AG Ratio 05/07/2021 1.6  1.0 - 2.5 (calc) Final   Total Bilirubin 05/07/2021 0.7  0.2 - 1.2 mg/dL Final   Alkaline phosphatase (APISO) 05/07/2021 75  37 - 153 U/L Final   AST 05/07/2021 16  10 - 35 U/L Final   ALT 05/07/2021 18  6 - 29 U/L Final   Cholesterol 05/07/2021 207 (H)  <200 mg/dL Final   HDL 05/07/2021 60  > OR = 50 mg/dL Final   Triglycerides 05/07/2021 137  <150 mg/dL Final   LDL Cholesterol (Calc) 05/07/2021 122 (H)  mg/dL (calc) Final   Comment: Reference range: <100 . Desirable range <100 mg/dL for primary prevention;   <70 mg/dL for patients with CHD or diabetic patients  with > or = 2 CHD risk factors. Marland Kitchen LDL-C is now calculated using the Martin-Hopkins  calculation, which is a validated novel method providing  better accuracy than the Friedewald equation in the  estimation of LDL-C.  Cresenciano Genre et al. Annamaria Helling. 4403;474(25): 2061-2068  (http://education.[facility].com/faq/FAQ164)     Total CHOL/HDL Ratio 05/07/2021 3.5  <5.0 (calc) Final   Non-HDL Cholesterol (Calc) 05/07/2021 147 (H)  <130 mg/dL (calc) Final   Comment: For patients with diabetes plus 1 major ASCVD risk  factor, treating to a non-HDL-C goal of <100 mg/dL  (LDL-C of <70 mg/dL) is considered a therapeutic  option.    Vitamin B-12 05/07/2021 278  200 - 1,100 pg/mL Final   Comment: . Please Note: Although the reference range for vitamin B12 is [phone] pg/mL, it has been reported that between 5 and 10% of patients with values between 200 and 400 pg/mL may experience neuropsychiatric and hematologic abnormalities due to occult B12 deficiency; less than 1% of patients with values above 400 pg/mL will have symptoms. Marland Kitchen    TSH 05/07/2021 2.59  0.40 - 4.50 mIU/L Final   Vit D, 25-Hydroxy 05/07/2021 34  30 - 100 ng/mL Final   Comment: Vitamin D Status         25-OH Vitamin D: . Deficiency:                    <20 ng/mL Insufficiency:             20 - 29 ng/mL Optimal:                 > or = 30 ng/mL . For 25-OH Vitamin D testing on patients on  D2-supplementation and patients for whom quantitation  of D2 and D3 fractions is required, the [facility]ure[facility](TM) 25-OH VIT D, (D2,D3), LC/MS/MS is recommended: order  code [id number] (patients >72yr). See Note 1 . Note 1 . For additional information, please refer to  http://education.[facility].com/faq/FAQ199  (This link is being provided for informational/ educational purposes only.)    Immunization History  Administered Date(s) Administered   Influenza,inj,Quad PF,6+ Mos 01/26/2019   Zoster Recombinat (Shingrix) 01/26/2019    Past Medical History:  Diagnosis Date   Allergy    Ankle fracture    left   Arthritis    fingers, knees and hips   Asthma    Cellulitis    left foot   GERD (gastroesophageal reflux disease)    OTC med prn   Osteoporosis    PONV (postoperative nausea and vomiting)    Wears glasses    Past Surgical History:   Procedure Laterality Date   ABDOMINAL HYSTERECTOMY     I & D EXTREMITY  Left 10/09/2017   Procedure: IRRIGATION AND DEBRIDEMENT LEFT FOOT;  Surgeon: Leandrew Koyanagi, MD;  Location: [facility];  Service: Orthopedics;  Laterality: Left;   ORIF FIBULA FRACTURE Left 08/21/2016   Procedure: OPEN REDUCTION INTERNAL FIXATION (ORIF) DISTAL FIBULA FRACTURE;  Surgeon: Ninetta Lights, MD;  Location: [facility];  Service: Orthopedics;  Laterality: Left;   ROTATOR CUFF REPAIR Right    TONSILLECTOMY     TUBAL LIGATION     Current Outpatient Medications on File Prior to Visit  Medication Sig Dispense Refill   cetirizine (ZYRTEC) 10 MG tablet Take 10 mg by mouth daily.     cephALEXin (KEFLEX) 500 MG capsule Take 1 capsule (500 mg total) by mouth 3 (three) times daily. (Patient not taking: Reported on 05/10/2021) 21 capsule 0   diazepam (VALIUM) 10 MG tablet Take 1 tablet (10 mg total) by mouth every 8 (eight) hours as needed (muscle spasms). (Patient not taking: Reported on 05/10/2021) 20 tablet 0   diclofenac sodium (VOLTAREN) 1 % GEL Apply 2 g topically 4 (four) times daily. (Patient not taking: Reported on 05/10/2021) 100 g 1   No current facility-administered medications on file prior to visit.   Allergies  Allergen Reactions   Erythromycin Nausea And Vomiting and Other (See Comments)    GI UPSET   Social History   Socioeconomic History   Marital status: Married    Spouse name: Not on file   Number of children: Not on file   Years of education: Not on file   Highest education level: Not on file  Occupational History   Not on file  Tobacco Use   Smoking status: Never   Smokeless tobacco: Never  Vaping Use   Vaping Use: Never used  Substance and Sexual Activity   Alcohol use: No   Drug use: No   Sexual activity: Yes  Other Topics Concern   Not on file  Social History Narrative   Not on file   Social Determinants of Health   Financial Resource Strain: Not on file  Food  Insecurity: Not on file  Transportation Needs: Not on file  Physical Activity: Not on file  Stress: Not on file  Social Connections: Not on file  Intimate Partner Violence: Not on file      Review of Systems  All other systems reviewed and are negative.     Objective:   Physical Exam Constitutional:      General: She is not in acute distress.    Appearance: Normal appearance. She is not ill-appearing, toxic-appearing or diaphoretic.  HENT:     Head: Normocephalic and atraumatic.     Right Ear: Tympanic membrane and ear canal normal.     Left Ear: Tympanic membrane and ear canal normal.     Nose: Nose normal. No congestion or rhinorrhea.     Mouth/Throat:     Mouth: Mucous membranes are moist.     Pharynx: Oropharynx is clear. No oropharyngeal exudate or posterior oropharyngeal erythema.  Eyes:     Extraocular Movements: Extraocular movements intact.     Conjunctiva/sclera: Conjunctivae normal.     Pupils: Pupils are equal, round, and reactive to light.  Neck:     Vascular: No carotid bruit.  Cardiovascular:     Rate and Rhythm: Normal rate and regular rhythm.     Pulses: Normal pulses.     Heart sounds: Normal heart sounds. No murmur heard.   No friction rub. No gallop.  Pulmonary:  Effort: Pulmonary effort is normal. No respiratory distress.     Breath sounds: Normal breath sounds. No stridor. No wheezing.  Abdominal:     General: Abdomen is flat. Bowel sounds are normal. There is no distension.     Palpations: Abdomen is soft.     Tenderness: There is no abdominal tenderness. There is no guarding or rebound.  Musculoskeletal:        General: No swelling, tenderness, deformity or signs of injury.     Cervical back: Normal range of motion and neck supple. No rigidity or tenderness.     Right lower leg: No edema.     Left lower leg: No edema.  Lymphadenopathy:     Cervical: No cervical adenopathy.  Skin:    Findings: No erythema or rash.       Neurological:      General: No focal deficit present.     Mental Status: She is alert and oriented to person, place, and time. Mental status is at baseline.     Motor: No weakness.     Gait: Gait normal.     Deep Tendon Reflexes: Reflexes normal.  Psychiatric:        Mood and Affect: Mood normal.        Behavior: Behavior normal.        Thought Content: Thought content normal.        Judgment: Judgment normal.          Assessment & Plan:  Colon cancer screening - Plan: Ambulatory referral to Gastroenterology  General medical exam  Screening cholesterol level  History of vitamin D deficiency Physical exam is normal.  I reviewed her cholesterol.  Her 10-year risk of cardiovascular disease is 3.6%.  Therefore she does not require statin therapy.  Recommended 1200 mg a day of calcium and 1000 units a day of vitamin D.  Recommended a flu shot which she deferred.  Recommended shingles vaccine and a COVID booster.  Consult GI for colonoscopy.  Mammogram is up-to-date.  Recommend bone density test at age 64.

## 2021-05-21 ENCOUNTER — Other Ambulatory Visit: Payer: Self-pay

## 2021-05-21 ENCOUNTER — Telehealth: Payer: Self-pay | Admitting: Family Medicine

## 2021-05-21 NOTE — Telephone Encounter (Signed)
Form received and placed on provider's desk for signature.  ?

## 2021-05-21 NOTE — Telephone Encounter (Signed)
Patient dropped off forms from job needing completion and provider's signature. Requesting call at [phone] when forms completed and ready for pickup.  Forms placed in hanging folder at nurse's station.

## 2021-05-28 ENCOUNTER — Encounter: Payer: Self-pay | Admitting: Family Medicine

## 2021-05-28 NOTE — Telephone Encounter (Signed)
Forms completed. Pt aware.

## 2021-05-29 ENCOUNTER — Telehealth: Payer: Self-pay

## 2021-05-29 ENCOUNTER — Encounter: Payer: Self-pay | Admitting: Family Medicine

## 2021-05-29 NOTE — Telephone Encounter (Signed)
Form completed and given to front desk

## 2021-05-29 NOTE — Telephone Encounter (Signed)
Pt came in office today to drop off TB screening form to be signed by cpc. Form placed in nurse's folder, will call pt when available for pick up.  Cb#; [phone]

## 2021-09-04 ENCOUNTER — Encounter: Payer: Self-pay | Admitting: Gastroenterology

## 2021-09-04 ENCOUNTER — Ambulatory Visit (AMBULATORY_SURGERY_CENTER): Payer: BC Managed Care – PPO

## 2021-09-04 VITALS — Ht 66.0 in | Wt 180.0 lb

## 2021-09-04 DIAGNOSIS — Z1211 Encounter for screening for malignant neoplasm of colon: Secondary | ICD-10-CM

## 2021-09-04 MED ORDER — NA SULFATE-K SULFATE-MG SULF 17.5-3.13-1.6 GM/177ML PO SOLN: 1.0000 | Freq: Once | ORAL | 0 refills | Status: AC

## 2021-09-04 NOTE — Progress Notes (Signed)

## 2021-09-11 ENCOUNTER — Encounter: Payer: Self-pay | Admitting: Gastroenterology

## 2021-09-11 ENCOUNTER — Ambulatory Visit (AMBULATORY_SURGERY_CENTER): Payer: BC Managed Care – PPO | Admitting: Gastroenterology

## 2021-09-11 VITALS — BP 111/73 | HR 69 | Temp 97.1°F | Resp 14 | Ht 66.0 in | Wt 180.0 lb

## 2021-09-11 DIAGNOSIS — Z1211 Encounter for screening for malignant neoplasm of colon: Secondary | ICD-10-CM | POA: Diagnosis present

## 2021-09-11 DIAGNOSIS — D122 Benign neoplasm of ascending colon: Secondary | ICD-10-CM

## 2021-09-11 DIAGNOSIS — D125 Benign neoplasm of sigmoid colon: Secondary | ICD-10-CM | POA: Diagnosis not present

## 2021-09-11 MED ORDER — SODIUM CHLORIDE 0.9 % IV SOLN: 500.0000 mL | Freq: Once | INTRAVENOUS | Status: DC

## 2021-09-11 NOTE — Patient Instructions (Signed)
Discharge instructions given. ?Handouts on polyps and Diverticulosis. ?Resume previous medications. ?YOU HAD AN ENDOSCOPIC PROCEDURE TODAY AT [facility]:   Refer to the procedure report that was given to you for any specific questions about what was found during the examination.  If the procedure report does not answer your questions, please call your gastroenterologist to clarify.  If you requested that your care partner not be given the details of your procedure findings, then the procedure report has been included in a sealed envelope for you to review at your convenience later. ? ?YOU SHOULD EXPECT: Some feelings of bloating in the abdomen. Passage of more gas than usual.  Walking can help get rid of the air that was put into your GI tract during the procedure and reduce the bloating. If you had a lower endoscopy (such as a colonoscopy or flexible sigmoidoscopy) you may notice spotting of blood in your stool or on the toilet paper. If you underwent a bowel prep for your procedure, you may not have a normal bowel movement for a few days. ? ?Please Note:  You might notice some irritation and congestion in your nose or some drainage.  This is from the oxygen used during your procedure.  There is no need for concern and it should clear up in a day or so. ? ?SYMPTOMS TO REPORT IMMEDIATELY: ? ?Following lower endoscopy (colonoscopy or flexible sigmoidoscopy): ? Excessive amounts of blood in the stool ? Significant tenderness or worsening of abdominal pains ? Swelling of the abdomen that is new, acute ? Fever of 100?F or higher ? ? ?For urgent or emergent issues, a gastroenterologist can be reached at any hour by calling [phone]. ?Do not use MyChart messaging for urgent concerns.  ? ? ?DIET:  We do recommend a small meal at first, but then you may proceed to your regular diet.  Drink plenty of fluids but you should avoid alcoholic beverages for 24 hours. ? ?ACTIVITY:  You should plan to take it  easy for the rest of today and you should NOT DRIVE or use heavy machinery until tomorrow (because of the sedation medicines used during the test).   ? ?FOLLOW UP: ?Our staff will call the number listed on your records 48-72 hours following your procedure to check on you and address any questions or concerns that you may have regarding the information given to you following your procedure. If we do not reach you, we will leave a message.  We will attempt to reach you two times.  During this call, we will ask if you have developed any symptoms of COVID 19. If you develop any symptoms (ie: fever, flu-like symptoms, shortness of breath, cough etc.) before then, please call [phone].  If you test positive for Covid 19 in the 2 weeks post procedure, please call and report this information to [place].   ? ?If any biopsies were taken you will be contacted by phone or by letter within the next 1-3 weeks.  Please call us at [phone] if you have not heard about the biopsies in 3 weeks.  ? ? ?SIGNATURES/CONFIDENTIALITY: ?You and/or your care partner have signed paperwork which will be entered into your electronic medical record.  These signatures attest to the fact that that the information above on your After Visit Summary has been reviewed and is understood.  Full responsibility of the confidentiality of this discharge information lies with you and/or your care-partner.  ?

## 2021-09-11 NOTE — Progress Notes (Signed)
Report to PACU, RN, vss, BBS= Clear.  

## 2021-09-11 NOTE — Progress Notes (Signed)
VS completed by DT.  Pt's states no medical or surgical changes since previsit or office visit.  

## 2021-09-11 NOTE — Op Note (Signed)
[place] ?Patient Name: Madison Bryant ?Procedure Date: 09/11/2021 9:57 AM ?MRN: [id number] ?Endoscopist: Iolanda Folson E. Candis Schatz , MD ?Age: 66 ?Referring MD:  ?Date of Birth: January 10, 1956 ?Gender: Female ?Account #: [id number] ?Procedure:                Colonoscopy ?Indications:              Screening for colorectal malignant neoplasm (last  ?                          colonoscopy was more than 10 years ago) ?Medicines:                Monitored Anesthesia Care ?Procedure:                Pre-Anesthesia Assessment: ?                          - Prior to the procedure, a History and Physical  ?                          was performed, and patient medications and  ?                          allergies were reviewed. The patient's tolerance of  ?                          previous anesthesia was also reviewed. The risks  ?                          and benefits of the procedure and the sedation  ?                          options and risks were discussed with the patient.  ?                          All questions were answered, and informed consent  ?                          was obtained. Prior Anticoagulants: The patient has  ?                          taken no previous anticoagulant or antiplatelet  ?                          agents. ASA Grade Assessment: II - A patient with  ?                          mild systemic disease. After reviewing the risks  ?                          and benefits, the patient was deemed in  ?                          satisfactory condition to undergo the procedure. ?  After obtaining informed consent, the colonoscope  ?                          was passed under direct vision. Throughout the  ?                          procedure, the patient's blood pressure, pulse, and  ?                          oxygen saturations were monitored continuously. The  ?                          CF [id number] #[id number] was introduced through the anus  ?                          and advanced  to the the terminal ileum, with  ?                          identification of the appendiceal orifice and IC  ?                          valve. The colonoscopy was performed without  ?                          difficulty. The patient tolerated the procedure  ?                          well. The quality of the bowel preparation was  ?                          good. The terminal ileum, ileocecal valve,  ?                          appendiceal orifice, and rectum were photographed. ?Scope In: 10:07:11 AM ?Scope Out: 10:24:04 AM ?Scope Withdrawal Time: 0 hours 12 minutes 37 seconds  ?Total Procedure Duration: 0 hours 16 minutes 53 seconds  ?Findings:                 An anal fissure was found on perianal exam. ?                          The digital rectal exam was normal. Pertinent  ?                          negatives include normal sphincter tone and no  ?                          palpable rectal lesions. ?                          A 3 mm polyp was found in the ascending colon. The  ?                          polyp was sessile. The polyp was removed  with a  ?                          cold snare. Resection and retrieval were complete.  ?                          Estimated blood loss was minimal. ?                          A 3 mm polyp was found in the sigmoid colon. The  ?                          polyp was sessile. The polyp was removed with a  ?                          cold snare. Resection and retrieval were complete.  ?                          Estimated blood loss was minimal. ?                          Many small and large-mouthed diverticula were found  ?                          in the sigmoid colon and descending colon. There  ?                          was narrowing of the colon in association with the  ?                          diverticular opening. ?                          The exam was otherwise normal throughout the  ?                          examined colon. ?                          The terminal ileum  appeared normal. ?                          The retroflexed view of the distal rectum and anal  ?                          verge was normal and showed no anal or rectal  ?                          abnormalities. ?Complications:            No immediate complications. ?Estimated Blood Loss:     Estimated blood loss was minimal. ?Impression:               - Anal fissure found on perianal exam. ?                          -  One 3 mm polyp in the ascending colon, removed  ?                          with a cold snare. Resected and retrieved. ?                          - One 3 mm polyp in the sigmoid colon, removed with  ?                          a cold snare. Resected and retrieved. ?                          - Moderate diverticulosis in the sigmoid colon and  ?                          in the descending colon. There was narrowing of the  ?                          colon in association with the diverticular opening. ?                          - The examined portion of the ileum was normal. ?                          - The distal rectum and anal verge are normal on  ?                          retroflexion view. ?Recommendation:           - Patient has a contact number available for  ?                          emergencies. The signs and symptoms of potential  ?                          delayed complications were discussed with the  ?                          patient. Return to normal activities tomorrow.  ?                          Written discharge instructions were provided to the  ?                          patient. ?                          - Resume previous diet. ?                          - Continue present medications. ?                          - Await pathology results. ?                          -  Repeat colonoscopy (date not yet determined) for  ?                          surveillance based on pathology results. ?Lacie Landry E. Candis Schatz, MD ?09/11/2021 10:30:35 AM ?This report has been signed electronically. ?

## 2021-09-11 NOTE — Progress Notes (Signed)
Called to room to assist during endoscopic procedure.  Patient ID and intended procedure confirmed with present staff. Received instructions for my participation in the procedure from the performing physician.  

## 2021-09-11 NOTE — Progress Notes (Signed)
[facility] History and Physical ? ? ?Primary Care Physician:  Susy Frizzle, MD ? ? ?Reason for Procedure:   Colon cancer screening ? ?Plan:    Screening colonoscopy ? ? ? ? ?HPI: Madison Bryant is a 66 y.o. female undergoing average risk screening colonoscopy.  She has no family history of colon cancer and no chronic GI symptoms. She had a normal colonoscopy (per patient recollection) in 2006. ? ? ?Past Medical History:  ?Diagnosis Date  ? Allergy   ? seasonal, pollen, dust, cedar  ? Ankle fracture   ? left  ? Arthritis   ? fingers, knees and hips  ? Asthma   ? Cellulitis   ? left foot  ? GERD (gastroesophageal reflux disease)   ? OTC med prn  ? PONV (postoperative nausea and vomiting)   ? Wears glasses   ? ? ?Past Surgical History:  ?Procedure Laterality Date  ? ABDOMINAL HYSTERECTOMY    ? COLONOSCOPY  2006  ? I & D EXTREMITY Left 10/09/2017  ? Procedure: IRRIGATION AND DEBRIDEMENT LEFT FOOT;  Surgeon: Leandrew Koyanagi, MD;  Location: [place];  Service: Orthopedics;  Laterality: Left;  ? ORIF FIBULA FRACTURE Left 08/21/2016  ? Procedure: OPEN REDUCTION INTERNAL FIXATION (ORIF) DISTAL FIBULA FRACTURE;  Surgeon: Ninetta Lights, MD;  Location: [place];  Service: Orthopedics;  Laterality: Left;  ? ROTATOR CUFF REPAIR Right   ? TONSILLECTOMY    ? TUBAL LIGATION    ? ? ?Prior to Admission medications   ?Medication Sig Start Date End Date Taking? Authorizing Provider  ?acetaminophen (TYLENOL) 325 MG tablet Take 650 mg by mouth every 6 (six) hours as needed.   Yes [provider]  ?cetirizine (ZYRTEC) 10 MG tablet Take 10 mg by mouth daily.   Yes [provider]  ?Cyanocobalamin (VITAMIN B 12 PO) Take by mouth.   Yes [provider]  ?Multiple Vitamins-Minerals (MULTIVITAMIN ADULTS 50+) TABS Take by mouth.   Yes [provider]  ?vitamin C (ASCORBIC ACID) 500 MG tablet Take 500 mg by mouth daily.   Yes [provider]  ?VITAMIN D,  CHOLECALCIFEROL, PO Take by mouth.   Yes [provider]  ?XIIDRA 5 % SOLN  03/20/21  Yes [provider]  ?diclofenac sodium (VOLTAREN) 1 % GEL Apply 2 g topically 4 (four) times daily. 03/02/19   Susy Frizzle, MD  ?ketoconazole (NIZORAL) 2 % shampoo SMARTSIG:sparingly Topical Twice a Week 08/07/21   [provider]  ? ? ?Current Outpatient Medications  ?Medication Sig Dispense Refill  ? acetaminophen (TYLENOL) 325 MG tablet Take 650 mg by mouth every 6 (six) hours as needed.    ? cetirizine (ZYRTEC) 10 MG tablet Take 10 mg by mouth daily.    ? Cyanocobalamin (VITAMIN B 12 PO) Take by mouth.    ? Multiple Vitamins-Minerals (MULTIVITAMIN ADULTS 50+) TABS Take by mouth.    ? vitamin C (ASCORBIC ACID) 500 MG tablet Take 500 mg by mouth daily.    ? VITAMIN D, CHOLECALCIFEROL, PO Take by mouth.    ? XIIDRA 5 % SOLN     ? diclofenac sodium (VOLTAREN) 1 % GEL Apply 2 g topically 4 (four) times daily. 100 g 1  ? ketoconazole (NIZORAL) 2 % shampoo SMARTSIG:sparingly Topical Twice a Week    ? ?Current Facility-Administered Medications  ?Medication Dose Route Frequency Provider Last Rate Last Admin  ? 0.9 %  sodium chloride infusion  500 mL Intravenous Once [place],  Gladstone Pih, MD      ? ? ?Allergies as of 09/11/2021 - Review Complete 09/11/2021  ?Allergen Reaction Noted  ? Erythromycin Nausea And Vomiting and Other (See Comments) 01/18/2014  ? ? ?Family History  ?Problem Relation Age of Onset  ? Miscarriages / [place] Mother   ? ? ?Social History  ? ?Socioeconomic History  ? Marital status: Married  ?  Spouse name: Not on file  ? Number of children: Not on file  ? Years of education: Not on file  ? Highest education level: Not on file  ?Occupational History  ? Not on file  ?Tobacco Use  ? Smoking status: Never  ? Smokeless tobacco: Never  ?Vaping Use  ? Vaping Use: Never used  ?Substance and Sexual Activity  ? Alcohol use: No  ? Drug use: No  ? Sexual activity: Yes  ?Other Topics Concern  ?  Not on file  ?Social History Narrative  ? Not on file  ? ?Social Determinants of Health  ? ?Financial Resource Strain: Not on file  ?Food Insecurity: Not on file  ?Transportation Needs: Not on file  ?Physical Activity: Not on file  ?Stress: Not on file  ?Social Connections: Not on file  ?Intimate Partner Violence: Not on file  ? ? ?Review of Systems: ? ?All other review of systems negative except as mentioned in the HPI. ? ?Physical Exam: ?Vital signs ?BP 119/82   Pulse 75   Temp (!) 97.1 ?F (36.2 ?C) (Temporal)   Resp 16   Ht '5\' 6"'$  (1.676 m)   Wt 180 lb (81.6 kg)   SpO2 100%   BMI 29.05 kg/m?  ? ?General:   Alert,  Well-developed, well-nourished, pleasant and cooperative in NAD ?Airway:  Mallampati 3 ?Lungs:  Clear throughout to auscultation.   ?Heart:  Regular rate and rhythm; no murmurs, clicks, rubs,  or gallops. ?Abdomen:  Soft, nontender and nondistended. Normal bowel sounds.   ?Neuro/Psych:  Normal mood and affect. A and O x 3 ? ? ?Aubra Pappalardo E. Candis Schatz, MD ?[facility] ? ?

## 2021-09-13 ENCOUNTER — Telehealth: Payer: Self-pay | Admitting: *Deleted

## 2021-09-13 NOTE — Telephone Encounter (Signed)
?  Follow up Call- ? ? ?  09/11/2021  ?  9:40 AM  ?Call back number  ?Post procedure Call Back phone  # [phone]  ?Permission to leave phone message Yes  ?  ? ?Patient questions: ? ?Do you have a fever, pain , or abdominal swelling? No. ?Pain Score  0 * ? ?Have you tolerated food without any problems? Yes.   ? ?Have you been able to return to your normal activities? Yes.   ? ?Do you have any questions about your discharge instructions: ?Diet   No. ?Medications  No. ?Follow up visit  No. ? ?Do you have questions or concerns about your Care? No. ? ?Actions: ?* If pain score is 4 or above: ?No action needed, pain <4. ? ? ?

## 2021-09-16 NOTE — Progress Notes (Signed)
Madison Bryant, ?One polyp which I removed during your recent procedure was proven to be completely benign but is considered a "pre-cancerous" polyp that MAY have grown into cancer if it had not been removed.  Studies shows that at least 20% of women over age 64 and 30% of men over age 45 have pre-cancerous polyps.  Based on current nationally recognized surveillance guidelines, I recommend that you have a repeat colonoscopy in 7 years.  ? ?If you develop any new rectal bleeding, abdominal pain or significant bowel habit changes, please contact me before then. ? ?

## 2021-10-01 ENCOUNTER — Encounter: Payer: Self-pay | Admitting: Family Medicine

## 2021-10-03 ENCOUNTER — Ambulatory Visit: Payer: BC Managed Care – PPO | Admitting: Family Medicine

## 2021-10-03 ENCOUNTER — Encounter: Payer: Self-pay | Admitting: Family Medicine

## 2021-10-03 VITALS — BP 138/88 | HR 77 | Temp 96.4°F | Ht 66.0 in | Wt 177.4 lb

## 2021-10-03 DIAGNOSIS — R5383 Other fatigue: Secondary | ICD-10-CM | POA: Diagnosis not present

## 2021-10-03 NOTE — Progress Notes (Signed)
Subjective:    Patient ID: Madison Bryant, female    DOB: Aug 13, 1955, 66 y.o.   MRN: [id number]  HPI  Patient's lab work in December was normal.  She states that she is having worsening fatigue.  She reports no energy.  She does not able to get out of bed in the morning.  She denies any chest pain or shortness of breath or dyspnea on exertion.  She denies any fevers or chills or night sweats.  She denies any bone pain.  She denies any melena or hematochezia.  She denies any nausea or vomiting.  She denies any cough or pleurisy or hemoptysis.  The fatigue suddenly worsened after her most recent colonoscopy that was normal.  Cancer screening is up-to-date and normal.  She has recently lost 8 pounds but she has been exercising so this was intentional.  She is under tremendous stress caring for her sister.    Past Medical History:  Diagnosis Date   Allergy    seasonal, pollen, dust, cedar   Ankle fracture    left   Arthritis    fingers, knees and hips   Asthma    Cellulitis    left foot   GERD (gastroesophageal reflux disease)    OTC med prn   PONV (postoperative nausea and vomiting)    Wears glasses    Past Surgical History:  Procedure Laterality Date   ABDOMINAL HYSTERECTOMY     COLONOSCOPY  2006   I & D EXTREMITY Left 10/09/2017   Procedure: IRRIGATION AND DEBRIDEMENT LEFT FOOT;  Surgeon: Leandrew Koyanagi, MD;  Location: [place];  Service: Orthopedics;  Laterality: Left;   ORIF FIBULA FRACTURE Left 08/21/2016   Procedure: OPEN REDUCTION INTERNAL FIXATION (ORIF) DISTAL FIBULA FRACTURE;  Surgeon: Ninetta Lights, MD;  Location: [place];  Service: Orthopedics;  Laterality: Left;   ROTATOR CUFF REPAIR Right    TONSILLECTOMY     TUBAL LIGATION     Current Outpatient Medications on File Prior to Visit  Medication Sig Dispense Refill   acetaminophen (TYLENOL) 325 MG tablet Take 650 mg by mouth every 6 (six) hours as needed.     cetirizine (ZYRTEC) 10 MG tablet Take  10 mg by mouth daily.     Cyanocobalamin (VITAMIN B 12 PO) Take by mouth.     ketoconazole (NIZORAL) 2 % shampoo SMARTSIG:sparingly Topical Twice a Week     Multiple Vitamins-Minerals (MULTIVITAMIN ADULTS 50+) TABS Take by mouth.     vitamin C (ASCORBIC ACID) 500 MG tablet Take 500 mg by mouth daily.     VITAMIN D, CHOLECALCIFEROL, PO Take by mouth.     XIIDRA 5 % SOLN      No current facility-administered medications on file prior to visit.   Allergies  Allergen Reactions   Erythromycin Nausea And Vomiting and Other (See Comments)    GI UPSET   Social History   Socioeconomic History   Marital status: Married    Spouse name: Not on file   Number of children: Not on file   Years of education: Not on file   Highest education level: Not on file  Occupational History   Not on file  Tobacco Use   Smoking status: Never   Smokeless tobacco: Never  Vaping Use   Vaping Use: Never used  Substance and Sexual Activity   Alcohol use: No   Drug use: No   Sexual activity: Yes  Other Topics Concern   Not on  file  Social History Narrative   Not on file   Social Determinants of Health   Financial Resource Strain: Not on file  Food Insecurity: Not on file  Transportation Needs: Not on file  Physical Activity: Not on file  Stress: Not on file  Social Connections: Not on file  Intimate Partner Violence: Not on file      Review of Systems  All other systems reviewed and are negative.     Objective:   Physical Exam Constitutional:      General: She is not in acute distress.    Appearance: Normal appearance. She is not ill-appearing, toxic-appearing or diaphoretic.  HENT:     Head: Normocephalic and atraumatic.     Right Ear: Tympanic membrane and ear canal normal.     Left Ear: Tympanic membrane and ear canal normal.     Nose: Nose normal. No congestion or rhinorrhea.     Mouth/Throat:     Mouth: Mucous membranes are moist.     Pharynx: Oropharynx is clear. No  oropharyngeal exudate or posterior oropharyngeal erythema.  Eyes:     Extraocular Movements: Extraocular movements intact.     Conjunctiva/sclera: Conjunctivae normal.     Pupils: Pupils are equal, round, and reactive to light.  Neck:     Vascular: No carotid bruit.  Cardiovascular:     Rate and Rhythm: Normal rate and regular rhythm.     Pulses: Normal pulses.     Heart sounds: Normal heart sounds. No murmur heard.   No friction rub. No gallop.  Pulmonary:     Effort: Pulmonary effort is normal. No respiratory distress.     Breath sounds: Normal breath sounds. No stridor. No wheezing.  Abdominal:     General: Abdomen is flat. Bowel sounds are normal. There is no distension.     Palpations: Abdomen is soft.     Tenderness: There is no abdominal tenderness. There is no guarding or rebound.  Musculoskeletal:        General: No swelling, tenderness, deformity or signs of injury.     Cervical back: Normal range of motion and neck supple. No rigidity or tenderness.     Right lower leg: No edema.     Left lower leg: No edema.  Lymphadenopathy:     Cervical: No cervical adenopathy.  Skin:    Findings: No erythema or rash.       Neurological:     General: No focal deficit present.     Mental Status: She is alert and oriented to person, place, and time. Mental status is at baseline.     Motor: No weakness.     Gait: Gait normal.     Deep Tendon Reflexes: Reflexes normal.  Psychiatric:        Mood and Affect: Mood normal.        Behavior: Behavior normal.        Thought Content: Thought content normal.        Judgment: Judgment normal.          Assessment & Plan:  Fatigue, unspecified type - Plan: CBC with Differential/Platelet, COMPLETE METABOLIC PANEL WITH GFR, TSH, Vitamin B12, Sedimentation rate Her review of systems is normal.  I believe the stress is likely causing the fatigue.  I believe that she has caregiver fatigue.  I will check a CBC to rule out anemia after her  colonoscopy.  Check a CMP, TSH, B12 as well as a sed rate.  If lab work  is normal, and review of systems remains negative, I would recommend stress reduction as a means to help manage her fatigue.

## 2021-10-04 LAB — VITAMIN B12: Vitamin B-12: 300 pg/mL (ref 200–1100)

## 2021-10-04 LAB — COMPLETE METABOLIC PANEL WITH GFR
AG Ratio: 1.7 (calc) (ref 1.0–2.5)
ALT: 20 U/L (ref 6–29)
AST: 21 U/L (ref 10–35)
Albumin: 4.5 g/dL (ref 3.6–5.1)
Alkaline phosphatase (APISO): 91 U/L (ref 37–153)
BUN: 13 mg/dL (ref 7–25)
CO2: 24 mmol/L (ref 20–32)
Calcium: 9.7 mg/dL (ref 8.6–10.4)
Chloride: 105 mmol/L (ref 98–110)
Creat: 0.79 mg/dL (ref 0.50–1.05)
Globulin: 2.6 g/dL (calc) (ref 1.9–3.7)
Glucose, Bld: 103 mg/dL — ABNORMAL HIGH (ref 65–99)
Potassium: 4.6 mmol/L (ref 3.5–5.3)
Sodium: 141 mmol/L (ref 135–146)
Total Bilirubin: 0.7 mg/dL (ref 0.2–1.2)
Total Protein: 7.1 g/dL (ref 6.1–8.1)
eGFR: 84 mL/min/{1.73_m2} (ref >=60)

## 2021-10-04 LAB — CBC WITH DIFFERENTIAL/PLATELET
Absolute Monocytes: 482 cells/uL (ref 200–950)
Basophils Absolute: 50 cells/uL (ref 0–200)
Basophils Relative: 1.1 %
Eosinophils Absolute: 99 cells/uL (ref 15–500)
Eosinophils Relative: 2.2 %
HCT: 37.9 % (ref 35.0–45.0)
Hemoglobin: 13.1 g/dL (ref 11.7–15.5)
Lymphs Abs: 1143 cells/uL (ref 850–3900)
MCH: 30.8 pg (ref 27.0–33.0)
MCHC: 34.6 g/dL (ref 32.0–36.0)
MCV: 89 fL (ref 80.0–100.0)
MPV: 9.2 fL (ref 7.5–12.5)
Monocytes Relative: 10.7 %
Neutro Abs: 2727 cells/uL (ref 1500–7800)
Neutrophils Relative %: 60.6 %
Platelets: 299 10*3/uL (ref 140–400)
RBC: 4.26 10*6/uL (ref 3.80–5.10)
RDW: 11.8 % (ref 11.0–15.0)
Total Lymphocyte: 25.4 %
WBC: 4.5 10*3/uL (ref 3.8–10.8)

## 2021-10-04 LAB — TSH: TSH: 2 mIU/L (ref 0.40–4.50)

## 2021-10-04 LAB — SEDIMENTATION RATE: Sed Rate: 25 mm/h (ref 0–30)

## 2021-10-08 ENCOUNTER — Telehealth: Payer: Self-pay

## 2021-10-08 NOTE — Telephone Encounter (Signed)
I left a message for the patient to return my call.  Regarding labs

## 2021-10-08 NOTE — Telephone Encounter (Signed)
-----   Message from Susy Frizzle, MD sent at 10/04/2021  6:47 AM EDT ----- Labs look normal.  No explanation for fatigue. B12 is borderline low, wouldn't hurt to take a b12 supplement.  But I suspect stress is the cause of her fatigue.

## 2022-01-21 ENCOUNTER — Encounter: Payer: Self-pay | Admitting: Family Medicine

## 2022-04-14 ENCOUNTER — Ambulatory Visit: Payer: BC Managed Care – PPO | Admitting: Family Medicine

## 2022-04-14 ENCOUNTER — Encounter: Payer: Self-pay | Admitting: Family Medicine

## 2022-04-14 VITALS — BP 120/78 | HR 81 | Ht 66.0 in | Wt 178.0 lb

## 2022-04-14 DIAGNOSIS — B9689 Other specified bacterial agents as the cause of diseases classified elsewhere: Secondary | ICD-10-CM | POA: Diagnosis not present

## 2022-04-14 DIAGNOSIS — J019 Acute sinusitis, unspecified: Secondary | ICD-10-CM | POA: Diagnosis not present

## 2022-04-14 MED ORDER — LEVOFLOXACIN 500 MG PO TABS: 500.0000 mg | ORAL_TABLET | Freq: Every day | ORAL | 0 refills | Status: AC

## 2022-04-14 MED ORDER — PREDNISONE 20 MG PO TABS: ORAL_TABLET | ORAL | 0 refills | Status: DC

## 2022-04-14 NOTE — Progress Notes (Signed)
Subjective:    Patient ID: Madison Bryant, female    DOB: 10-Sep-1955, 66 y.o.   MRN: [id number]  Sinus Problem  Depression       Patient has been dealing with a sinus infection since early November.  The pain is centered in her left maxillary sinus.  She states that her teeth hurt.  Her head hurts.  She has been having copious drainage.  She went to a urgent care and was given amoxicillin along with a nasal steroid spray however that did not alleviate the symptoms.  She complains of daily sinus congestion, daily runny nose along with the sinus pain and the dental pain.  She denies any cough or shortness of breath.  Past Medical History:  Diagnosis Date   Allergy    seasonal, pollen, dust, cedar   Ankle fracture    left   Arthritis    fingers, knees and hips   Asthma    Cellulitis    left foot   GERD (gastroesophageal reflux disease)    OTC med prn   PONV (postoperative nausea and vomiting)    Wears glasses    Past Surgical History:  Procedure Laterality Date   ABDOMINAL HYSTERECTOMY     COLONOSCOPY  2006   I & D EXTREMITY Left 10/09/2017   Procedure: IRRIGATION AND DEBRIDEMENT LEFT FOOT;  Surgeon: Leandrew Koyanagi, MD;  Location: [place];  Service: Orthopedics;  Laterality: Left;   ORIF FIBULA FRACTURE Left 08/21/2016   Procedure: OPEN REDUCTION INTERNAL FIXATION (ORIF) DISTAL FIBULA FRACTURE;  Surgeon: Ninetta Lights, MD;  Location: [place];  Service: Orthopedics;  Laterality: Left;   ROTATOR CUFF REPAIR Right    TONSILLECTOMY     TUBAL LIGATION     Current Outpatient Medications on File Prior to Visit  Medication Sig Dispense Refill   acetaminophen (TYLENOL) 325 MG tablet Take 650 mg by mouth every 6 (six) hours as needed.     amoxicillin-clavulanate (AUGMENTIN) 875-125 MG tablet SMARTSIG:1 Tablet(s) By Mouth Every 12 Hours     Azelastine HCl 137 MCG/SPRAY SOLN Place into both nostrils.     cetirizine (ZYRTEC) 10 MG tablet Take 10 mg by mouth daily.      ketoconazole (NIZORAL) 2 % shampoo SMARTSIG:sparingly Topical Twice a Week     Multiple Vitamins-Minerals (MULTIVITAMIN ADULTS 50+) TABS Take by mouth.     vitamin C (ASCORBIC ACID) 500 MG tablet Take 500 mg by mouth daily.     VITAMIN D, CHOLECALCIFEROL, PO Take by mouth.     XIIDRA 5 % SOLN      Cyanocobalamin (VITAMIN B 12 PO) Take by mouth. (Patient not taking: Reported on 04/14/2022)     No current facility-administered medications on file prior to visit.   Allergies  Allergen Reactions   Erythromycin Nausea And Vomiting and Other (See Comments)    GI UPSET   Social History   Socioeconomic History   Marital status: Married    Spouse name: Not on file   Number of children: Not on file   Years of education: Not on file   Highest education level: Not on file  Occupational History   Not on file  Tobacco Use   Smoking status: Never   Smokeless tobacco: Never  Vaping Use   Vaping Use: Never used  Substance and Sexual Activity   Alcohol use: No   Drug use: No   Sexual activity: Yes  Other Topics Concern   Not on file  Social History Narrative   Not on file   Social Determinants of Health   Financial Resource Strain: Not on file  Food Insecurity: Not on file  Transportation Needs: Not on file  Physical Activity: Not on file  Stress: Not on file  Social Connections: Not on file  Intimate Partner Violence: Not on file      Review of Systems  Psychiatric/Behavioral:  Positive for depression.   All other systems reviewed and are negative.      Objective:   Physical Exam Constitutional:      General: She is not in acute distress.    Appearance: Normal appearance. She is not ill-appearing, toxic-appearing or diaphoretic.  HENT:     Head: Normocephalic and atraumatic.     Right Ear: Tympanic membrane and ear canal normal.     Left Ear: Tympanic membrane and ear canal normal.     Nose: Congestion and rhinorrhea present.     Left Sinus: Maxillary sinus  tenderness present.     Mouth/Throat:     Mouth: Mucous membranes are moist.     Pharynx: Oropharynx is clear. No oropharyngeal exudate or posterior oropharyngeal erythema.  Eyes:     Extraocular Movements: Extraocular movements intact.     Conjunctiva/sclera: Conjunctivae normal.     Pupils: Pupils are equal, round, and reactive to light.  Neck:     Vascular: No carotid bruit.  Cardiovascular:     Rate and Rhythm: Normal rate and regular rhythm.     Pulses: Normal pulses.     Heart sounds: Normal heart sounds. No murmur heard.    No friction rub. No gallop.  Pulmonary:     Effort: Pulmonary effort is normal. No respiratory distress.     Breath sounds: Normal breath sounds. No stridor. No wheezing.  Abdominal:     General: Abdomen is flat. Bowel sounds are normal. There is no distension.     Palpations: Abdomen is soft.     Tenderness: There is no abdominal tenderness. There is no guarding or rebound.  Musculoskeletal:        General: No swelling, tenderness, deformity or signs of injury.     Cervical back: Normal range of motion and neck supple. No rigidity or tenderness.     Right lower leg: No edema.     Left lower leg: No edema.  Lymphadenopathy:     Cervical: No cervical adenopathy.  Skin:    Findings: No erythema or rash.       Neurological:     General: No focal deficit present.     Mental Status: She is alert and oriented to person, place, and time. Mental status is at baseline.     Motor: No weakness.     Gait: Gait normal.     Deep Tendon Reflexes: Reflexes normal.  Psychiatric:        Mood and Affect: Mood normal.        Behavior: Behavior normal.        Thought Content: Thought content normal.        Judgment: Judgment normal.           Assessment & Plan:  Acute bacterial rhinosinusitis Patient has a sinus infection in her left maxillary sinus.  She believes that she may have been given Augmentin.  Therefore I will start her on Levaquin 500 mg a day  for 7 days.  We will try prednisone taper pack to decrease the swelling of the sinus passages and facilitate  drainage.  Also recommended nasal saline to help facilitate drainage of the sinus cavity.

## 2022-05-21 ENCOUNTER — Telehealth: Payer: Self-pay

## 2022-05-21 NOTE — Telephone Encounter (Signed)
FAX FROM [facility]:  Fax received from [facility] stating:  1.3 cm x 1.6 cm x 1.5 cm irregular mass in the right breast suspicious of malignancy.  Th patient was advised verbally at time of exam per fax.   Copy of fax in your green folder. Thank you

## 2022-05-22 ENCOUNTER — Other Ambulatory Visit: Payer: Self-pay | Admitting: Family Medicine

## 2022-05-22 DIAGNOSIS — N6310 Unspecified lump in the right breast, unspecified quadrant: Secondary | ICD-10-CM

## 2022-05-23 ENCOUNTER — Other Ambulatory Visit: Payer: Self-pay

## 2022-05-23 LAB — HM MAMMOGRAPHY

## 2022-05-27 ENCOUNTER — Telehealth: Payer: Self-pay | Admitting: Hematology and Oncology

## 2022-05-27 ENCOUNTER — Encounter: Payer: Self-pay | Admitting: Family Medicine

## 2022-05-27 NOTE — Telephone Encounter (Signed)
Spoke to patient to confirm upcoming morning [facility] on 1/24, paperwork will be sent via [facility].   Gave location and time, also informed patient that the surgeon's office would be calling as well to get information from them similar to the packet that they will be receiving so make sure to do both.  Reminded patient that all providers will be coming to the clinic to see them HERE and if they had any questions to not hesitate to reach back out to myself or their navigators.

## 2022-06-02 ENCOUNTER — Ambulatory Visit: Payer: Self-pay | Admitting: Surgery

## 2022-06-02 ENCOUNTER — Encounter: Payer: Self-pay | Admitting: *Deleted

## 2022-06-02 DIAGNOSIS — D0511 Intraductal carcinoma in situ of right breast: Secondary | ICD-10-CM

## 2022-06-02 DIAGNOSIS — C50811 Malignant neoplasm of overlapping sites of right female breast: Secondary | ICD-10-CM | POA: Insufficient documentation

## 2022-06-02 NOTE — Progress Notes (Signed)
Radiation Oncology         [phone] ________________________________  Name: Madison Bryant        MRN: [id number]  Date of Service: 06/04/2022 DOB: 19-Sep-1955  KW:[id number], Cammie Mcgee, MD  Donnie Mesa, MD     REFERRING PHYSICIAN: Donnie Mesa, MD   DIAGNOSIS: The encounter diagnosis was Malignant neoplasm of overlapping sites of right breast in female, estrogen receptor positive ([place]).   HISTORY OF PRESENT ILLNESS: Madison Bryant is a 67 y.o. female seen in the multidisciplinary breast clinic for a new diagnosis of right breast cancer.  The patient had a screening mammogram that identified a right breast asymmetry.  She returned for further diagnostic workup.  By ultrasound the lesion measured up to 1.6 cm in the right breast at 12:00 correlating to mammography findings.  Her axilla was negative for adenopathy.  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  The tumor cells were ER/PR positive.  She is seen today to discuss treatment recommendations of her cancer.    PREVIOUS RADIATION THERAPY: {EXAM; YES/NO:19492::"No"}   PAST MEDICAL HISTORY:  Past Medical History:  Diagnosis Date   Allergy    seasonal, pollen, dust, cedar   Ankle fracture    left   Arthritis    fingers, knees and hips   Asthma    Cellulitis    left foot   GERD (gastroesophageal reflux disease)    OTC med prn   PONV (postoperative nausea and vomiting)    Wears glasses        PAST SURGICAL HISTORY: Past Surgical History:  Procedure Laterality Date   ABDOMINAL HYSTERECTOMY     COLONOSCOPY  2006   I & D EXTREMITY Left 10/09/2017   Procedure: IRRIGATION AND DEBRIDEMENT LEFT FOOT;  Surgeon: Leandrew Koyanagi, MD;  Location: [place];  Service: Orthopedics;  Laterality: Left;   ORIF FIBULA FRACTURE Left 08/21/2016   Procedure: OPEN REDUCTION INTERNAL FIXATION (ORIF) DISTAL FIBULA FRACTURE;  Surgeon: Ninetta Lights, MD;  Location: [place];   Service: Orthopedics;  Laterality: Left;   ROTATOR CUFF REPAIR Right    TONSILLECTOMY     TUBAL LIGATION       FAMILY HISTORY:  Family History  Problem Relation Age of Onset   Miscarriages / Stillbirths Mother      SOCIAL HISTORY:  reports that she has never smoked. She has never used smokeless tobacco. She reports that she does not drink alcohol and does not use drugs.  The patient is married and lives in [place],  [place].   ALLERGIES: Erythromycin   MEDICATIONS:  Current Outpatient Medications  Medication Sig Dispense Refill   acetaminophen (TYLENOL) 325 MG tablet Take 650 mg by mouth every 6 (six) hours as needed.     amoxicillin-clavulanate (AUGMENTIN) 875-125 MG tablet SMARTSIG:1 Tablet(s) By Mouth Every 12 Hours     Azelastine HCl 137 MCG/SPRAY SOLN Place into both nostrils.     cetirizine (ZYRTEC) 10 MG tablet Take 10 mg by mouth daily.     Cyanocobalamin (VITAMIN B 12 PO) Take by mouth. (Patient not taking: Reported on 04/14/2022)     ketoconazole (NIZORAL) 2 % shampoo SMARTSIG:sparingly Topical Twice a Week     Multiple Vitamins-Minerals (MULTIVITAMIN ADULTS 50+) TABS Take by mouth.     predniSONE (DELTASONE) 20 MG tablet 3 tabs poqday 1-2, 2 tabs poqday 3-4, 1 tab poqday 5-6 12 tablet 0   vitamin C (ASCORBIC ACID) 500  MG tablet Take 500 mg by mouth daily.     VITAMIN D, CHOLECALCIFEROL, PO Take by mouth.     XIIDRA 5 % SOLN      No current facility-administered medications for this visit.     REVIEW OF SYSTEMS: On review of systems, the patient reports that she is doing ***     PHYSICAL EXAM:  Wt Readings from Last 3 Encounters:  04/14/22 178 lb (80.7 kg)  10/03/21 177 lb 6.4 oz (80.5 kg)  09/11/21 180 lb (81.6 kg)   Temp Readings from Last 3 Encounters:  10/03/21 (!) 96.4 F (35.8 C)  09/11/21 (!) 97.1 F (36.2 C) (Temporal)  05/10/21 97.7 F (36.5 C) (Temporal)   BP Readings from Last 3 Encounters:  04/14/22 120/78  10/03/21 138/88   09/11/21 111/73   Pulse Readings from Last 3 Encounters:  04/14/22 81  10/03/21 77  09/11/21 69    In general this is a well appearing *** female in no acute distress. She's alert and oriented x4 and appropriate throughout the examination. Cardiopulmonary assessment is negative for acute distress and she exhibits normal effort. Bilateral breast exam is deferred.    ECOG = ***  0 - Asymptomatic (Fully active, able to carry on all predisease activities without restriction)  1 - Symptomatic but completely ambulatory (Restricted in physically strenuous activity but ambulatory and able to carry out work of a light or sedentary nature. For example, light housework, office work)  2 - Symptomatic, <50% in bed during the day (Ambulatory and capable of all self care but unable to carry out any work activities. Up and about more than 50% of waking hours)  3 - Symptomatic, >50% in bed, but not bedbound (Capable of only limited self-care, confined to bed or chair 50% or more of waking hours)  4 - Bedbound (Completely disabled. Cannot carry on any self-care. Totally confined to bed or chair)  5 - Death   Eustace Pen MM, Creech RH, Tormey DC, et al. [id number]). "Toxicity and response criteria of the [facility]". [place] Oncol. 5 (6): 649-55    LABORATORY DATA:  Lab Results  Component Value Date   WBC 4.5 10/03/2021   HGB 13.1 10/03/2021   HCT 37.9 10/03/2021   MCV 89.0 10/03/2021   PLT 299 10/03/2021   Lab Results  Component Value Date   NA 141 10/03/2021   K 4.6 10/03/2021   CL 105 10/03/2021   CO2 24 10/03/2021   Lab Results  Component Value Date   ALT 20 10/03/2021   AST 21 10/03/2021   ALKPHOS 87 12/22/2016   BILITOT 0.7 10/03/2021      RADIOGRAPHY: No results found.     IMPRESSION/PLAN: 1. 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 Dr. Lisbeth Renshaw discusses the pathology findings and reviews the nature of noninvasive left breast disease.  The consensus from the breast conference includes breast conservation with lumpectomy. Dr. Lisbeth Renshaw recommends external radiotherapy to the breast  to reduce risks of local recurrence followed by antiestrogen therapy. We discussed the risks, benefits, short, and long term effects of radiotherapy, as well as the curative intent, and the patient is interested in proceeding. Dr. Lisbeth Renshaw discusses the delivery and logistics of radiotherapy and anticipates a course of 4 weeks of radiotherapy to the right breast. We will see her back a few weeks after surgery to discuss the simulation process and anticipate we starting radiotherapy about 4-6 weeks after surgery.  2. Possible genetic  predisposition to malignancy. The patient is a candidate for genetic testing given *** personal and family history. She will meet with our geneticist today in clinic.   In a visit lasting *** minutes, greater than 50% of the time was spent face to face reviewing her case, as well as in preparation of, discussing, and coordinating the patient's care.  The above documentation reflects my direct findings during this shared patient visit. Please see the separate note by Dr. Lisbeth Renshaw on this date for the remainder of the patient's plan of care.    Carola Rhine, [facility]    **Disclaimer: This note was dictated with voice recognition software. Similar sounding words can inadvertently be transcribed and this note may contain transcription errors which may not have been corrected upon publication of note.**

## 2022-06-04 ENCOUNTER — Ambulatory Visit
Admission: RE | Admit: 2022-06-04 | Discharge: 2022-06-04 | Disposition: A | Discharge status: Home / Self Care | Payer: BC Managed Care – PPO | Source: Ambulatory Visit | Attending: Radiation Oncology | Admitting: Radiation Oncology

## 2022-06-04 ENCOUNTER — Ambulatory Visit: Payer: BC Managed Care – PPO | Admitting: Physical Therapy

## 2022-06-04 ENCOUNTER — Ambulatory Visit: Payer: Self-pay | Admitting: Surgery

## 2022-06-04 ENCOUNTER — Encounter: Payer: Self-pay | Admitting: Hematology and Oncology

## 2022-06-04 ENCOUNTER — Inpatient Hospital Stay: Payer: BC Managed Care – PPO | Admitting: Licensed Clinical Social Worker

## 2022-06-04 ENCOUNTER — Inpatient Hospital Stay: Payer: BC Managed Care – PPO

## 2022-06-04 ENCOUNTER — Encounter: Payer: Self-pay | Admitting: *Deleted

## 2022-06-04 ENCOUNTER — Telehealth: Payer: Self-pay | Admitting: Genetic Counselor

## 2022-06-04 ENCOUNTER — Inpatient Hospital Stay: Payer: BC Managed Care – PPO | Attending: Hematology and Oncology | Admitting: Hematology and Oncology

## 2022-06-04 VITALS — BP 167/91 | HR 98 | Temp 98.1°F | Resp 18 | Ht 66.0 in | Wt 180.1 lb

## 2022-06-04 DIAGNOSIS — C50811 Malignant neoplasm of overlapping sites of right female breast: Secondary | ICD-10-CM

## 2022-06-04 DIAGNOSIS — D0511 Intraductal carcinoma in situ of right breast: Secondary | ICD-10-CM

## 2022-06-04 DIAGNOSIS — K219 Gastro-esophageal reflux disease without esophagitis: Secondary | ICD-10-CM | POA: Diagnosis not present

## 2022-06-04 DIAGNOSIS — Z803 Family history of malignant neoplasm of breast: Secondary | ICD-10-CM | POA: Diagnosis not present

## 2022-06-04 DIAGNOSIS — Z170 Estrogen receptor positive status [ER+]: Secondary | ICD-10-CM | POA: Diagnosis not present

## 2022-06-04 DIAGNOSIS — M1990 Unspecified osteoarthritis, unspecified site: Secondary | ICD-10-CM | POA: Diagnosis not present

## 2022-06-04 LAB — CBC WITH DIFFERENTIAL (CANCER CENTER ONLY)
Abs Immature Granulocytes: 0.01 10*3/uL (ref 0.00–0.07)
Basophils Absolute: 0 10*3/uL (ref 0.0–0.1)
Basophils Relative: 1 %
Eosinophils Absolute: 0.1 10*3/uL (ref 0.0–0.5)
Eosinophils Relative: 3 %
HCT: 36.9 % (ref 36.0–46.0)
Hemoglobin: 12.7 g/dL (ref 12.0–15.0)
Immature Granulocytes: 0 %
Lymphocytes Relative: 29 %
Lymphs Abs: 1.1 10*3/uL (ref 0.7–4.0)
MCH: 30.1 pg (ref 26.0–34.0)
MCHC: 34.4 g/dL (ref 30.0–36.0)
MCV: 87.4 fL (ref 80.0–100.0)
Monocytes Absolute: 0.4 10*3/uL (ref 0.1–1.0)
Monocytes Relative: 10 %
Neutro Abs: 2.2 10*3/uL (ref 1.7–7.7)
Neutrophils Relative %: 57 %
Platelet Count: 243 10*3/uL (ref 150–400)
RBC: 4.22 MIL/uL (ref 3.87–5.11)
RDW: 12.3 % (ref 11.5–15.5)
WBC Count: 3.9 10*3/uL — ABNORMAL LOW (ref 4.0–10.5)
nRBC: 0 % (ref 0.0–0.2)

## 2022-06-04 LAB — CMP (CANCER CENTER ONLY)
ALT: 17 U/L (ref 0–44)
AST: 16 U/L (ref 15–41)
Albumin: 4 g/dL (ref 3.5–5.0)
Alkaline Phosphatase: 81 U/L (ref 38–126)
Anion gap: 5 (ref 5–15)
BUN: 15 mg/dL (ref 8–23)
CO2: 30 mmol/L (ref 22–32)
Calcium: 9.3 mg/dL (ref 8.9–10.3)
Chloride: 107 mmol/L (ref 98–111)
Creatinine: 0.85 mg/dL (ref 0.44–1.00)
GFR, Estimated: >60 mL/min (ref >60)
Glucose, Bld: 100 mg/dL — ABNORMAL HIGH (ref 70–99)
Potassium: 3.9 mmol/L (ref 3.5–5.1)
Sodium: 142 mmol/L (ref 135–145)
Total Bilirubin: 0.6 mg/dL (ref 0.3–1.2)
Total Protein: 7 g/dL (ref 6.5–8.1)

## 2022-06-04 LAB — GENETIC SCREENING ORDER

## 2022-06-04 NOTE — Progress Notes (Signed)
[place] NOTE  Patient Care Team: Susy Frizzle, MD as PCP - General (Family Medicine) Mauro Kaufmann, RN as Oncology Nurse Navigator Rockwell Germany, RN as Oncology Nurse Navigator Benay Pike, MD as Consulting Physician (Hematology and Oncology) Donnie Mesa, MD as Consulting Physician (General Surgery) Kyung Rudd, MD as Consulting Physician (Radiation Oncology)  CHIEF COMPLAINTS/PURPOSE OF CONSULTATION:  Newly diagnosed breast cancer  HISTORY OF PRESENTING ILLNESS:  Madison Bryant 67 y.o. female is here because of recent diagnosis of right breast cancer  I reviewed her records extensively and collaborated the history with the patient.  SUMMARY OF ONCOLOGIC HISTORY: Oncology History  Malignant neoplasm of overlapping sites of right female breast ([place])  05/15/2022 Mammogram   Screening mammogram showed possible focal asymmetry in the right breast which is indeterminate.  Additional views with possible ultrasound are recommended.  Diagnostic mammogram and ultrasound followed.  Ultrasound showed 1.3 x 1.6 x 1.5 cm irregular mass in the right breast suspicious for malignancy.   05/23/2022 Pathology Results   Pathology from the right breast needle core biopsy at 12:00 2 cm from the nipple shows intermediate grade DCIS, cribriform type, necrosis and calcs present.  Prognostic showed ER 100% positive strong staining PR 100% positive strong staining   06/02/2022 Initial Diagnosis   Malignant neoplasm of overlapping sites of right female breast [facility])   Ductal carcinoma in situ (DCIS) of right breast  06/02/2022 Initial Diagnosis   Ductal carcinoma in situ (DCIS) of right breast    This is a very pleasant 67 year old postmenopausal female patient who is newly diagnosed with right breast DCIS referred to breast [place] for recommendations.  She arrived to the appointment today with her husband.  She has no family history of breast cancer.  She had 2 sons,  used birth control for short time, no history of hormone replacement.  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  She now has a 15-year-old granddaughter and a 3-year-old grandson.  Rest of the pertinent 10 point ROS reviewed and negative  MEDICAL HISTORY:  Past Medical History:  Diagnosis Date   Allergy    seasonal, pollen, dust, cedar   Ankle fracture    left   Arthritis    fingers, knees and hips   Asthma    Cellulitis    left foot   GERD (gastroesophageal reflux disease)    OTC med prn   PONV (postoperative nausea and vomiting)    Wears glasses     SURGICAL HISTORY: Past Surgical History:  Procedure Laterality Date   ABDOMINAL HYSTERECTOMY     COLONOSCOPY  2006   I & D EXTREMITY Left 10/09/2017   Procedure: IRRIGATION AND DEBRIDEMENT LEFT FOOT;  Surgeon: Leandrew Koyanagi, MD;  Location: [place];  Service: Orthopedics;  Laterality: Left;   ORIF FIBULA FRACTURE Left 08/21/2016   Procedure: OPEN REDUCTION INTERNAL FIXATION (ORIF) DISTAL FIBULA FRACTURE;  Surgeon: Ninetta Lights, MD;  Location: [place];  Service: Orthopedics;  Laterality: Left;   ROTATOR CUFF REPAIR Right    TONSILLECTOMY     TUBAL LIGATION      SOCIAL HISTORY: Social History   Socioeconomic History   Marital status: Married    Spouse name: Not on file   Number of children: Not on file   Years of education: Not on file   Highest education level: Not on file  Occupational History   Not on file  Tobacco Use  Smoking status: Never   Smokeless tobacco: Never  Vaping Use   Vaping Use: Never used  Substance and Sexual Activity   Alcohol use: No   Drug use: No   Sexual activity: Yes  Other Topics Concern   Not on file  Social History Narrative   Not on file   Social Determinants of Health   Financial Resource Strain: Not on file  Food Insecurity: Not on file  Transportation Needs: Not on file  Physical Activity: Not on file  Stress: Not on  file  Social Connections: Not on file  Intimate Partner Violence: Not on file    FAMILY HISTORY: Family History  Problem Relation Age of Onset   Miscarriages / [place] Mother     ALLERGIES:  is allergic to erythromycin.  MEDICATIONS:  Current Outpatient Medications  Medication Sig Dispense Refill   acetaminophen (TYLENOL) 325 MG tablet Take 650 mg by mouth every 6 (six) hours as needed.     Azelastine HCl 137 MCG/SPRAY SOLN Place into both nostrils.     Cyanocobalamin (VITAMIN B 12 PO) Take by mouth.     ketoconazole (NIZORAL) 2 % shampoo SMARTSIG:sparingly Topical Twice a Week     Multiple Vitamins-Minerals (MULTIVITAMIN ADULTS 50+) TABS Take by mouth.     vitamin C (ASCORBIC ACID) 500 MG tablet Take 500 mg by mouth daily.     VITAMIN D, CHOLECALCIFEROL, PO Take by mouth.     XIIDRA 5 % SOLN      No current facility-administered medications for this visit.    REVIEW OF SYSTEMS:   Constitutional: Denies fevers, chills or abnormal night sweats Eyes: Denies blurriness of vision, double vision or watery eyes Ears, nose, mouth, throat, and face: Denies mucositis or sore throat Respiratory: Denies cough, dyspnea or wheezes Cardiovascular: Denies palpitation, chest discomfort or lower extremity swelling Gastrointestinal:  Denies nausea, heartburn or change in bowel habits Skin: Denies abnormal skin rashes Lymphatics: Denies new lymphadenopathy or easy bruising Neurological:Denies numbness, tingling or new weaknesses Behavioral/Psych: Mood is stable, no new changes  Breast: Denies any palpable lumps or discharge All other systems were reviewed with the patient and are negative.  PHYSICAL EXAMINATION: ECOG PERFORMANCE STATUS: 0 - Asymptomatic  Vitals:   06/04/22 0846  BP: (!) 167/91  Pulse: 98  Resp: 18  Temp: 98.1 F (36.7 C)  SpO2: 100%   Filed Weights   06/04/22 0846  Weight: 180 lb 1.6 oz (81.7 kg)    GENERAL:alert, no distress and comfortable SKIN: skin  color, texture, turgor are normal, no rashes or significant lesions EYES: normal, conjunctiva are pink and non-injected, sclera clear OROPHARYNX:no exudate, no erythema and lips, buccal mucosa, and tongue normal  NECK: supple, thyroid normal size, non-tender, without nodularity LYMPH:  no palpable lymphadenopathy in the cervical, axillary  PSYCH: alert & oriented x 3 with fluent speech NEURO: no focal motor/sensory deficits BREAST: No palpable nodules in breast. No palpable axillary or supraclavicular lymphadenopathy   LABORATORY DATA:  I have reviewed the data as listed Lab Results  Component Value Date   WBC 3.9 (L) 06/04/2022   HGB 12.7 06/04/2022   HCT 36.9 06/04/2022   MCV 87.4 06/04/2022   PLT 243 06/04/2022   Lab Results  Component Value Date   NA 142 06/04/2022   K 3.9 06/04/2022   CL 107 06/04/2022   CO2 30 06/04/2022    RADIOGRAPHIC STUDIES: I have personally reviewed the radiological reports and agreed with the findings in the report.  ASSESSMENT  AND PLAN:  Ductal carcinoma in situ (DCIS) of right breast This is a very pleasant 67 year old postmenopausal female patient with newly diagnosed right breast intermediate grade DCIS, ER/PR positive referred to breast [place] for recommendations.  We have discussed the following details about DCIS today.  Pathology review: I discussed with the patient the difference between DCIS and invasive breast cancer. It is considered a precancerous lesion. DCIS is classified as a Stage 0 breast cancer. It is generally detected through mammograms as calcifications. We discussed the significance of grades and its impact on prognosis. We also discussed the importance of ER and PR receptors and their implications to adjuvant treatment options. Prognosis of DCIS dependence on grade and degree of comedo necrosis. It is anticipated that if not treated, 20-30% of DCIS can develop into invasive breast cancer.  Recommendation: 1. Breast conserving  surgery 2. Followed by adjuvant radiation therapy 3. Followed by antiestrogen therapy with tamoxifen/aromatase inhibitors based on menopausal status 5 years  Tamoxifen counseling: We discussed the risks and benefits of tamoxifen. These include but not limited to insomnia, hot flashes, mood changes, vaginal dryness, and weight gain. Although rare, serious side effects including endometrial cancer, risk of blood clots were also discussed. We strongly believe that the benefits far outweigh the risks. Patient understands these risks and consented to starting treatment. Planned treatment duration is 5 years.  Aromatase inhibitors counseling: We have discussed the mechanism of action of aromatase inhibitors today.  We have discussed adverse effects including but not limited to menopausal symptoms, increased risk of osteoporosis and fractures, cardiovascular events, arthralgias and myalgias.  We do believe that the benefits far outweigh the risks.  Plan treatment duration of 5 years.  At this time she is leaning towards aromatase inhibitors given family history of blood clotting and since she had some mild anticardiolipin antibody positivity in the past.  She will return to clinic after radiation to initiate antiestrogen therapy.  All questions were answered to the best my knowledge.    Total time spent: 45 minutes including history, physical exam, review of records, counseling and coordination of care All questions were answered. The patient knows to call the clinic with any problems, questions or concerns.    Benay Pike, MD 06/04/22

## 2022-06-04 NOTE — H&P (View-Only) (Signed)
Breast [facility] 06/04/22 [place] [place]  Subjective   Chief Complaint: Breast cancer  History of Present Illness: Madison Bryant is a 67 y.o. female who is seen today as an office consultation at the request of Dr. Chryl Heck for evaluation of Breast Cancer .   Screening mammogram on 05/15/2022 revealed some focal asymmetry in the right breast.  Diagnostic mammogram revealed a 1.5 cm area of focal asymmetry in the right breast at 12:00 3 cm from the nipple.  Ultrasound revealed a 1.3 x 1.6 x 1.5 cm irregular mass with a lobulated margin in the right breast 12:00 located 2 cm from the nipple.  The axilla was negative.  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  Microinvasion cannot be ruled out.   Review of Systems: A complete review of systems was obtained from the patient.  I have reviewed this information and discussed as appropriate with the patient.  See HPI as well for other ROS.  Review of Systems  Constitutional: Negative.   HENT: Negative.    Eyes: Negative.   Respiratory: Negative.    Cardiovascular: Negative.   Gastrointestinal: Negative.   Genitourinary: Negative.   Musculoskeletal: Negative.   Skin: Negative.   Neurological: Negative.   Endo/Heme/Allergies: Negative.   Psychiatric/Behavioral: Negative.        Medical History: Past Medical History:  Diagnosis Date   Allergic state    Anticardiolipin antibody positive    low titer IGM   Asthma, unspecified asthma severity, unspecified whether complicated, unspecified whether persistent    Eczema, unspecified    GERD (gastroesophageal reflux disease)    Osteoarthritis     Patient Active Problem List  Diagnosis   Allergic state   Eczema, unspecified   Anticardiolipin antibody positive   Osteoarthritis   Intraductal carcinoma in situ of right breast   Depressive disorder   Anxiety   GERD (gastroesophageal reflux disease)   Malignant neoplasm of overlapping sites of right female breast (CMS-HCC)   Seasonal  allergic rhinitis    Past Surgical History:  Procedure Laterality Date   .ORIF fibula fracture Left 08/21/2016   .I&D L extremity Left 10/09/2017   ARTHROSCOPIC ROTATOR CUFF REPAIR     COLONOSCOPY N/A    de Quervain's release Left    HERNIA REPAIR     HYSTERECTOMY       Allergies  Allergen Reactions   Erythromycin Other (See Comments) and Nausea And Vomiting    GI upset  GI UPSET   Erythromycin Base Nausea    Current Outpatient Medications on File Prior to Visit  Medication Sig Dispense Refill   acetaminophen (TYLENOL) 325 MG tablet Take 325 mg by mouth as needed for Pain     amoxicillin-clavulanate (AUGMENTIN) 875-125 mg tablet Take 1 tablet by mouth every 12 (twelve) hours     azelastine (ASTELIN) 137 mcg nasal spray Place 1 spray into both nostrils 2 (two) times daily     albuterol (VENTOLIN HFA) 90 mcg/actuation inhaler Inhale 2 inhalations into the lungs every 6 (six) hours as needed for Wheezing.     aspirin 81 MG EC tablet Take 81 mg by mouth once daily.     CARTILAGE/COLLAGEN II/HYALURON (MOVE FREE ULTRA ORAL) Take 1 tablet by mouth once daily.     estradiol (ESTRACE) 2 MG tablet Take 2 mg by mouth once daily.     ibuprofen (ADVIL,MOTRIN) 200 MG tablet Take 200 mg by mouth every 6 (six) hours as needed for Pain. For joint pain  loratadine (CLARITIN) 10 mg capsule Take 10 mg by mouth once daily.     meloxicam (MOBIC) 7.5 MG tablet Take 7.5 mg by mouth once daily.     mometasone (ASMANEX TWISTHALER) 220 mcg (14 doses) Inhale 220 mcg into the lungs once daily.     No current facility-administered medications on file prior to visit.    Family History  Problem Relation Age of Onset   Stroke Sister    Clotting disorder Sister      Social History   Tobacco Use  Smoking Status Never  Smokeless Tobacco Never     Social History   Socioeconomic History   Marital status: Unknown  Tobacco Use   Smoking status: Never   Smokeless tobacco: Never  Vaping Use    Vaping Use: Never used  Substance and Sexual Activity   Alcohol use: No    Alcohol/week: 0.0 standard drinks of alcohol   Drug use: No    Objective:      Physical Exam   Constitutional:  WDWN in NAD, conversant, no obvious deformities; lying in bed comfortably Eyes:  Pupils equal, round; sclera anicteric; moist conjunctiva; no lid lag HENT:  Oral mucosa moist; good dentition  Neck:  No masses palpated, trachea midline; no thyromegaly Lungs:  CTA bilaterally; normal respiratory effort Breasts:  symmetric, no nipple changes; no palpable masses or lymphadenopathy on either side; mild ecchymosis at the superior edge of the areola. CV:  Regular rate and rhythm; no murmurs; extremities well-perfused with no edema Abd:  +bowel sounds, soft, non-tender, no palpable organomegaly; no palpable hernias Musc:  Unable to assess gait; no apparent clubbing or cyanosis in extremities Lymphatic:  No palpable cervical or axillary lymphadenopathy Skin:  Warm, dry; no sign of jaundice Psychiatric - alert and oriented x 4; calm mood and affect   Labs, Imaging and Diagnostic Testing: Diagnosis Breast, right, needle core biopsy, 12 o'clock, 2cmfn - DUCTAL CARCINOMA IN SITU, INTERMEDIATE GRADE (2), CRIBRIFORM TYPE - CANNOT RULE OUT FOCAL MICROINVASION - NECROSIS: PRESENT - CALCIFICATIONS: PRESENT - DCIS LENGTH: 1.1 CM - SEE NOTE Diagnosis Note Immunohistochemical staining for myoepithelial markers (calponin and smooth muscle myosin) is performed on block 1A. The myoepithelial markers are very focally lost however the morphologic appearance of the corresponding area on H&E stain does not appear to be invasive. Dr. Saralyn Pilar reviewed the case and agrees with the above diagnosis. A breast prognostic profile (ER and PR) is pending and will be reported in an addendum. These results were called to [facility] on 05/27/2022. Maretta Bees M.D. Pathologist, Electronic Signature (Case signed  05/27/2022)  PROGNOSTIC INDICATORS Results: IMMUNOHISTOCHEMICAL AND MORPHOMETRIC ANALYSIS PERFORMED MANUALLY Estrogen Receptor: 100%, POSITIVE, STRONG STAINING INTENSITY Progesterone Receptor: 100%, POSITIVE, STRONG STAINING INTENSITY REFERENCE RANGE ESTROGEN RECEPTOR NEGATIVE 0% POSITIVE =>1% REFERENCE RANGE PROGESTERONE RECEPTOR NEGATIVE 0% POSITIVE =>1% All controls stained appropriately Claudette Laws MD Pathologist, Electronic Signature ( Signed 05/28/2022)  Assessment and Plan:  Diagnoses and all orders for this visit:  Intraductal carcinoma in situ of right breast    I had a long discussion with the patient and her husband regarding her disease.  We also had multidisciplinary conference to discuss treatment plans.  We discussed surgical options including mastectomy versus breast conserving therapy.  She is a good candidate for breast conserving therapy.  We will plan to perform a lumpectomy.  Considering the fact that microinvasion cannot be ruled out, we will inject mag trace at the time of surgery in case she is  found to have invasive disease and needs a sentinel lymph node biopsy.  Right breast radioactive seed localized lumpectomy.  The surgical procedure has been discussed with the patient.  Potential risks, benefits, alternative treatments, and expected outcomes have been explained.  All of the patient's questions at this time have been answered.  The likelihood of reaching the patient's treatment goal is good.  The patient understand the proposed surgical procedure and wishes to proceed.  Surgical will most likely be followed with radiation and antiestrogens.  Keaton Beichner Jearld Adjutant, MD  06/04/2022 10:46 AM

## 2022-06-04 NOTE — Telephone Encounter (Signed)
Madison Bryant was seen by a genetic counselor during the breast multidisciplinary clinic on 06/04/2022. In addition to her personal history of breast cancer, she reported a family history of a paternal aunt diagnosed with colon cancer in her 6s. She does not meet NCCN criteria for genetic testing at this time. She was still offered genetic counseling and testing but declined. We encourage her to contact us if there are any changes to her personal or family history of cancer. If she meets NCCN criteria based on the updated personal/family history, she would be recommended to have genetic counseling and testing.   Madison Passy, MS, [facility] Genetic Counselor [email or web address] (P) [phone]

## 2022-06-04 NOTE — Progress Notes (Signed)
[place] Work  Initial Assessment   Madison Bryant is a 67 y.o. year old female accompanied by spouse, Konrad Dolores. Clinical Social Work was referred by  [facility]  for assessment of psychosocial needs.   SDOH (Social Determinants of Health) assessments performed: Yes SDOH Interventions    Flowsheet Row Clinical Support from 06/04/2022 in [place] at [facility] Most recent reading at 06/04/2022 11:36 AM Office Visit from 06/04/2022 in [place] at [facility] Most recent reading at 06/04/2022  8:59 AM Office Visit from 12/02/2017 in [place] Most recent reading at 12/02/2017  3:35 PM  SDOH Interventions     Food Insecurity Interventions Intervention Not Indicated -- --  Housing Interventions -- Intervention Not Indicated --  Transportation Interventions Intervention Not Indicated -- --  Utilities Interventions Intervention Not Indicated -- --  Alcohol Usage Interventions -- Intervention Not Indicated (Score <7) --  Depression Interventions/Treatment  -- -- Patient refuses Treatment  Financial Strain Interventions Intervention Not Indicated -- --       SDOH Screenings   Food Insecurity: No Food Insecurity (06/04/2022)  Housing: Low Risk  (06/04/2022)  Transportation Needs: No Transportation Needs (06/04/2022)  Utilities: Not At Risk (06/04/2022)  Alcohol Screen: Low Risk  (06/04/2022)  Depression (PHQ2-9): High Risk (04/14/2022)  Financial Resource Strain: Low Risk  (06/04/2022)  Tobacco Use: Low Risk  (06/04/2022)     Distress Screen completed: Yes    06/04/2022   11:35 AM  ONCBCN DISTRESS SCREENING  Screening Type Initial Screening  Distress experienced in past week (1-10) 8  Emotional problem type Nervousness/Anxiety;Adjusting to illness      Family/Social Information:  Housing Arrangement: patient lives with husband , 3 dogs (labs) Family members/support persons in your life? Family (two adult sons, one  2 miles away, the other in [place]), Friends, church Transportation concerns: no  Employment: Retired from school system.  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 Wants to get her dog back into therapy dog training to be able to go to schools and nursing homes Patient enjoys crafts, reading, time with family/ friends, and her dogs Current coping skills/ strengths: Capable of independent living , Armed forces logistics/support/administrative officer , Motivation for treatment/growth , Religious Affiliation , Special hobby/interest , and Supportive family/friends     SUMMARY: Current SDOH Barriers:  None noted today  Clinical Social Work Clinical Goal(s):  No clinical social work goals at this time  Interventions: Discussed common feeling and emotions when being diagnosed with cancer, and the importance of support during treatment Informed patient of the support team roles and support services at [facility] Provided [place] contact information and encouraged patient to call with any questions or concerns   Follow Up Plan: Patient will contact CSW with any support or resource needs Patient verbalizes understanding of plan: Yes    Jaikob Borgwardt E Susan Bleich, LCSW

## 2022-06-04 NOTE — Assessment & Plan Note (Addendum)
This is a very pleasant 67 year old postmenopausal female patient with newly diagnosed right breast intermediate grade DCIS, ER/PR positive referred to breast [facility] for recommendations.  We have discussed the following details about DCIS today.  Pathology review: I discussed with the patient the difference between DCIS and invasive breast cancer. It is considered a precancerous lesion. DCIS is classified as a Stage 0 breast cancer. It is generally detected through mammograms as calcifications. We discussed the significance of grades and its impact on prognosis. We also discussed the importance of ER and PR receptors and their implications to adjuvant treatment options. Prognosis of DCIS dependence on grade and degree of comedo necrosis. It is anticipated that if not treated, 20-30% of DCIS can develop into invasive breast cancer.  Recommendation: 1. Breast conserving surgery 2. Followed by adjuvant radiation therapy 3. Followed by antiestrogen therapy with tamoxifen/aromatase inhibitors based on menopausal status 5 years  Tamoxifen counseling: We discussed the risks and benefits of tamoxifen. These include but not limited to insomnia, hot flashes, mood changes, vaginal dryness, and weight gain. Although rare, serious side effects including endometrial cancer, risk of blood clots were also discussed. We strongly believe that the benefits far outweigh the risks. Patient understands these risks and consented to starting treatment. Planned treatment duration is 5 years.  Aromatase inhibitors counseling: We have discussed the mechanism of action of aromatase inhibitors today.  We have discussed adverse effects including but not limited to menopausal symptoms, increased risk of osteoporosis and fractures, cardiovascular events, arthralgias and myalgias.  We do believe that the benefits far outweigh the risks.  Plan treatment duration of 5 years.  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  She will return to clinic after radiation to initiate antiestrogen therapy.  All questions were answered to the best my knowledge.

## 2022-06-04 NOTE — H&P (Signed)
Breast [facility] 06/04/22 Iruku/ [place]  Subjective   Chief Complaint: Breast cancer  History of Present Illness: Madison Bryant is a 67 y.o. female who is seen today as an office consultation at the request of Dr. Iruku for evaluation of Breast Cancer .   Screening mammogram on 05/15/2022 revealed some focal asymmetry in the right breast.  Diagnostic mammogram revealed a 1.5 cm area of focal asymmetry in the right breast at 12:00 3 cm from the nipple.  Ultrasound revealed a 1.3 x 1.6 x 1.5 cm irregular mass with a lobulated margin in the right breast 12:00 located 2 cm from the nipple.  The axilla was negative.  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  Microinvasion cannot be ruled out.   Review of Systems: A complete review of systems was obtained from the patient.  I have reviewed this information and discussed as appropriate with the patient.  See HPI as well for other ROS.  Review of Systems  Constitutional: Negative.   HENT: Negative.    Eyes: Negative.   Respiratory: Negative.    Cardiovascular: Negative.   Gastrointestinal: Negative.   Genitourinary: Negative.   Musculoskeletal: Negative.   Skin: Negative.   Neurological: Negative.   Endo/Heme/Allergies: Negative.   Psychiatric/Behavioral: Negative.        Medical History: Past Medical History:  Diagnosis Date   Allergic state    Anticardiolipin antibody positive    low titer IGM   Asthma, unspecified asthma severity, unspecified whether complicated, unspecified whether persistent    Eczema, unspecified    GERD (gastroesophageal reflux disease)    Osteoarthritis     Patient Active Problem List  Diagnosis   Allergic state   Eczema, unspecified   Anticardiolipin antibody positive   Osteoarthritis   Intraductal carcinoma in situ of right breast   Depressive disorder   Anxiety   GERD (gastroesophageal reflux disease)   Malignant neoplasm of overlapping sites of right female breast (CMS-HCC)   Seasonal  allergic rhinitis    Past Surgical History:  Procedure Laterality Date   .ORIF fibula fracture Left 08/21/2016   .I&D L extremity Left 10/09/2017   ARTHROSCOPIC ROTATOR CUFF REPAIR     COLONOSCOPY N/A    de Quervain's release Left    HERNIA REPAIR     HYSTERECTOMY       Allergies  Allergen Reactions   Erythromycin Other (See Comments) and Nausea And Vomiting    GI upset  GI UPSET   Erythromycin Base Nausea    Current Outpatient Medications on File Prior to Visit  Medication Sig Dispense Refill   acetaminophen (TYLENOL) 325 MG tablet Take 325 mg by mouth as needed for Pain     amoxicillin-clavulanate (AUGMENTIN) 875-125 mg tablet Take 1 tablet by mouth every 12 (twelve) hours     azelastine (ASTELIN) 137 mcg nasal spray Place 1 spray into both nostrils 2 (two) times daily     albuterol (VENTOLIN HFA) 90 mcg/actuation inhaler Inhale 2 inhalations into the lungs every 6 (six) hours as needed for Wheezing.     aspirin 81 MG EC tablet Take 81 mg by mouth once daily.     CARTILAGE/COLLAGEN II/HYALURON (MOVE FREE ULTRA ORAL) Take 1 tablet by mouth once daily.     estradiol (ESTRACE) 2 MG tablet Take 2 mg by mouth once daily.     ibuprofen (ADVIL,MOTRIN) 200 MG tablet Take 200 mg by mouth every 6 (six) hours as needed for Pain. For joint pain       loratadine (CLARITIN) 10 mg capsule Take 10 mg by mouth once daily.     meloxicam (MOBIC) 7.5 MG tablet Take 7.5 mg by mouth once daily.     mometasone (ASMANEX TWISTHALER) 220 mcg (14 doses) Inhale 220 mcg into the lungs once daily.     No current facility-administered medications on file prior to visit.    Family History  Problem Relation Age of Onset   Stroke Sister    Clotting disorder Sister      Social History   Tobacco Use  Smoking Status Never  Smokeless Tobacco Never     Social History   Socioeconomic History   Marital status: Unknown  Tobacco Use   Smoking status: Never   Smokeless tobacco: Never  Vaping Use    Vaping Use: Never used  Substance and Sexual Activity   Alcohol use: No    Alcohol/week: 0.0 standard drinks of alcohol   Drug use: No    Objective:      Physical Exam   Constitutional:  WDWN in NAD, conversant, no obvious deformities; lying in bed comfortably Eyes:  Pupils equal, round; sclera anicteric; moist conjunctiva; no lid lag HENT:  Oral mucosa moist; good dentition  Neck:  No masses palpated, trachea midline; no thyromegaly Lungs:  CTA bilaterally; normal respiratory effort Breasts:  symmetric, no nipple changes; no palpable masses or lymphadenopathy on either side; mild ecchymosis at the superior edge of the areola. CV:  Regular rate and rhythm; no murmurs; extremities well-perfused with no edema Abd:  +bowel sounds, soft, non-tender, no palpable organomegaly; no palpable hernias Musc:  Unable to assess gait; no apparent clubbing or cyanosis in extremities Lymphatic:  No palpable cervical or axillary lymphadenopathy Skin:  Warm, dry; no sign of jaundice Psychiatric - alert and oriented x 4; calm mood and affect   Labs, Imaging and Diagnostic Testing: Diagnosis Breast, right, needle core biopsy, 12 o'clock, 2cmfn - DUCTAL CARCINOMA IN SITU, INTERMEDIATE GRADE (2), CRIBRIFORM TYPE - CANNOT RULE OUT FOCAL MICROINVASION - NECROSIS: PRESENT - CALCIFICATIONS: PRESENT - DCIS LENGTH: 1.1 CM - SEE NOTE Diagnosis Note Immunohistochemical staining for myoepithelial markers (calponin and smooth muscle myosin) is performed on block 1A. The myoepithelial markers are very focally lost however the morphologic appearance of the corresponding area on H&E stain does not appear to be invasive. Dr. Patrick reviewed the case and agrees with the above diagnosis. A breast prognostic profile (ER and PR) is pending and will be reported in an addendum. These results were called to [facility] on 05/27/2022. Archana P. Kanteti M.D. Pathologist, Electronic Signature (Case signed  05/27/2022)  PROGNOSTIC INDICATORS Results: IMMUNOHISTOCHEMICAL AND MORPHOMETRIC ANALYSIS PERFORMED MANUALLY Estrogen Receptor: 100%, POSITIVE, STRONG STAINING INTENSITY Progesterone Receptor: 100%, POSITIVE, STRONG STAINING INTENSITY REFERENCE RANGE ESTROGEN RECEPTOR NEGATIVE 0% POSITIVE =>1% REFERENCE RANGE PROGESTERONE RECEPTOR NEGATIVE 0% POSITIVE =>1% All controls stained appropriately JOHN PATRICK MD Pathologist, Electronic Signature ( Signed 05/28/2022)  Assessment and Plan:  Diagnoses and all orders for this visit:  Intraductal carcinoma in situ of right breast    I had a long discussion with the patient and her husband regarding her disease.  We also had multidisciplinary conference to discuss treatment plans.  We discussed surgical options including mastectomy versus breast conserving therapy.  She is a good candidate for breast conserving therapy.  We will plan to perform a lumpectomy.  Considering the fact that microinvasion cannot be ruled out, we will inject mag trace at the time of surgery in case she is   found to have invasive disease and needs a sentinel lymph node biopsy.  Right breast radioactive seed localized lumpectomy.  The surgical procedure has been discussed with the patient.  Potential risks, benefits, alternative treatments, and expected outcomes have been explained.  All of the patient's questions at this time have been answered.  The likelihood of reaching the patient's treatment goal is good.  The patient understand the proposed surgical procedure and wishes to proceed.  Surgical will most likely be followed with radiation and antiestrogens.  Madison Moffa KAI Lakoda Raske, MD  06/04/2022 10:46 AM 

## 2022-06-06 ENCOUNTER — Encounter: Payer: Self-pay | Admitting: Family Medicine

## 2022-06-09 ENCOUNTER — Encounter: Payer: Self-pay | Admitting: Radiation Oncology

## 2022-06-12 ENCOUNTER — Encounter: Payer: Self-pay | Admitting: *Deleted

## 2022-06-12 ENCOUNTER — Encounter (HOSPITAL_BASED_OUTPATIENT_CLINIC_OR_DEPARTMENT_OTHER): Payer: Self-pay | Admitting: Surgery

## 2022-06-12 ENCOUNTER — Telehealth: Payer: Self-pay | Admitting: *Deleted

## 2022-06-12 ENCOUNTER — Other Ambulatory Visit: Payer: Self-pay

## 2022-06-12 DIAGNOSIS — D0511 Intraductal carcinoma in situ of right breast: Secondary | ICD-10-CM

## 2022-06-12 NOTE — Telephone Encounter (Signed)
Spoke with patient to follow up from [facility] 1/24 and assess navigation needs. Patient denies any questions or concerns at this time. Encouraged her to call should anything arise. Patient verbalized understanding.

## 2022-06-16 ENCOUNTER — Ambulatory Visit
Admission: RE | Admit: 2022-06-16 | Discharge: 2022-06-16 | Disposition: A | Discharge status: Home / Self Care | Payer: Self-pay | Source: Ambulatory Visit | Attending: Radiation Oncology | Admitting: Radiation Oncology

## 2022-06-16 ENCOUNTER — Inpatient Hospital Stay
Admission: RE | Admit: 2022-06-16 | Discharge: 2022-06-16 | Disposition: A | Discharge status: Home / Self Care | Payer: Self-pay | Source: Ambulatory Visit | Attending: Radiation Oncology | Admitting: Radiation Oncology

## 2022-06-16 ENCOUNTER — Other Ambulatory Visit: Payer: Self-pay | Admitting: Radiation Oncology

## 2022-06-16 DIAGNOSIS — D0511 Intraductal carcinoma in situ of right breast: Secondary | ICD-10-CM

## 2022-06-19 NOTE — Anesthesia Preprocedure Evaluation (Addendum)
Anesthesia Evaluation  Patient identified by MRN, date of birth, ID band Patient awake    Reviewed: Allergy & Precautions, H&P , NPO status , Patient's Chart, lab work & pertinent test results  History of Anesthesia Complications (+) PONV and history of anesthetic complications  Airway Mallampati: III  TM Distance: >3 FB Neck ROM: Full    Dental no notable dental hx. (+) Teeth Intact, Dental Advisory Given   Pulmonary neg pulmonary ROS, asthma    Pulmonary exam normal breath sounds clear to auscultation       Cardiovascular Exercise Tolerance: Good negative cardio ROS  Rhythm:Regular Rate:Normal     Neuro/Psych   Anxiety     negative neurological ROS  negative psych ROS   GI/Hepatic Neg liver ROS,GERD  ,,  Endo/Other  negative endocrine ROS    Renal/GU negative Renal ROS  negative genitourinary   Musculoskeletal  (+) Arthritis , Osteoarthritis,    Abdominal   Peds  Hematology negative hematology ROS (+)   Anesthesia Other Findings   Reproductive/Obstetrics negative OB ROS                             Anesthesia Physical Anesthesia Plan  ASA: 2  Anesthesia Plan: General   Post-op Pain Management: Tylenol PO (pre-op)* and Toradol IV (intra-op)*   Induction: Intravenous  PONV Risk Score and Plan: 4 or greater and Ondansetron, Dexamethasone, Propofol infusion, TIVA and Midazolam  Airway Management Planned: LMA  Additional Equipment:   Intra-op Plan:   Post-operative Plan: Extubation in OR  Informed Consent: I have reviewed the patients History and Physical, chart, labs and discussed the procedure including the risks, benefits and alternatives for the proposed anesthesia with the patient or authorized representative who has indicated his/her understanding and acceptance.     Dental advisory given  Plan Discussed with: CRNA  Anesthesia Plan Comments:         Anesthesia Quick Evaluation

## 2022-06-20 ENCOUNTER — Other Ambulatory Visit: Payer: Self-pay

## 2022-06-20 ENCOUNTER — Encounter (HOSPITAL_BASED_OUTPATIENT_CLINIC_OR_DEPARTMENT_OTHER)
Admission: RE | Disposition: A | Discharge status: Home / Self Care | Payer: Self-pay | Source: Home / Self Care | Attending: Surgery

## 2022-06-20 ENCOUNTER — Ambulatory Visit (HOSPITAL_BASED_OUTPATIENT_CLINIC_OR_DEPARTMENT_OTHER)
Admission: RE | Admit: 2022-06-20 | Discharge: 2022-06-20 | Disposition: A | Discharge status: Home / Self Care | Payer: BC Managed Care – PPO | Attending: Surgery | Admitting: Surgery

## 2022-06-20 ENCOUNTER — Encounter: Payer: Self-pay | Admitting: *Deleted

## 2022-06-20 ENCOUNTER — Encounter (HOSPITAL_BASED_OUTPATIENT_CLINIC_OR_DEPARTMENT_OTHER): Payer: Self-pay | Admitting: Surgery

## 2022-06-20 ENCOUNTER — Ambulatory Visit (HOSPITAL_BASED_OUTPATIENT_CLINIC_OR_DEPARTMENT_OTHER): Payer: BC Managed Care – PPO | Admitting: Anesthesiology

## 2022-06-20 DIAGNOSIS — D0511 Intraductal carcinoma in situ of right breast: Secondary | ICD-10-CM | POA: Insufficient documentation

## 2022-06-20 DIAGNOSIS — Z791 Long term (current) use of non-steroidal anti-inflammatories (NSAID): Secondary | ICD-10-CM | POA: Diagnosis not present

## 2022-06-20 DIAGNOSIS — F32A Depression, unspecified: Secondary | ICD-10-CM | POA: Diagnosis not present

## 2022-06-20 DIAGNOSIS — J45909 Unspecified asthma, uncomplicated: Secondary | ICD-10-CM | POA: Insufficient documentation

## 2022-06-20 DIAGNOSIS — Z170 Estrogen receptor positive status [ER+]: Secondary | ICD-10-CM | POA: Insufficient documentation

## 2022-06-20 DIAGNOSIS — Z01818 Encounter for other preprocedural examination: Secondary | ICD-10-CM

## 2022-06-20 DIAGNOSIS — Z7982 Long term (current) use of aspirin: Secondary | ICD-10-CM | POA: Diagnosis not present

## 2022-06-20 DIAGNOSIS — F419 Anxiety disorder, unspecified: Secondary | ICD-10-CM | POA: Insufficient documentation

## 2022-06-20 DIAGNOSIS — Z7951 Long term (current) use of inhaled steroids: Secondary | ICD-10-CM | POA: Diagnosis not present

## 2022-06-20 DIAGNOSIS — M1990 Unspecified osteoarthritis, unspecified site: Secondary | ICD-10-CM | POA: Diagnosis not present

## 2022-06-20 DIAGNOSIS — K219 Gastro-esophageal reflux disease without esophagitis: Secondary | ICD-10-CM | POA: Insufficient documentation

## 2022-06-20 HISTORY — DX: Family history of other specified conditions: Z84.89

## 2022-06-20 HISTORY — PX: BREAST LUMPECTOMY WITH RADIOACTIVE SEED LOCALIZATION: SHX6424

## 2022-06-20 SURGERY — BREAST LUMPECTOMY WITH RADIOACTIVE SEED LOCALIZATION
Anesthesia: General | Site: Breast | Laterality: Right

## 2022-06-20 MED ORDER — PROPOFOL 10 MG/ML IV BOLUS
INTRAVENOUS | Status: AC
  Filled 2022-06-20: qty 20

## 2022-06-20 MED ORDER — ONDANSETRON HCL 4 MG/2ML IJ SOLN
INTRAMUSCULAR | Status: DC | PRN
  Administered 2022-06-20: 4 mg via INTRAVENOUS

## 2022-06-20 MED ORDER — PROPOFOL 500 MG/50ML IV EMUL
INTRAVENOUS | Status: DC | PRN
  Administered 2022-06-20: 200 ug/kg/min via INTRAVENOUS

## 2022-06-20 MED ORDER — CEFAZOLIN SODIUM-DEXTROSE 2-4 GM/100ML-% IV SOLN: 2.0000 g | INTRAVENOUS | Status: DC

## 2022-06-20 MED ORDER — MIDAZOLAM HCL 2 MG/2ML IJ SOLN
INTRAMUSCULAR | Status: DC | PRN
  Administered 2022-06-20: 2 mg via INTRAVENOUS

## 2022-06-20 MED ORDER — LIDOCAINE HCL (CARDIAC) PF 100 MG/5ML IV SOSY
PREFILLED_SYRINGE | INTRAVENOUS | Status: DC | PRN
  Administered 2022-06-20: 60 mg via INTRAVENOUS

## 2022-06-20 MED ORDER — BUPIVACAINE HCL (PF) 0.25 % IJ SOLN
INTRAMUSCULAR | Status: AC
  Filled 2022-06-20: qty 30

## 2022-06-20 MED ORDER — LIDOCAINE 2% (20 MG/ML) 5 ML SYRINGE
INTRAMUSCULAR | Status: AC
  Filled 2022-06-20: qty 5

## 2022-06-20 MED ORDER — CHLORHEXIDINE GLUCONATE CLOTH 2 % EX PADS: 6.0000 | MEDICATED_PAD | Freq: Once | CUTANEOUS | Status: DC

## 2022-06-20 MED ORDER — MAGTRACE LYMPHATIC TRACER
INTRAMUSCULAR | Status: DC | PRN
  Administered 2022-06-20: 2 mL via INTRAMUSCULAR

## 2022-06-20 MED ORDER — PROPOFOL 10 MG/ML IV BOLUS
INTRAVENOUS | Status: DC | PRN
  Administered 2022-06-20: 150 mg via INTRAVENOUS

## 2022-06-20 MED ORDER — FENTANYL CITRATE (PF) 100 MCG/2ML IJ SOLN
INTRAMUSCULAR | Status: AC
  Filled 2022-06-20: qty 2

## 2022-06-20 MED ORDER — BUPIVACAINE HCL (PF) 0.25 % IJ SOLN
INTRAMUSCULAR | Status: DC | PRN
  Administered 2022-06-20: 10 mL

## 2022-06-20 MED ORDER — FENTANYL CITRATE (PF) 100 MCG/2ML IJ SOLN
25.0000 ug | INTRAMUSCULAR | Status: DC | PRN
  Administered 2022-06-20 (×2): 50 ug via INTRAVENOUS

## 2022-06-20 MED ORDER — ONDANSETRON HCL 4 MG/2ML IJ SOLN
INTRAMUSCULAR | Status: AC
  Filled 2022-06-20: qty 2

## 2022-06-20 MED ORDER — LACTATED RINGERS IV SOLN: INTRAVENOUS | Status: DC | PRN

## 2022-06-20 MED ORDER — KETOROLAC TROMETHAMINE 30 MG/ML IJ SOLN
INTRAMUSCULAR | Status: DC | PRN
  Administered 2022-06-20: 30 mg via INTRAVENOUS

## 2022-06-20 MED ORDER — LACTATED RINGERS IV SOLN: INTRAVENOUS | Status: DC

## 2022-06-20 MED ORDER — DEXAMETHASONE SODIUM PHOSPHATE 10 MG/ML IJ SOLN
INTRAMUSCULAR | Status: DC | PRN
  Administered 2022-06-20: 10 mg via INTRAVENOUS

## 2022-06-20 MED ORDER — EPINEPHRINE PF 1 MG/ML IJ SOLN
INTRAMUSCULAR | Status: AC
  Filled 2022-06-20: qty 1

## 2022-06-20 MED ORDER — DEXAMETHASONE SODIUM PHOSPHATE 10 MG/ML IJ SOLN
INTRAMUSCULAR | Status: AC
  Filled 2022-06-20: qty 1

## 2022-06-20 MED ORDER — FENTANYL CITRATE (PF) 100 MCG/2ML IJ SOLN
INTRAMUSCULAR | Status: DC | PRN
  Administered 2022-06-20 (×2): 50 ug via INTRAVENOUS

## 2022-06-20 MED ORDER — MIDAZOLAM HCL 2 MG/2ML IJ SOLN
INTRAMUSCULAR | Status: AC
  Filled 2022-06-20: qty 2

## 2022-06-20 MED ORDER — ACETAMINOPHEN 500 MG PO TABS
1000.0000 mg | ORAL_TABLET | ORAL | Status: AC
  Administered 2022-06-20: 1000 mg via ORAL

## 2022-06-20 MED ORDER — ACETAMINOPHEN 500 MG PO TABS
ORAL_TABLET | ORAL | Status: AC
  Filled 2022-06-20: qty 2

## 2022-06-20 MED ORDER — CEFAZOLIN SODIUM-DEXTROSE 2-4 GM/100ML-% IV SOLN
INTRAVENOUS | Status: AC
  Filled 2022-06-20: qty 100

## 2022-06-20 SURGICAL SUPPLY — 52 items
APL PRP STRL LF DISP 70% ISPRP (MISCELLANEOUS) ×1
APL SKNCLS STERI-STRIP NONHPOA (GAUZE/BANDAGES/DRESSINGS) ×1
APPLIER CLIP 9.375 MED OPEN (MISCELLANEOUS) ×1
APR CLP MED 9.3 20 MLT OPN (MISCELLANEOUS) ×1
BENZOIN TINCTURE PRP APPL 2/3 (GAUZE/BANDAGES/DRESSINGS) ×1 IMPLANT
BLADE HEX COATED 2.75 (ELECTRODE) ×1 IMPLANT
BLADE SURG 15 STRL LF DISP TIS (BLADE) ×1 IMPLANT
BLADE SURG 15 STRL SS (BLADE) ×1
CANISTER SUC SOCK COL 7IN (MISCELLANEOUS) IMPLANT
CANISTER SUCT 1200ML W/VALVE (MISCELLANEOUS) IMPLANT
CHLORAPREP W/TINT 26 (MISCELLANEOUS) ×1 IMPLANT
CLIP APPLIE 9.375 MED OPEN (MISCELLANEOUS) ×1 IMPLANT
COVER BACK TABLE 60X90IN (DRAPES) ×1 IMPLANT
COVER MAYO STAND STRL (DRAPES) ×1 IMPLANT
COVER PROBE CYLINDRICAL 5X96 (MISCELLANEOUS) ×1 IMPLANT
DRAPE LAPAROTOMY 100X72 PEDS (DRAPES) ×1 IMPLANT
DRAPE UTILITY XL STRL (DRAPES) ×1 IMPLANT
DRSG TEGADERM 4X4.75 (GAUZE/BANDAGES/DRESSINGS) ×1 IMPLANT
ELECT REM PT RETURN 9FT ADLT (ELECTROSURGICAL) ×1
ELECTRODE REM PT RTRN 9FT ADLT (ELECTROSURGICAL) ×1 IMPLANT
GAUZE SPONGE 4X4 12PLY STRL LF (GAUZE/BANDAGES/DRESSINGS) IMPLANT
GLOVE BIO SURGEON STRL SZ7 (GLOVE) ×1 IMPLANT
GLOVE BIOGEL PI IND STRL 7.5 (GLOVE) ×1 IMPLANT
GOWN STRL REUS W/ TWL LRG LVL3 (GOWN DISPOSABLE) ×2 IMPLANT
GOWN STRL REUS W/TWL LRG LVL3 (GOWN DISPOSABLE) ×2
ILLUMINATOR WAVEGUIDE N/F (MISCELLANEOUS) IMPLANT
KIT MARKER MARGIN INK (KITS) ×1 IMPLANT
LIGHT WAVEGUIDE WIDE FLAT (MISCELLANEOUS) IMPLANT
NDL HYPO 25X1 1.5 SAFETY (NEEDLE) ×1 IMPLANT
NEEDLE HYPO 25X1 1.5 SAFETY (NEEDLE) ×1 IMPLANT
NEEDLE HYPO ECLIPSE 25G X 1.5 (MISCELLANEOUS) IMPLANT
NS IRRIG 1000ML POUR BTL (IV SOLUTION) ×1 IMPLANT
PACK BASIN DAY SURGERY FS (CUSTOM PROCEDURE TRAY) ×1 IMPLANT
PENCIL SMOKE EVACUATOR (MISCELLANEOUS) ×1 IMPLANT
SLEEVE SCD COMPRESS KNEE MED (STOCKING) ×1 IMPLANT
SPIKE FLUID TRANSFER (MISCELLANEOUS) IMPLANT
SPONGE GAUZE 2X2 8PLY STRL LF (GAUZE/BANDAGES/DRESSINGS) IMPLANT
SPONGE T-LAP 18X18 ~~LOC~~+RFID (SPONGE) IMPLANT
SPONGE T-LAP 4X18 ~~LOC~~+RFID (SPONGE) ×1 IMPLANT
STRIP CLOSURE SKIN 1/2X4 (GAUZE/BANDAGES/DRESSINGS) ×1 IMPLANT
SUT MON AB 4-0 PC3 18 (SUTURE) ×1 IMPLANT
SUT SILK 2 0 SH (SUTURE) IMPLANT
SUT VIC AB 3-0 SH 27 (SUTURE) ×1
SUT VIC AB 3-0 SH 27X BRD (SUTURE) ×1 IMPLANT
SYR 3ML 18GX1 1/2 (SYRINGE) IMPLANT
SYR BULB EAR ULCER 3OZ GRN STR (SYRINGE) IMPLANT
SYR CONTROL 10ML LL (SYRINGE) ×1 IMPLANT
TOWEL GREEN STERILE FF (TOWEL DISPOSABLE) ×1 IMPLANT
TRACER MAGTRACE VIAL (MISCELLANEOUS) IMPLANT
TRAY FAXITRON CT DISP (TRAY / TRAY PROCEDURE) ×1 IMPLANT
TUBE CONNECTING 20X1/4 (TUBING) IMPLANT
YANKAUER SUCT BULB TIP NO VENT (SUCTIONS) IMPLANT

## 2022-06-20 NOTE — Transfer of Care (Signed)
Immediate Anesthesia Transfer of Care Note  Patient: Madison Bryant  Procedure(s) Performed: RIGHT BREAST LUMPECTOMY WITH RADIOACTIVE SEED LOCALIZATION (Right: Breast)  Patient Location: PACU  Anesthesia Type:General  Level of Consciousness: awake, alert , and oriented  Airway & Oxygen Therapy: Patient Spontanous Breathing and Patient connected to face mask oxygen  Post-op Assessment: Report given to RN and Post -op Vital signs reviewed and stable  Post vital signs: Reviewed and stable  Last Vitals:  Vitals Value Taken Time  BP 110/74 06/20/22 0828  Temp 36.5 C 06/20/22 0828  Pulse 79 06/20/22 0828  Resp 17 06/20/22 0828  SpO2 97 % 06/20/22 0828  Vitals shown include unvalidated device data.  Last Pain:  Vitals:   06/20/22 0628  TempSrc: Oral  PainSc: 0-No pain      Patients Stated Pain Goal: 4 ([id number])  Complications: No notable events documented.

## 2022-06-20 NOTE — Anesthesia Procedure Notes (Signed)
Procedure Name: LMA Insertion Date/Time: 06/20/2022 7:39 AM  Performed by: Verita Lamb, CRNAPre-anesthesia Checklist: Patient identified, Emergency Drugs available, Suction available and Patient being monitored Patient Re-evaluated:Patient Re-evaluated prior to induction Oxygen Delivery Method: Circle system utilized Preoxygenation: Pre-oxygenation with 100% oxygen Induction Type: IV induction Ventilation: Mask ventilation without difficulty LMA: LMA inserted LMA Size: 4.0 Number of attempts: 1 Airway Equipment and Method: Bite block Placement Confirmation: positive ETCO2, CO2 detector and breath sounds checked- equal and bilateral Tube secured with: Tape Dental Injury: Teeth and Oropharynx as per pre-operative assessment

## 2022-06-20 NOTE — Op Note (Signed)
Pre-op Diagnosis:  Right breast Ductal carcinoma in situ Post-op Diagnosis: same Procedure:  Right radioactive seed localized lumpectomy Surgeon:  Deyanna Mctier K. Anesthesia:  GEN - LMA Indications:  Madison Bryant is a 67 y.o. female who is seen today as an office consultation at the request of Dr. Chryl Heck for evaluation of Breast Cancer .   Screening mammogram on 05/15/2022 revealed some focal asymmetry in the right breast.  Diagnostic mammogram revealed a 1.5 cm area of focal asymmetry in the right breast at 12:00 3 cm from the nipple.  Ultrasound revealed a 1.3 x 1.6 x 1.5 cm irregular mass with a lobulated margin in the right breast 12:00 located 2 cm from the nipple.  The axilla was negative.  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   Microinvasion cannot be ruled out.  Description of procedure: The patient is brought to the operating room placed in supine position on the operating room table. After an adequate level of general anesthesia was obtained, her right breast was prepped with ChloraPrep and draped in sterile fashion. A timeout was taken to ensure the proper patient and proper procedure. We interrogated the breast with the neoprobe. We made a circumareolar incision around the upper side of the nipple after infiltrating with 0.25% Marcaine. Dissection was carried down in the breast tissue with cautery. We used the neoprobe to guide [place] towards the radioactive seed. We excised an area of tissue around the radioactive seed 2 cm in diameter. The specimen was removed and was oriented with a paint kit. Specimen mammogram showed the radioactive seed as well as the biopsy clip within the specimen. This was sent for pathologic examination. There is no residual radioactivity within the biopsy cavity. We inspected carefully for hemostasis. The wound was thoroughly irrigated. We injected Magtrace solution in the periareolar tissue in case invasive ductal carcinoma was found on pathology and  sentinel lymph node biopsy was indicated.  The wound was closed with a deep layer of 3-0 Vicryl and a subcuticular layer of 4-0 Monocryl. Benzoin Steri-Strips were applied. The patient was then extubated and brought to the recovery room in stable condition. All sponge, instrument, and needle counts are correct.  Imogene Burn. Georgette Dover, MD, [facility] Surgery  General/ Trauma Surgery  06/20/2022 8:28 AM

## 2022-06-20 NOTE — Anesthesia Postprocedure Evaluation (Signed)
Anesthesia Post Note  Patient: Madison Bryant  Procedure(s) Performed: RIGHT BREAST LUMPECTOMY WITH RADIOACTIVE SEED LOCALIZATION (Right: Breast)     Patient location during evaluation: PACU Anesthesia Type: General Level of consciousness: awake and alert Pain management: pain level controlled Vital Signs Assessment: post-procedure vital signs reviewed and stable Respiratory status: spontaneous breathing, nonlabored ventilation and respiratory function stable Cardiovascular status: blood pressure returned to baseline and stable Postop Assessment: no apparent nausea or vomiting Anesthetic complications: no  No notable events documented.  Last Vitals:  Vitals:   06/20/22 0903 06/20/22 0941  BP:  109/76  Pulse:  73  Resp:  14  Temp:  36.4 C  SpO2: 91% 95%    Last Pain:  Vitals:   06/20/22 0941  TempSrc:   PainSc: 0-No pain                 Juelz Claar,W. EDMOND

## 2022-06-20 NOTE — Interval H&P Note (Signed)
History and Physical Interval Note:  06/20/2022 7:14 AM  [name]  has presented today for surgery, with the diagnosis of RIGHT BREAST DUCTAL CARCINOMA IN SITU.  The various methods of treatment have been discussed with the patient and family. After consideration of risks, benefits and other options for treatment, the patient has consented to  Procedure(s) with comments: RIGHT BREAST LUMPECTOMY WITH RADIOACTIVE SEED LOCALIZATION (Right) - LMA as a surgical intervention.  The patient's history has been reviewed, patient examined, no change in status, stable for surgery.  I have reviewed the patient's chart and labs.  Questions were answered to the patient's satisfaction.     Madison Bryant

## 2022-06-20 NOTE — Discharge Instructions (Addendum)
[place] Office Phone Number [phone]  BREAST BIOPSY/ PARTIAL MASTECTOMY: POST OP INSTRUCTIONS  Always review your discharge instruction sheet given to you by the facility where your surgery was performed.  IF YOU HAVE DISABILITY OR FAMILY LEAVE FORMS, YOU MUST BRING THEM TO THE OFFICE FOR PROCESSING.  DO NOT GIVE THEM TO YOUR DOCTOR.  A prescription for pain medication may be given to you upon discharge.  Take your pain medication as prescribed, if needed.  If narcotic pain medicine is not needed, then you may take acetaminophen (Tylenol) or ibuprofen (Advil) as needed. Take your usually prescribed medications unless otherwise directed If you need a refill on your pain medication, please contact your pharmacy.  They will contact our office to request authorization.  Prescriptions will not be filled after 5pm or on week-ends. You should eat very light the first 24 hours after surgery, such as soup, crackers, pudding, etc.  Resume your normal diet the day after surgery. Most patients will experience some swelling and bruising in the breast.  Ice packs and a good support bra will help.  Swelling and bruising can take several days to resolve.  It is common to experience some constipation if taking pain medication after surgery.  Increasing fluid intake and taking a stool softener will usually help or prevent this problem from occurring.  A mild laxative (Milk of Magnesia or Miralax) should be taken according to package directions if there are no bowel movements after 48 hours. Unless discharge instructions indicate otherwise, you may remove your bandages 24-48 hours after surgery, and you may shower at that time.  You may have steri-strips (small skin tapes) in place directly over the incision.  These strips should be left on the skin for 7-10 days.  If your surgeon used skin glue on the incision, you may shower in 24 hours.  The glue will flake off over the next 2-3 weeks.  Any  sutures or staples will be removed at the office during your follow-up visit. ACTIVITIES:  You may resume regular daily activities (gradually increasing) beginning the next day.  Wearing a good support bra or sports bra minimizes pain and swelling.  You may have sexual intercourse when it is comfortable. You may drive when you no longer are taking prescription pain medication, you can comfortably wear a seatbelt, and you can safely maneuver your car and apply brakes. RETURN TO WORK:  ______________________________________________________________________________________ Madison Bryant should see your doctor in the office for a follow-up appointment approximately two weeks after your surgery.  Your doctor's nurse will typically make your follow-up appointment when she calls you with your pathology report.  Expect your pathology report 2-3 business days after your surgery.  You may call to check if you do not hear from [place] after three days. OTHER INSTRUCTIONS: _______________________________________________________________________________________________ _____________________________________________________________________________________________________________________________________ _____________________________________________________________________________________________________________________________________ _____________________________________________________________________________________________________________________________________  WHEN TO CALL YOUR DOCTOR: Fever over 101.0 Nausea and/or vomiting. Extreme swelling or bruising. Continued bleeding from incision. Increased pain, redness, or drainage from the incision.  The clinic staff is available to answer your questions during regular business hours.  Please don't hesitate to call and ask to speak to one of the nurses for clinical concerns.  If you have a medical emergency, go to the nearest emergency room or call 911.  A surgeon from [facility] is always on call at the hospital.  For further questions, please visit [email or web address]   Post Anesthesia Home Care Instructions  Activity: Get plenty of rest for the remainder of the  day. A responsible individual must stay with you for 24 hours following the procedure.  For the next 24 hours, DO NOT: -Drive a car -Paediatric nurse -Drink alcoholic beverages -Take any medication unless instructed by your physician -Make any legal decisions or sign important papers.  Meals: Start with liquid foods such as gelatin or soup. Progress to regular foods as tolerated. Avoid greasy, spicy, heavy foods. If nausea and/or vomiting occur, drink only clear liquids until the nausea and/or vomiting subsides. Call your physician if vomiting continues.  Special Instructions/Symptoms: Your throat may feel dry or sore from the anesthesia or the breathing tube placed in your throat during surgery. If this causes discomfort, gargle with warm salt water. The discomfort should disappear within 24 hours.  Tylenol can be taken at 12:30 pm Ibuprofen can be taken at 2:30 pm

## 2022-06-23 ENCOUNTER — Encounter (HOSPITAL_BASED_OUTPATIENT_CLINIC_OR_DEPARTMENT_OTHER): Payer: Self-pay | Admitting: Surgery

## 2022-06-23 LAB — HM MAMMOGRAPHY

## 2022-06-24 ENCOUNTER — Ambulatory Visit: Payer: Self-pay | Admitting: Surgery

## 2022-06-25 ENCOUNTER — Encounter (HOSPITAL_BASED_OUTPATIENT_CLINIC_OR_DEPARTMENT_OTHER): Payer: Self-pay | Admitting: Surgery

## 2022-06-25 LAB — SURGICAL PATHOLOGY

## 2022-06-26 ENCOUNTER — Telehealth: Payer: Self-pay | Admitting: Radiation Oncology

## 2022-06-26 NOTE — Telephone Encounter (Signed)
LVM to r/s CON30/SIM after new re-excision date.

## 2022-06-30 NOTE — Progress Notes (Signed)

## 2022-07-01 ENCOUNTER — Ambulatory Visit (HOSPITAL_BASED_OUTPATIENT_CLINIC_OR_DEPARTMENT_OTHER): Payer: BC Managed Care – PPO | Admitting: Anesthesiology

## 2022-07-01 ENCOUNTER — Encounter (HOSPITAL_BASED_OUTPATIENT_CLINIC_OR_DEPARTMENT_OTHER)
Admission: RE | Disposition: A | Discharge status: Home / Self Care | Payer: Self-pay | Source: Ambulatory Visit | Attending: Surgery

## 2022-07-01 ENCOUNTER — Encounter (HOSPITAL_BASED_OUTPATIENT_CLINIC_OR_DEPARTMENT_OTHER): Payer: Self-pay | Admitting: Surgery

## 2022-07-01 ENCOUNTER — Other Ambulatory Visit: Payer: Self-pay

## 2022-07-01 ENCOUNTER — Ambulatory Visit (HOSPITAL_BASED_OUTPATIENT_CLINIC_OR_DEPARTMENT_OTHER)
Admission: RE | Admit: 2022-07-01 | Discharge: 2022-07-01 | Disposition: A | Discharge status: Home / Self Care | Payer: BC Managed Care – PPO | Source: Ambulatory Visit | Attending: Surgery | Admitting: Surgery

## 2022-07-01 DIAGNOSIS — Z01818 Encounter for other preprocedural examination: Secondary | ICD-10-CM

## 2022-07-01 DIAGNOSIS — Z170 Estrogen receptor positive status [ER+]: Secondary | ICD-10-CM | POA: Insufficient documentation

## 2022-07-01 DIAGNOSIS — C50811 Malignant neoplasm of overlapping sites of right female breast: Secondary | ICD-10-CM | POA: Diagnosis not present

## 2022-07-01 DIAGNOSIS — J45909 Unspecified asthma, uncomplicated: Secondary | ICD-10-CM | POA: Diagnosis not present

## 2022-07-01 DIAGNOSIS — M1990 Unspecified osteoarthritis, unspecified site: Secondary | ICD-10-CM | POA: Diagnosis not present

## 2022-07-01 HISTORY — PX: RE-EXCISION OF BREAST CANCER,SUPERIOR MARGINS: SHX6047

## 2022-07-01 SURGERY — RE-EXCISION OF BREAST CANCER,SUPERIOR MARGINS
Anesthesia: General | Site: Breast | Laterality: Right

## 2022-07-01 MED ORDER — CEFAZOLIN SODIUM-DEXTROSE 2-4 GM/100ML-% IV SOLN
2.0000 g | INTRAVENOUS | Status: AC
  Administered 2022-07-01: 2 g via INTRAVENOUS

## 2022-07-01 MED ORDER — OXYCODONE HCL 5 MG PO TABS: 5.0000 mg | ORAL_TABLET | Freq: Once | ORAL | Status: DC | PRN

## 2022-07-01 MED ORDER — CHLORHEXIDINE GLUCONATE CLOTH 2 % EX PADS: 6.0000 | MEDICATED_PAD | Freq: Once | CUTANEOUS | Status: DC

## 2022-07-01 MED ORDER — LACTATED RINGERS IV SOLN: INTRAVENOUS | Status: DC

## 2022-07-01 MED ORDER — PHENYLEPHRINE 80 MCG/ML (10ML) SYRINGE FOR IV PUSH (FOR BLOOD PRESSURE SUPPORT)
PREFILLED_SYRINGE | INTRAVENOUS | Status: AC
  Filled 2022-07-01: qty 20

## 2022-07-01 MED ORDER — ACETAMINOPHEN 500 MG PO TABS
ORAL_TABLET | ORAL | Status: AC
  Filled 2022-07-01: qty 2

## 2022-07-01 MED ORDER — CEFAZOLIN SODIUM-DEXTROSE 2-4 GM/100ML-% IV SOLN
INTRAVENOUS | Status: AC
  Filled 2022-07-01: qty 100

## 2022-07-01 MED ORDER — PROPOFOL 10 MG/ML IV BOLUS
INTRAVENOUS | Status: DC | PRN
  Administered 2022-07-01: 200 mg via INTRAVENOUS

## 2022-07-01 MED ORDER — FENTANYL CITRATE (PF) 100 MCG/2ML IJ SOLN
INTRAMUSCULAR | Status: AC
  Filled 2022-07-01: qty 2

## 2022-07-01 MED ORDER — MIDAZOLAM HCL 2 MG/2ML IJ SOLN
INTRAMUSCULAR | Status: AC
  Filled 2022-07-01: qty 2

## 2022-07-01 MED ORDER — PROPOFOL 1000 MG/100ML IV EMUL
INTRAVENOUS | Status: AC
  Filled 2022-07-01: qty 200

## 2022-07-01 MED ORDER — ONDANSETRON HCL 4 MG/2ML IJ SOLN
INTRAMUSCULAR | Status: DC | PRN
  Administered 2022-07-01: 4 mg via INTRAVENOUS

## 2022-07-01 MED ORDER — FENTANYL CITRATE (PF) 100 MCG/2ML IJ SOLN
25.0000 ug | INTRAMUSCULAR | Status: DC | PRN
  Administered 2022-07-01: 25 ug via INTRAVENOUS

## 2022-07-01 MED ORDER — 0.9 % SODIUM CHLORIDE (POUR BTL) OPTIME
TOPICAL | Status: DC | PRN
  Administered 2022-07-01: 120 mL

## 2022-07-01 MED ORDER — LIDOCAINE 2% (20 MG/ML) 5 ML SYRINGE
INTRAMUSCULAR | Status: DC | PRN
  Administered 2022-07-01: 30 mg via INTRAVENOUS

## 2022-07-01 MED ORDER — BUPIVACAINE-EPINEPHRINE 0.25% -1:200000 IJ SOLN
INTRAMUSCULAR | Status: DC | PRN
  Administered 2022-07-01: 10 mL

## 2022-07-01 MED ORDER — DEXAMETHASONE SODIUM PHOSPHATE 4 MG/ML IJ SOLN
INTRAMUSCULAR | Status: DC | PRN
  Administered 2022-07-01: 4 mg via INTRAVENOUS

## 2022-07-01 MED ORDER — ACETAMINOPHEN 500 MG PO TABS
1000.0000 mg | ORAL_TABLET | ORAL | Status: AC
  Administered 2022-07-01: 1000 mg via ORAL

## 2022-07-01 MED ORDER — FENTANYL CITRATE (PF) 100 MCG/2ML IJ SOLN
INTRAMUSCULAR | Status: DC | PRN
  Administered 2022-07-01 (×2): 50 ug via INTRAVENOUS

## 2022-07-01 MED ORDER — PROPOFOL 500 MG/50ML IV EMUL
INTRAVENOUS | Status: DC | PRN
  Administered 2022-07-01: 175 ug/kg/min via INTRAVENOUS

## 2022-07-01 MED ORDER — AMISULPRIDE (ANTIEMETIC) 5 MG/2ML IV SOLN: 10.0000 mg | Freq: Once | INTRAVENOUS | Status: DC | PRN

## 2022-07-01 MED ORDER — OXYCODONE HCL 5 MG/5ML PO SOLN: 5.0000 mg | Freq: Once | ORAL | Status: DC | PRN

## 2022-07-01 MED ORDER — MIDAZOLAM HCL 5 MG/5ML IJ SOLN
INTRAMUSCULAR | Status: DC | PRN
  Administered 2022-07-01: 2 mg via INTRAVENOUS

## 2022-07-01 SURGICAL SUPPLY — 48 items
APL PRP STRL LF DISP 70% ISPRP (MISCELLANEOUS) ×1
APL SKNCLS STERI-STRIP NONHPOA (GAUZE/BANDAGES/DRESSINGS) ×1
APPLIER CLIP 9.375 MED OPEN (MISCELLANEOUS) ×1
APR CLP MED 9.3 20 MLT OPN (MISCELLANEOUS) ×1
BENZOIN TINCTURE PRP APPL 2/3 (GAUZE/BANDAGES/DRESSINGS) ×1 IMPLANT
BLADE HEX COATED 2.75 (ELECTRODE) ×1 IMPLANT
BLADE SURG 15 STRL LF DISP TIS (BLADE) ×1 IMPLANT
BLADE SURG 15 STRL SS (BLADE) ×1
CANISTER SUCT 1200ML W/VALVE (MISCELLANEOUS) ×1 IMPLANT
CHLORAPREP W/TINT 26 (MISCELLANEOUS) ×1 IMPLANT
CLIP APPLIE 9.375 MED OPEN (MISCELLANEOUS) IMPLANT
COVER BACK TABLE 60X90IN (DRAPES) ×1 IMPLANT
COVER MAYO STAND STRL (DRAPES) ×1 IMPLANT
DRAPE LAPAROTOMY 100X72 PEDS (DRAPES) ×1 IMPLANT
DRAPE UTILITY XL STRL (DRAPES) ×1 IMPLANT
DRSG TEGADERM 4X4.75 (GAUZE/BANDAGES/DRESSINGS) ×1 IMPLANT
ELECT REM PT RETURN 9FT ADLT (ELECTROSURGICAL) ×1
ELECTRODE REM PT RTRN 9FT ADLT (ELECTROSURGICAL) ×1 IMPLANT
GAUZE SPONGE 4X4 12PLY STRL LF (GAUZE/BANDAGES/DRESSINGS) IMPLANT
GLOVE BIO SURGEON STRL SZ7 (GLOVE) ×1 IMPLANT
GLOVE BIOGEL PI IND STRL 7.5 (GLOVE) ×1 IMPLANT
GOWN STRL REUS W/ TWL LRG LVL3 (GOWN DISPOSABLE) ×2 IMPLANT
GOWN STRL REUS W/ TWL XL LVL3 (GOWN DISPOSABLE) IMPLANT
GOWN STRL REUS W/TWL LRG LVL3 (GOWN DISPOSABLE) ×2
GOWN STRL REUS W/TWL XL LVL3 (GOWN DISPOSABLE) ×1
ILLUMINATOR WAVEGUIDE N/F (MISCELLANEOUS) IMPLANT
KIT MARKER MARGIN INK (KITS) IMPLANT
LIGHT WAVEGUIDE WIDE FLAT (MISCELLANEOUS) IMPLANT
NDL HYPO 25X1 1.5 SAFETY (NEEDLE) ×1 IMPLANT
NEEDLE HYPO 25X1 1.5 SAFETY (NEEDLE) ×1 IMPLANT
NS IRRIG 1000ML POUR BTL (IV SOLUTION) ×1 IMPLANT
PACK BASIN DAY SURGERY FS (CUSTOM PROCEDURE TRAY) ×1 IMPLANT
PENCIL SMOKE EVACUATOR (MISCELLANEOUS) ×1 IMPLANT
SLEEVE SCD COMPRESS KNEE MED (STOCKING) ×1 IMPLANT
SPIKE FLUID TRANSFER (MISCELLANEOUS) IMPLANT
SPONGE T-LAP 4X18 ~~LOC~~+RFID (SPONGE) ×1 IMPLANT
STRIP CLOSURE SKIN 1/2X4 (GAUZE/BANDAGES/DRESSINGS) ×1 IMPLANT
SUT CHROMIC 3 0 SH 27 (SUTURE) IMPLANT
SUT MON AB 4-0 PC3 18 (SUTURE) ×1 IMPLANT
SUT SILK 2 0 SH (SUTURE) IMPLANT
SUT VIC AB 3-0 SH 27 (SUTURE) ×1
SUT VIC AB 3-0 SH 27X BRD (SUTURE) ×1 IMPLANT
SYR BULB EAR ULCER 3OZ GRN STR (SYRINGE) IMPLANT
SYR CONTROL 10ML LL (SYRINGE) ×1 IMPLANT
TOWEL GREEN STERILE FF (TOWEL DISPOSABLE) ×1 IMPLANT
TRAY FAXITRON CT DISP (TRAY / TRAY PROCEDURE) IMPLANT
TUBE CONNECTING 20X1/4 (TUBING) ×1 IMPLANT
YANKAUER SUCT BULB TIP NO VENT (SUCTIONS) ×1 IMPLANT

## 2022-07-01 NOTE — Anesthesia Procedure Notes (Signed)
Procedure Name: LMA Insertion Date/Time: 07/01/2022 10:17 AM  Performed by: Collis Thede, Ernesta Amble, CRNAPre-anesthesia Checklist: Patient identified, Emergency Drugs available, Suction available and Patient being monitored Patient Re-evaluated:Patient Re-evaluated prior to induction Oxygen Delivery Method: Circle system utilized Preoxygenation: Pre-oxygenation with 100% oxygen Induction Type: IV induction Ventilation: Mask ventilation without difficulty LMA: LMA inserted LMA Size: 4.0 Number of attempts: 1 Airway Equipment and Method: Bite block Placement Confirmation: positive ETCO2 Tube secured with: Tape Dental Injury: Teeth and Oropharynx as per pre-operative assessment

## 2022-07-01 NOTE — Anesthesia Preprocedure Evaluation (Signed)
Anesthesia Evaluation  Patient identified by MRN, date of birth, ID band Patient awake    Reviewed: Allergy & Precautions, H&P , NPO status , Patient's Chart, lab work & pertinent test results  History of Anesthesia Complications (+) PONV and history of anesthetic complications  Airway Mallampati: III  TM Distance: >3 FB Neck ROM: Full    Dental no notable dental hx. (+) Teeth Intact, Dental Advisory Given   Pulmonary neg pulmonary ROS, asthma    Pulmonary exam normal breath sounds clear to auscultation       Cardiovascular Exercise Tolerance: Good negative cardio ROS  Rhythm:Regular Rate:Normal     Neuro/Psych   Anxiety     negative neurological ROS  negative psych ROS   GI/Hepatic Neg liver ROS,GERD  ,,  Endo/Other  negative endocrine ROS    Renal/GU negative Renal ROS  negative genitourinary   Musculoskeletal  (+) Arthritis , Osteoarthritis,    Abdominal   Peds  Hematology negative hematology ROS (+)   Anesthesia Other Findings   Reproductive/Obstetrics negative OB ROS                              Anesthesia Physical Anesthesia Plan  ASA: 2  Anesthesia Plan: General   Post-op Pain Management: Tylenol PO (pre-op)* and Toradol IV (intra-op)*   Induction: Intravenous  PONV Risk Score and Plan: 4 or greater and Ondansetron, Dexamethasone, Propofol infusion, TIVA and Midazolam  Airway Management Planned: LMA  Additional Equipment:   Intra-op Plan:   Post-operative Plan: Extubation in OR  Informed Consent: I have reviewed the patients History and Physical, chart, labs and discussed the procedure including the risks, benefits and alternatives for the proposed anesthesia with the patient or authorized representative who has indicated his/her understanding and acceptance.     Dental advisory given  Plan Discussed with: CRNA  Anesthesia Plan Comments:          Anesthesia Quick Evaluation

## 2022-07-01 NOTE — Op Note (Signed)
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  On 06/20/2022 she underwent right breast lumpectomy.  She was found to have a 2 mm focus of invasive ductal carcinoma.  The DCIS showed positive margins at the superior and lateral margins.  She presents now for reexcision of the superior and lateral margins.   Description of procedure: The patient was brought to the operating room and placed in the supine position on the operating room table.  After adequate level of general anesthesia was obtained, the right breast was prepped with ChloraPrep and draped sterile fashion.  A timeout was taken to ensure the proper patient and proper procedure.  We infiltrated the area around the areola with 0.25 percent Marcaine with epinephrine.  Her old incision was opened.  We evacuated the seroma from the biopsy cavity.  Retractors were placed.  I excised a 1 cm thickness of the superior margin which was then oriented with the paint kit.  I then excised an additional 1 cm thickness of the lateral margin which was also oriented with the paint kit.  Hemostasis was obtained with cautery.  We irrigated thoroughly and inspected for hemostasis.  The wound was closed with a deep layer of 3-0 Vicryl and a subcuticular layer 4-0 Monocryl.  Benzoin and Steri-Strips were applied.  The patient was extubated and brought to recovery room in stable condition.  All sponge, instrument, and needle counts are correct.  Imogene Burn. Georgette Dover, MD, [facility]  General Surgery   07/01/2022 10:55 AM

## 2022-07-01 NOTE — Transfer of Care (Signed)
Immediate Anesthesia Transfer of Care Note  Patient: Madison Bryant  Procedure(s) Performed: RE-EXCISION OF SUPERIOR AND LATERAL MARGINS RIGHT BREAST (Right: Breast)  Patient Location: PACU  Anesthesia Type:General  Level of Consciousness: awake, alert , oriented, and drowsy  Airway & Oxygen Therapy: Patient Spontanous Breathing and Patient connected to face mask oxygen  Post-op Assessment: Report given to RN and Post -op Vital signs reviewed and stable  Post vital signs: Reviewed and stable  Last Vitals:  Vitals Value Taken Time  BP    Temp    Pulse 76 07/01/22 1059  Resp    SpO2 100 % 07/01/22 1059  Vitals shown include unvalidated device data.  Last Pain:  Vitals:   07/01/22 0849  TempSrc: Oral  PainSc: 0-No pain      Patients Stated Pain Goal: 4 ([id number])  Complications: No notable events documented.

## 2022-07-01 NOTE — Anesthesia Postprocedure Evaluation (Signed)
Anesthesia Post Note  Patient: Madison Bryant  Procedure(s) Performed: RE-EXCISION OF SUPERIOR AND LATERAL MARGINS RIGHT BREAST (Right: Breast)     Patient location during evaluation: Phase II Anesthesia Type: General Level of consciousness: awake Pain management: pain level controlled Vital Signs Assessment: post-procedure vital signs reviewed and stable Respiratory status: spontaneous breathing Cardiovascular status: stable Postop Assessment: no apparent nausea or vomiting Anesthetic complications: no  No notable events documented.  Last Vitals:  Vitals:   07/01/22 1125 07/01/22 1130  BP:  122/88  Pulse: 63 (!) 58  Resp: 14 13  Temp:    SpO2: 98% 97%    Last Pain:  Vitals:   07/01/22 1125  TempSrc:   PainSc: Lydia Jr

## 2022-07-01 NOTE — Anesthesia Procedure Notes (Signed)
Procedure Name: LMA Insertion Date/Time: 07/01/2022 10:17 AM  Performed by: Nakai Pollio, Ernesta Amble, CRNAPre-anesthesia Checklist: Patient identified, Emergency Drugs available, Suction available and Patient being monitored Patient Re-evaluated:Patient Re-evaluated prior to induction Oxygen Delivery Method: Circle system utilized Preoxygenation: Pre-oxygenation with 100% oxygen Induction Type: IV induction Ventilation: Mask ventilation without difficulty LMA: LMA inserted LMA Size: 4.0 Number of attempts: 1 Airway Equipment and Method: Bite block Placement Confirmation: positive ETCO2 Tube secured with: Tape Dental Injury: Teeth and Oropharynx as per pre-operative assessment

## 2022-07-01 NOTE — H&P (Signed)
Subjective    Chief Complaint: Breast cancer   History of Present Illness: Madison Bryant is a 67 y.o. female who is seen today as an office consultation at the request of Dr. Chryl Heck for evaluation of Breast Cancer .   Screening mammogram on 05/15/2022 revealed some focal asymmetry in the right breast.  Diagnostic mammogram revealed a 1.5 cm area of focal asymmetry in the right breast at 12:00 3 cm from the nipple.  Ultrasound revealed a 1.3 x 1.6 x 1.5 cm irregular mass with a lobulated margin in the right breast 12:00 located 2 cm from the nipple.  The axilla was negative.  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   Lumpectomy on 06/20/22 revealed a 2 mm focus of invasive ductal carcinoma and DCIS positive superior/ lateral margins.  She presents now for reexcision of the superior and lateral margins.     Review of Systems: A complete review of systems was obtained from the patient.  I have reviewed this information and discussed as appropriate with the patient.  See HPI as well for other ROS.   Review of Systems  Constitutional: Negative.   HENT: Negative.    Eyes: Negative.   Respiratory: Negative.    Cardiovascular: Negative.   Gastrointestinal: Negative.   Genitourinary: Negative.   Musculoskeletal: Negative.   Skin: Negative.   Neurological: Negative.   Endo/Heme/Allergies: Negative.   Psychiatric/Behavioral: Negative.          Medical History:     Past Medical History:  Diagnosis Date   Allergic state     Anticardiolipin antibody positive      low titer IGM   Asthma, unspecified asthma severity, unspecified whether complicated, unspecified whether persistent     Eczema, unspecified     GERD (gastroesophageal reflux disease)     Osteoarthritis           Patient Active Problem List  Diagnosis   Allergic state   Eczema, unspecified   Anticardiolipin antibody positive   Osteoarthritis   Intraductal carcinoma in situ of right breast   Depressive disorder    Anxiety   GERD (gastroesophageal reflux disease)   Malignant neoplasm of overlapping sites of right female breast (CMS-HCC)   Seasonal allergic rhinitis           Past Surgical History:  Procedure Laterality Date   .ORIF fibula fracture Left 08/21/2016   .I&D L extremity Left 10/09/2017   ARTHROSCOPIC ROTATOR CUFF REPAIR       COLONOSCOPY N/A     de Quervain's release Left     HERNIA REPAIR       HYSTERECTOMY               Allergies  Allergen Reactions   Erythromycin Other (See Comments) and Nausea And Vomiting      GI upset   GI UPSET   Erythromycin Base Nausea            Current Outpatient Medications on File Prior to Visit  Medication Sig Dispense Refill   acetaminophen (TYLENOL) 325 MG tablet Take 325 mg by mouth as needed for Pain       amoxicillin-clavulanate (AUGMENTIN) 875-125 mg tablet Take 1 tablet by mouth every 12 (twelve) hours       azelastine (ASTELIN) 137 mcg nasal spray Place 1 spray into both nostrils 2 (two) times daily       albuterol (VENTOLIN HFA) 90 mcg/actuation inhaler Inhale 2 inhalations into the lungs every 6 (six) hours as  needed for Wheezing.       aspirin 81 MG EC tablet Take 81 mg by mouth once daily.       CARTILAGE/COLLAGEN II/HYALURON (MOVE FREE ULTRA ORAL) Take 1 tablet by mouth once daily.       estradiol (ESTRACE) 2 MG tablet Take 2 mg by mouth once daily.       ibuprofen (ADVIL,MOTRIN) 200 MG tablet Take 200 mg by mouth every 6 (six) hours as needed for Pain. For joint pain       loratadine (CLARITIN) 10 mg capsule Take 10 mg by mouth once daily.       meloxicam (MOBIC) 7.5 MG tablet Take 7.5 mg by mouth once daily.       mometasone (ASMANEX TWISTHALER) 220 mcg (14 doses) Inhale 220 mcg into the lungs once daily.        No current facility-administered medications on file prior to visit.           Family History  Problem Relation Age of Onset   Stroke Sister     Clotting disorder Sister        Social History       Tobacco  Use  Smoking Status Never  Smokeless Tobacco Never      Social History         Socioeconomic History   Marital status: Unknown  Tobacco Use   Smoking status: Never   Smokeless tobacco: Never  Vaping Use   Vaping Use: Never used  Substance and Sexual Activity   Alcohol use: No      Alcohol/week: 0.0 standard drinks of alcohol   Drug use: No      Objective:          Physical Exam    Constitutional:  WDWN in NAD, conversant, no obvious deformities; lying in bed comfortably Eyes:  Pupils equal, round; sclera anicteric; moist conjunctiva; no lid lag HENT:  Oral mucosa moist; good dentition  Neck:  No masses palpated, trachea midline; no thyromegaly Lungs:  CTA bilaterally; normal respiratory effort Breasts:  symmetric, healing right breast incision CV:  Regular rate and rhythm; no murmurs; extremities well-perfused with no edema Abd:  +bowel sounds, soft, non-tender, no palpable organomegaly; no palpable hernias Musc:  Unable to assess gait; no apparent clubbing or cyanosis in extremities Lymphatic:  No palpable cervical or axillary lymphadenopathy Skin:  Warm, dry; no sign of jaundice Psychiatric - alert and oriented x 4; calm mood and affect     Labs, Imaging and Diagnostic Testing: Diagnosis Breast, right, needle core biopsy, 12 o'clock, 2cmfn - DUCTAL CARCINOMA IN SITU, INTERMEDIATE GRADE (2), CRIBRIFORM TYPE - CANNOT RULE OUT FOCAL MICROINVASION - NECROSIS: PRESENT - CALCIFICATIONS: PRESENT - DCIS LENGTH: 1.1 CM - SEE NOTE Diagnosis Note Immunohistochemical staining for myoepithelial markers (calponin and smooth muscle myosin) is performed on block 1A. The myoepithelial markers are very focally lost however the morphologic appearance of the corresponding area on H&E stain does not appear to be invasive. Dr. Saralyn Pilar reviewed the case and agrees with the above diagnosis. A breast prognostic profile (ER and PR) is pending and will be reported in an addendum. These  results were called to [facility] on 05/27/2022. Maretta Bees M.D. Pathologist, Electronic Signature (Case signed 05/27/2022)   PROGNOSTIC INDICATORS Results: IMMUNOHISTOCHEMICAL AND MORPHOMETRIC ANALYSIS PERFORMED MANUALLY Estrogen Receptor: 100%, POSITIVE, STRONG STAINING INTENSITY Progesterone Receptor: 100%, POSITIVE, STRONG STAINING INTENSITY REFERENCE RANGE ESTROGEN RECEPTOR NEGATIVE 0% POSITIVE =>1% REFERENCE RANGE PROGESTERONE RECEPTOR NEGATIVE 0%  POSITIVE =>1% All controls stained appropriately Claudette Laws MD Pathologist, Electronic Signature ( Signed 05/28/2022)   Assessment and Plan:  Diagnoses and all orders for this visit:   Intraductal carcinoma in situ of right breast   Invasive ductal carcinoma right breast   Reexcision of superior and lateral margins right breast lumpectomy.  The surgical procedure has been discussed with the patient.  Potential risks, benefits, alternative treatments, and expected outcomes have been explained.  All of the patient's questions at this time have been answered.  The likelihood of reaching the patient's treatment goal is good.  The patient understand the proposed surgical procedure and wishes to proceed.  Imogene Burn. Georgette Dover, MD, [facility]  General Surgery   07/01/2022 8:57 AM

## 2022-07-01 NOTE — Discharge Instructions (Addendum)
NO TYLENOL TILL 3pm    [place] Office Phone Number [phone]  BREAST BIOPSY/ PARTIAL MASTECTOMY: POST OP INSTRUCTIONS  Always review your discharge instruction sheet given to you by the facility where your surgery was performed.  IF YOU HAVE DISABILITY OR FAMILY LEAVE FORMS, YOU MUST BRING THEM TO THE OFFICE FOR PROCESSING.  DO NOT GIVE THEM TO YOUR DOCTOR.  A prescription for pain medication may be given to you upon discharge.  Take your pain medication as prescribed, if needed.  If narcotic pain medicine is not needed, then you may take acetaminophen (Tylenol) or ibuprofen (Advil) as needed. Take your usually prescribed medications unless otherwise directed If you need a refill on your pain medication, please contact your pharmacy.  They will contact our office to request authorization.  Prescriptions will not be filled after 5pm or on week-ends. You should eat very light the first 24 hours after surgery, such as soup, crackers, pudding, etc.  Resume your normal diet the day after surgery. Most patients will experience some swelling and bruising in the breast.  Ice packs and a good support bra will help.  Swelling and bruising can take several days to resolve.  It is common to experience some constipation if taking pain medication after surgery.  Increasing fluid intake and taking a stool softener will usually help or prevent this problem from occurring.  A mild laxative (Milk of Magnesia or Miralax) should be taken according to package directions if there are no bowel movements after 48 hours. Unless discharge instructions indicate otherwise, you may remove your bandages 24-48 hours after surgery, and you may shower at that time.  You may have steri-strips (small skin tapes) in place directly over the incision.  These strips should be left on the skin for 7-10 days.  If your surgeon used skin glue on the incision, you may shower in 24 hours.  The glue will flake off over  the next 2-3 weeks.  Any sutures or staples will be removed at the office during your follow-up visit. ACTIVITIES:  You may resume regular daily activities (gradually increasing) beginning the next day.  Wearing a good support bra or sports bra minimizes pain and swelling.  You may have sexual intercourse when it is comfortable. You may drive when you no longer are taking prescription pain medication, you can comfortably wear a seatbelt, and you can safely maneuver your car and apply brakes. RETURN TO WORK:  ______________________________________________________________________________________ Madison Bryant should see your doctor in the office for a follow-up appointment approximately two weeks after your surgery.  Your doctor's nurse will typically make your follow-up appointment when she calls you with your pathology report.  Expect your pathology report 2-3 business days after your surgery.  You may call to check if you do not hear from [place] after three days. OTHER INSTRUCTIONS: _______________________________________________________________________________________________ _____________________________________________________________________________________________________________________________________ _____________________________________________________________________________________________________________________________________ _____________________________________________________________________________________________________________________________________  WHEN TO CALL YOUR DOCTOR: Fever over 101.0 Nausea and/or vomiting. Extreme swelling or bruising. Continued bleeding from incision. Increased pain, red Post Anesthesia Home Care Instructions  Activity: Get plenty of rest for the remainder of the day. A responsible individual must stay with you for 24 hours following the procedure.  For the next 24 hours, DO NOT: -Drive a car -Paediatric nurse -Drink alcoholic beverages -Take any  medication unless instructed by your physician -Make any legal decisions or sign important papers.  Meals: Start with liquid foods such as gelatin or soup. Progress to regular foods as tolerated. Avoid greasy, spicy, heavy foods. If  nausea and/or vomiting occur, drink only clear liquids until the nausea and/or vomiting subsides. Call your physician if vomiting continues.  Special Instructions/Symptoms: Your throat may feel dry or sore from the anesthesia or the breathing tube placed in your throat during surgery. If this causes discomfort, gargle with warm salt water. The discomfort should disappear within 24 hours.  If you had a scopolamine patch placed behind your ear for the management of post- operative nausea and/or vomiting:  1. The medication in the patch is effective for 72 hours, after which it should be removed.  Wrap patch in a tissue and discard in the trash. Wash hands thoroughly with soap and water. 2. You may remove the patch earlier than 72 hours if you experience unpleasant side effects which may include dry mouth, dizziness or visual disturbances. 3. Avoid touching the patch. Wash your hands with soap and water after contact with the patch.    Post Anesthesia Home Care Instructions  Activity: Get plenty of rest for the remainder of the day. A responsible individual must stay with you for 24 hours following the procedure.  For the next 24 hours, DO NOT: -Drive a car -Paediatric nurse -Drink alcoholic beverages -Take any medication unless instructed by your physician -Make any legal decisions or sign important papers.  Meals: Start with liquid foods such as gelatin or soup. Progress to regular foods as tolerated. Avoid greasy, spicy, heavy foods. If nausea and/or vomiting occur, drink only clear liquids until the nausea and/or vomiting subsides. Call your physician if vomiting continues.  Special Instructions/Symptoms: Your throat may feel dry or sore from the  anesthesia or the breathing tube placed in your throat during surgery. If this causes discomfort, gargle with warm salt water. The discomfort should disappear within 24 hours.  If you had a scopolamine patch placed behind your ear for the management of post- operative nausea and/or vomiting:  1. The medication in the patch is effective for 72 hours, after which it should be removed.  Wrap patch in a tissue and discard in the trash. Wash hands thoroughly with soap and water. 2. You may remove the patch earlier than 72 hours if you experience unpleasant side effects which may include dry mouth, dizziness or visual disturbances. 3. Avoid touching the patch. Wash your hands with soap and water after contact with the patch.   ness, or drainage from the incision.  The clinic staff is available to answer your questions during regular business hours.  Please don't hesitate to call and ask to speak to one of the nurses for clinical concerns.  If you have a medical emergency, go to the nearest emergency room or call 911.  A surgeon from [facility] is always on call at the hospital.  For further questions, please visit [email or web address]

## 2022-07-02 ENCOUNTER — Encounter (HOSPITAL_BASED_OUTPATIENT_CLINIC_OR_DEPARTMENT_OTHER): Payer: Self-pay | Admitting: Surgery

## 2022-07-02 LAB — SURGICAL PATHOLOGY

## 2022-07-07 ENCOUNTER — Encounter: Payer: Self-pay | Admitting: *Deleted

## 2022-07-08 ENCOUNTER — Encounter: Payer: Self-pay | Admitting: Family Medicine

## 2022-07-15 ENCOUNTER — Encounter: Payer: Self-pay | Admitting: Family Medicine

## 2022-07-15 NOTE — Progress Notes (Signed)
Nursing interview for Ductal carcinoma in situ (DCIS) of right breast.   Patient identity verified. Patient doing well. No RT breast discomfort conveyed at this time. Incision line healing well.  Meaningful use complete. Hysterectomy  BP (!) 140/89 (BP Location: Left Arm, Patient Position: Sitting, Cuff Size: Normal)   Pulse (!) 107   Temp (!) 97.2 F (36.2 C) (Temporal)   Resp 18   Ht 5\' 6"  (1.676 m)   Wt 183 lb (83 kg)   SpO2 97%   BMI 29.54 kg/m   This concludes the interview.   Leandra Kern, LPN

## 2022-07-17 ENCOUNTER — Ambulatory Visit: Payer: BC Managed Care – PPO | Admitting: Radiation Oncology

## 2022-07-17 ENCOUNTER — Ambulatory Visit: Payer: BC Managed Care – PPO

## 2022-07-18 ENCOUNTER — Ambulatory Visit: Payer: BC Managed Care – PPO | Admitting: Radiation Oncology

## 2022-07-21 ENCOUNTER — Encounter: Payer: Self-pay | Admitting: Family Medicine

## 2022-07-21 DIAGNOSIS — C50911 Malignant neoplasm of unspecified site of right female breast: Secondary | ICD-10-CM | POA: Insufficient documentation

## 2022-07-25 ENCOUNTER — Encounter: Payer: Self-pay | Admitting: Family Medicine

## 2022-07-28 NOTE — Progress Notes (Signed)
Radiation Oncology         ([phone]) [phone] ________________________________  Name: Madison Bryant        MRN: [id number]  Date of Service: 07/29/2022 DOB: Jul 24, 1955  OF:[id number], Cammie Mcgee, MD  Benay Pike, MD     REFERRING PHYSICIAN: Benay Pike, MD   DIAGNOSIS: The encounter diagnosis was Ductal carcinoma in situ (DCIS) of right breast.   HISTORY OF PRESENT ILLNESS: Madison Bryant is a 67 y.o. female seen in the multidisciplinary breast clinic for a new diagnosis of right breast cancer.  The patient had a screening mammogram that identified a right breast asymmetry.  She returned for further diagnostic workup.  By ultrasound the lesion measured up to 1.6 cm in the right breast at 12:00 correlating to mammography findings.  Her axilla was negative for adenopathy.  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  The tumor cells were ER/PR positive.   Since her last visit, the patient underwent a right lumpectomy on 06/20/2022 which showed an incidental 2 mm area of grade 1 invasive ductal carcinoma in addition to the known DCIS.  Her inferior margin was positive for DCIS and her invasive cancer was ER/PR positive, HER2 negative with a Ki-67 of 5%.  She had reexcision of her margins on 07/01/2022 that showed low-grade DCIS with the new superior margin less than 1 mm from DCIS, the lateral margin excision was also negative for disease.  She is seen to discuss adjuvant radiotherapy.    PREVIOUS RADIATION THERAPY: No   PAST MEDICAL HISTORY:  Past Medical History:  Diagnosis Date   Allergy    seasonal, pollen, dust, cedar   Ankle fracture    left   Arthritis    fingers, knees and hips   Asthma    Breast cancer ([place])    DCIS (1/24)   Cellulitis    left foot   Family history of adverse reaction to anesthesia    sister cant use novacaine   GERD (gastroesophageal reflux disease)    OTC med prn   PONV (postoperative nausea and vomiting)     Wears glasses        PAST SURGICAL HISTORY: Past Surgical History:  Procedure Laterality Date   ABDOMINAL HYSTERECTOMY     BREAST LUMPECTOMY WITH RADIOACTIVE SEED LOCALIZATION Right 06/20/2022   Procedure: RIGHT BREAST LUMPECTOMY WITH RADIOACTIVE SEED LOCALIZATION;  Surgeon: Donnie Mesa, MD;  Location: [place];  Service: General;  Laterality: Right;  LMA   COLONOSCOPY  2006   I & D EXTREMITY Left 10/09/2017   Procedure: IRRIGATION AND DEBRIDEMENT LEFT FOOT;  Surgeon: Leandrew Koyanagi, MD;  Location: [place];  Service: Orthopedics;  Laterality: Left;   ORIF FIBULA FRACTURE Left 08/21/2016   Procedure: OPEN REDUCTION INTERNAL FIXATION (ORIF) DISTAL FIBULA FRACTURE;  Surgeon: Ninetta Lights, MD;  Location: [place];  Service: Orthopedics;  Laterality: Left;   RE-EXCISION OF BREAST CANCER,SUPERIOR MARGINS Right 07/01/2022   Procedure: RE-EXCISION OF SUPERIOR AND LATERAL MARGINS RIGHT BREAST;  Surgeon: Donnie Mesa, MD;  Location: [place];  Service: General;  Laterality: Right;   ROTATOR CUFF REPAIR Right    TONSILLECTOMY     TUBAL LIGATION       FAMILY HISTORY:  Family History  Problem Relation Age of Onset   71 / Stillbirths Mother    Colon cancer Paternal Aunt 75 - 57     SOCIAL HISTORY:  reports that she has  never smoked. She has never used smokeless tobacco. She reports that she does not drink alcohol and does not use drugs.  The patient is married and lives in [place],  [place]. 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 She has young grandchildren she sees often, and she and her husband enjoy trips to [facility] in their 5th wheel camper.    ALLERGIES: Erythromycin   MEDICATIONS:  Current Outpatient Medications  Medication Sig Dispense Refill   acetaminophen (TYLENOL) 325 MG tablet Take 650 mg by mouth every 6 (six) hours as needed.     Azelastine HCl 137 MCG/SPRAY SOLN  Place into both nostrils.     Cetirizine HCl (ZYRTEC PO) Take by mouth.     Cyanocobalamin (VITAMIN B 12 PO) Take by mouth.     ketoconazole (NIZORAL) 2 % shampoo SMARTSIG:sparingly Topical Twice a Week     Misc Natural Products (NEURIVA PO) Take by mouth.     Multiple Vitamins-Minerals (MULTIVITAMIN ADULTS 50+) TABS Take by mouth.     vitamin C (ASCORBIC ACID) 500 MG tablet Take 500 mg by mouth daily.     VITAMIN D, CHOLECALCIFEROL, PO Take by mouth.     XIIDRA 5 % SOLN      No current facility-administered medications for this encounter.     REVIEW OF SYSTEMS: On review of systems, the patient reports that she is doing great. She reports she was more tired from recovering from surgery than she expected, but has gotten back into physical activity. She has right rotator cuff injury and wanted to make [place] aware of this as we plan positioning for her treatment. No other complaints are verbalized.      PHYSICAL EXAM:  Wt Readings from Last 3 Encounters:  07/29/22 183 lb (83 kg)  07/01/22 179 lb 7.3 oz (81.4 kg)  06/20/22 177 lb 14.6 oz (80.7 kg)   Temp Readings from Last 3 Encounters:  07/29/22 (!) 97.2 F (36.2 C) (Temporal)  07/01/22 (!) 97.4 F (36.3 C) (Oral)  06/20/22 97.6 F (36.4 C)   BP Readings from Last 3 Encounters:  07/29/22 (!) 140/89  07/01/22 (!) 118/94  06/20/22 109/76   Pulse Readings from Last 3 Encounters:  07/29/22 (!) 107  07/01/22 84  06/20/22 73    In general this is a well appearing caucasian female in no acute distress. She's alert and oriented x4 and appropriate throughout the examination. Cardiopulmonary assessment is negative for acute distress and she exhibits normal effort. The right breast incision site is well healed without erythema, separation, drainage, or fluctuance noted. Mild discoloration from prior Magtrace injection at the time of her first surgery is noted in the areolar region.     ECOG = 0  0 - Asymptomatic (Fully active, able  to carry on all predisease activities without restriction)  1 - Symptomatic but completely ambulatory (Restricted in physically strenuous activity but ambulatory and able to carry out work of a light or sedentary nature. For example, light housework, office work)  2 - Symptomatic, <50% in bed during the day (Ambulatory and capable of all self care but unable to carry out any work activities. Up and about more than 50% of waking hours)  3 - Symptomatic, >50% in bed, but not bedbound (Capable of only limited self-care, confined to bed or chair 50% or more of waking hours)  4 - Bedbound (Completely disabled. Cannot carry on any self-care. Totally confined to bed or chair)  5 -  Death   [place] Pen MM, Creech RH, Tormey DC, et al. [phone]). "Toxicity and response criteria of the [facility]". [place] Oncol. 5 (6): 649-55    LABORATORY DATA:  Lab Results  Component Value Date   WBC 3.9 (L) 06/04/2022   HGB 12.7 06/04/2022   HCT 36.9 06/04/2022   MCV 87.4 06/04/2022   PLT 243 06/04/2022   Lab Results  Component Value Date   NA 142 06/04/2022   K 3.9 06/04/2022   CL 107 06/04/2022   CO2 30 06/04/2022   Lab Results  Component Value Date   ALT 17 06/04/2022   AST 16 06/04/2022   ALKPHOS 81 06/04/2022   BILITOT 0.6 06/04/2022      RADIOGRAPHY: No results found.     IMPRESSION/PLAN: 1. 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 Dr. Lisbeth Renshaw has reviewed her pathology findings and today she and I reviewed the nature of  right breast disease. She has done well since surgery. Dr. Lisbeth Renshaw recommends external radiotherapy to the breast to reduce risks of local recurrence followed by antiestrogen therapy. We discussed the risks, benefits, short, and long term effects of radiotherapy, as well as the curative intent, and the patient is interested in proceeding. I discussed the delivery and logistics of  radiotherapy and Dr. Lisbeth Renshaw recommends course of 4 weeks of radiotherapy to the right breast. Written consent is obtained and placed in the chart, a copy was provided to the patient. She will simulate today.   In a visit lasting 45 minutes, greater than 50% of the time was spent face to face reviewing her case, as well as in preparation of, discussing, and coordinating the patient's care.     Carola Rhine, [facility]    **Disclaimer: This note was dictated with voice recognition software. Similar sounding words can inadvertently be transcribed and this note may contain transcription errors which may not have been corrected upon publication of note.**

## 2022-07-29 ENCOUNTER — Encounter: Payer: Self-pay | Admitting: Radiation Oncology

## 2022-07-29 ENCOUNTER — Ambulatory Visit
Admission: RE | Admit: 2022-07-29 | Discharge: 2022-07-29 | Disposition: A | Discharge status: Home / Self Care | Payer: BC Managed Care – PPO | Source: Ambulatory Visit | Attending: Radiation Oncology | Admitting: Radiation Oncology

## 2022-07-29 ENCOUNTER — Other Ambulatory Visit: Payer: Self-pay

## 2022-07-29 ENCOUNTER — Institutional Professional Consult (permissible substitution): Payer: BC Managed Care – PPO | Admitting: Radiation Oncology

## 2022-07-29 VITALS — BP 140/89 | HR 107 | Temp 97.2°F | Resp 18 | Ht 66.0 in | Wt 183.0 lb

## 2022-07-29 DIAGNOSIS — D0511 Intraductal carcinoma in situ of right breast: Secondary | ICD-10-CM

## 2022-07-29 DIAGNOSIS — Z170 Estrogen receptor positive status [ER+]: Secondary | ICD-10-CM | POA: Diagnosis not present

## 2022-07-30 ENCOUNTER — Encounter: Payer: Self-pay | Admitting: Family Medicine

## 2022-08-04 ENCOUNTER — Encounter: Payer: Self-pay | Admitting: *Deleted

## 2022-08-04 DIAGNOSIS — D0511 Intraductal carcinoma in situ of right breast: Secondary | ICD-10-CM | POA: Diagnosis not present

## 2022-08-08 ENCOUNTER — Encounter: Payer: Self-pay | Admitting: *Deleted

## 2022-08-08 DIAGNOSIS — D0511 Intraductal carcinoma in situ of right breast: Secondary | ICD-10-CM

## 2022-08-12 ENCOUNTER — Ambulatory Visit
Admission: RE | Admit: 2022-08-12 | Discharge: 2022-08-12 | Disposition: A | Discharge status: Home / Self Care | Payer: BC Managed Care – PPO | Source: Ambulatory Visit | Attending: Radiation Oncology | Admitting: Radiation Oncology

## 2022-08-12 ENCOUNTER — Other Ambulatory Visit: Payer: Self-pay

## 2022-08-12 DIAGNOSIS — D0511 Intraductal carcinoma in situ of right breast: Secondary | ICD-10-CM | POA: Diagnosis not present

## 2022-08-12 LAB — RAD ONC ARIA SESSION SUMMARY
Course Elapsed Days: 0
Plan Fractions Treated to Date: 1
Plan Prescribed Dose Per Fraction: 2.66 Gy
Plan Total Fractions Prescribed: 16
Plan Total Prescribed Dose: 42.56 Gy
Reference Point Dosage Given to Date: 2.66 Gy
Reference Point Session Dosage Given: 2.66 Gy
Session Number: 1

## 2022-08-12 MED ORDER — ALRA NON-METALLIC DEODORANT (RAD-ONC)
1.0000 | Freq: Once | TOPICAL | Status: AC
  Administered 2022-08-12: 1 via TOPICAL

## 2022-08-12 MED ORDER — RADIAPLEXRX EX GEL: Freq: Once | CUTANEOUS | Status: AC

## 2022-08-13 ENCOUNTER — Other Ambulatory Visit: Payer: Self-pay

## 2022-08-13 ENCOUNTER — Ambulatory Visit
Admission: RE | Admit: 2022-08-13 | Discharge: 2022-08-13 | Disposition: A | Discharge status: Home / Self Care | Payer: BC Managed Care – PPO | Source: Ambulatory Visit | Attending: Radiation Oncology | Admitting: Radiation Oncology

## 2022-08-13 DIAGNOSIS — D0511 Intraductal carcinoma in situ of right breast: Secondary | ICD-10-CM | POA: Diagnosis not present

## 2022-08-13 LAB — RAD ONC ARIA SESSION SUMMARY
Course Elapsed Days: 1
Plan Fractions Treated to Date: 2
Plan Prescribed Dose Per Fraction: 2.66 Gy
Plan Total Fractions Prescribed: 16
Plan Total Prescribed Dose: 42.56 Gy
Reference Point Dosage Given to Date: 5.32 Gy
Reference Point Session Dosage Given: 2.66 Gy
Session Number: 2

## 2022-08-14 ENCOUNTER — Other Ambulatory Visit: Payer: Self-pay

## 2022-08-14 ENCOUNTER — Ambulatory Visit
Admission: RE | Admit: 2022-08-14 | Discharge: 2022-08-14 | Disposition: A | Discharge status: Home / Self Care | Payer: BC Managed Care – PPO | Source: Ambulatory Visit | Attending: Radiation Oncology | Admitting: Radiation Oncology

## 2022-08-14 DIAGNOSIS — D0511 Intraductal carcinoma in situ of right breast: Secondary | ICD-10-CM | POA: Diagnosis not present

## 2022-08-14 LAB — RAD ONC ARIA SESSION SUMMARY
Course Elapsed Days: 2
Plan Fractions Treated to Date: 3
Plan Prescribed Dose Per Fraction: 2.66 Gy
Plan Total Fractions Prescribed: 16
Plan Total Prescribed Dose: 42.56 Gy
Reference Point Dosage Given to Date: 7.98 Gy
Reference Point Session Dosage Given: 2.66 Gy
Session Number: 3

## 2022-08-15 ENCOUNTER — Other Ambulatory Visit: Payer: Self-pay

## 2022-08-15 ENCOUNTER — Ambulatory Visit
Admission: RE | Admit: 2022-08-15 | Discharge: 2022-08-15 | Disposition: A | Discharge status: Home / Self Care | Payer: BC Managed Care – PPO | Source: Ambulatory Visit | Attending: Radiation Oncology | Admitting: Radiation Oncology

## 2022-08-15 ENCOUNTER — Telehealth: Payer: Self-pay | Admitting: Hematology and Oncology

## 2022-08-15 ENCOUNTER — Encounter: Payer: Self-pay | Admitting: *Deleted

## 2022-08-15 DIAGNOSIS — D0511 Intraductal carcinoma in situ of right breast: Secondary | ICD-10-CM | POA: Diagnosis not present

## 2022-08-15 LAB — RAD ONC ARIA SESSION SUMMARY
Course Elapsed Days: 3
Plan Fractions Treated to Date: 4
Plan Prescribed Dose Per Fraction: 2.66 Gy
Plan Total Fractions Prescribed: 16
Plan Total Prescribed Dose: 42.56 Gy
Reference Point Dosage Given to Date: 10.64 Gy
Reference Point Session Dosage Given: 2.66 Gy
Session Number: 4

## 2022-08-15 NOTE — Telephone Encounter (Signed)
Reached out to patient per IB message, patient aware of date and time of appointment.

## 2022-08-18 ENCOUNTER — Other Ambulatory Visit: Payer: Self-pay

## 2022-08-18 ENCOUNTER — Ambulatory Visit
Admission: RE | Admit: 2022-08-18 | Discharge: 2022-08-18 | Disposition: A | Discharge status: Home / Self Care | Payer: BC Managed Care – PPO | Source: Ambulatory Visit | Attending: Radiation Oncology | Admitting: Radiation Oncology

## 2022-08-18 DIAGNOSIS — D0511 Intraductal carcinoma in situ of right breast: Secondary | ICD-10-CM | POA: Diagnosis not present

## 2022-08-18 LAB — RAD ONC ARIA SESSION SUMMARY
Course Elapsed Days: 6
Plan Fractions Treated to Date: 5
Plan Prescribed Dose Per Fraction: 2.66 Gy
Plan Total Fractions Prescribed: 16
Plan Total Prescribed Dose: 42.56 Gy
Reference Point Dosage Given to Date: 13.3 Gy
Reference Point Session Dosage Given: 2.66 Gy
Session Number: 5

## 2022-08-19 ENCOUNTER — Ambulatory Visit
Admission: RE | Admit: 2022-08-19 | Discharge: 2022-08-19 | Disposition: A | Discharge status: Home / Self Care | Payer: BC Managed Care – PPO | Source: Ambulatory Visit | Attending: Radiation Oncology | Admitting: Radiation Oncology

## 2022-08-19 ENCOUNTER — Other Ambulatory Visit: Payer: Self-pay

## 2022-08-19 DIAGNOSIS — D0511 Intraductal carcinoma in situ of right breast: Secondary | ICD-10-CM | POA: Diagnosis not present

## 2022-08-19 LAB — RAD ONC ARIA SESSION SUMMARY
Course Elapsed Days: 7
Plan Fractions Treated to Date: 6
Plan Prescribed Dose Per Fraction: 2.66 Gy
Plan Total Fractions Prescribed: 16
Plan Total Prescribed Dose: 42.56 Gy
Reference Point Dosage Given to Date: 15.96 Gy
Reference Point Session Dosage Given: 2.66 Gy
Session Number: 6

## 2022-08-20 ENCOUNTER — Ambulatory Visit
Admission: RE | Admit: 2022-08-20 | Discharge: 2022-08-20 | Disposition: A | Discharge status: Home / Self Care | Payer: BC Managed Care – PPO | Source: Ambulatory Visit | Attending: Radiation Oncology | Admitting: Radiation Oncology

## 2022-08-20 ENCOUNTER — Other Ambulatory Visit: Payer: Self-pay

## 2022-08-20 DIAGNOSIS — D0511 Intraductal carcinoma in situ of right breast: Secondary | ICD-10-CM | POA: Diagnosis not present

## 2022-08-20 LAB — RAD ONC ARIA SESSION SUMMARY
Course Elapsed Days: 8
Plan Fractions Treated to Date: 7
Plan Prescribed Dose Per Fraction: 2.66 Gy
Plan Total Fractions Prescribed: 16
Plan Total Prescribed Dose: 42.56 Gy
Reference Point Dosage Given to Date: 18.62 Gy
Reference Point Session Dosage Given: 2.66 Gy
Session Number: 7

## 2022-08-21 ENCOUNTER — Other Ambulatory Visit: Payer: Self-pay

## 2022-08-21 ENCOUNTER — Ambulatory Visit
Admission: RE | Admit: 2022-08-21 | Discharge: 2022-08-21 | Disposition: A | Discharge status: Home / Self Care | Payer: BC Managed Care – PPO | Source: Ambulatory Visit | Attending: Radiation Oncology | Admitting: Radiation Oncology

## 2022-08-21 DIAGNOSIS — D0511 Intraductal carcinoma in situ of right breast: Secondary | ICD-10-CM | POA: Diagnosis not present

## 2022-08-21 LAB — RAD ONC ARIA SESSION SUMMARY
Course Elapsed Days: 9
Plan Fractions Treated to Date: 8
Plan Prescribed Dose Per Fraction: 2.66 Gy
Plan Total Fractions Prescribed: 16
Plan Total Prescribed Dose: 42.56 Gy
Reference Point Dosage Given to Date: 21.28 Gy
Reference Point Session Dosage Given: 2.66 Gy
Session Number: 8

## 2022-08-22 ENCOUNTER — Ambulatory Visit
Admission: RE | Admit: 2022-08-22 | Discharge: 2022-08-22 | Disposition: A | Discharge status: Home / Self Care | Payer: BC Managed Care – PPO | Source: Ambulatory Visit | Attending: Radiation Oncology | Admitting: Radiation Oncology

## 2022-08-22 ENCOUNTER — Other Ambulatory Visit: Payer: Self-pay

## 2022-08-22 DIAGNOSIS — D0511 Intraductal carcinoma in situ of right breast: Secondary | ICD-10-CM | POA: Diagnosis not present

## 2022-08-22 LAB — RAD ONC ARIA SESSION SUMMARY
Course Elapsed Days: 10
Plan Fractions Treated to Date: 9
Plan Prescribed Dose Per Fraction: 2.66 Gy
Plan Total Fractions Prescribed: 16
Plan Total Prescribed Dose: 42.56 Gy
Reference Point Dosage Given to Date: 23.94 Gy
Reference Point Session Dosage Given: 2.66 Gy
Session Number: 9

## 2022-08-23 DIAGNOSIS — D0511 Intraductal carcinoma in situ of right breast: Secondary | ICD-10-CM | POA: Diagnosis not present

## 2022-08-25 ENCOUNTER — Other Ambulatory Visit: Payer: Self-pay

## 2022-08-25 ENCOUNTER — Ambulatory Visit
Admission: RE | Admit: 2022-08-25 | Discharge: 2022-08-25 | Disposition: A | Discharge status: Home / Self Care | Payer: BC Managed Care – PPO | Source: Ambulatory Visit | Attending: Radiation Oncology | Admitting: Radiation Oncology

## 2022-08-25 DIAGNOSIS — D0511 Intraductal carcinoma in situ of right breast: Secondary | ICD-10-CM | POA: Diagnosis not present

## 2022-08-25 LAB — RAD ONC ARIA SESSION SUMMARY
Course Elapsed Days: 13
Plan Fractions Treated to Date: 10
Plan Prescribed Dose Per Fraction: 2.66 Gy
Plan Total Fractions Prescribed: 16
Plan Total Prescribed Dose: 42.56 Gy
Reference Point Dosage Given to Date: 26.6 Gy
Reference Point Session Dosage Given: 2.66 Gy
Session Number: 10

## 2022-08-26 ENCOUNTER — Ambulatory Visit
Admission: RE | Admit: 2022-08-26 | Discharge: 2022-08-26 | Disposition: A | Discharge status: Home / Self Care | Payer: BC Managed Care – PPO | Source: Ambulatory Visit | Attending: Radiation Oncology | Admitting: Radiation Oncology

## 2022-08-26 ENCOUNTER — Other Ambulatory Visit: Payer: Self-pay

## 2022-08-26 DIAGNOSIS — D0511 Intraductal carcinoma in situ of right breast: Secondary | ICD-10-CM | POA: Diagnosis not present

## 2022-08-26 LAB — RAD ONC ARIA SESSION SUMMARY
Course Elapsed Days: 14
Plan Fractions Treated to Date: 11
Plan Prescribed Dose Per Fraction: 2.66 Gy
Plan Total Fractions Prescribed: 16
Plan Total Prescribed Dose: 42.56 Gy
Reference Point Dosage Given to Date: 29.26 Gy
Reference Point Session Dosage Given: 2.66 Gy
Session Number: 11

## 2022-08-27 ENCOUNTER — Ambulatory Visit
Admission: RE | Admit: 2022-08-27 | Discharge: 2022-08-27 | Disposition: A | Discharge status: Home / Self Care | Payer: BC Managed Care – PPO | Source: Ambulatory Visit | Attending: Radiation Oncology | Admitting: Radiation Oncology

## 2022-08-27 ENCOUNTER — Other Ambulatory Visit: Payer: Self-pay

## 2022-08-27 DIAGNOSIS — D0511 Intraductal carcinoma in situ of right breast: Secondary | ICD-10-CM | POA: Diagnosis not present

## 2022-08-27 LAB — RAD ONC ARIA SESSION SUMMARY
Course Elapsed Days: 15
Plan Fractions Treated to Date: 12
Plan Prescribed Dose Per Fraction: 2.66 Gy
Plan Total Fractions Prescribed: 16
Plan Total Prescribed Dose: 42.56 Gy
Reference Point Dosage Given to Date: 31.92 Gy
Reference Point Session Dosage Given: 2.66 Gy
Session Number: 12

## 2022-08-28 ENCOUNTER — Ambulatory Visit
Admission: RE | Admit: 2022-08-28 | Discharge: 2022-08-28 | Disposition: A | Discharge status: Home / Self Care | Payer: BC Managed Care – PPO | Source: Ambulatory Visit | Attending: Radiation Oncology | Admitting: Radiation Oncology

## 2022-08-28 ENCOUNTER — Other Ambulatory Visit: Payer: Self-pay

## 2022-08-28 DIAGNOSIS — D0511 Intraductal carcinoma in situ of right breast: Secondary | ICD-10-CM | POA: Diagnosis not present

## 2022-08-28 LAB — RAD ONC ARIA SESSION SUMMARY
Course Elapsed Days: 16
Plan Fractions Treated to Date: 13
Plan Prescribed Dose Per Fraction: 2.66 Gy
Plan Total Fractions Prescribed: 16
Plan Total Prescribed Dose: 42.56 Gy
Reference Point Dosage Given to Date: 34.58 Gy
Reference Point Session Dosage Given: 2.66 Gy
Session Number: 13

## 2022-08-29 ENCOUNTER — Other Ambulatory Visit: Payer: Self-pay

## 2022-08-29 ENCOUNTER — Ambulatory Visit: Payer: BC Managed Care – PPO | Admitting: Radiation Oncology

## 2022-08-29 ENCOUNTER — Ambulatory Visit
Admission: RE | Admit: 2022-08-29 | Discharge: 2022-08-29 | Disposition: A | Discharge status: Home / Self Care | Payer: BC Managed Care – PPO | Source: Ambulatory Visit | Attending: Radiation Oncology | Admitting: Radiation Oncology

## 2022-08-29 DIAGNOSIS — D0511 Intraductal carcinoma in situ of right breast: Secondary | ICD-10-CM | POA: Diagnosis not present

## 2022-08-29 LAB — RAD ONC ARIA SESSION SUMMARY
Course Elapsed Days: 17
Plan Fractions Treated to Date: 14
Plan Prescribed Dose Per Fraction: 2.66 Gy
Plan Total Fractions Prescribed: 16
Plan Total Prescribed Dose: 42.56 Gy
Reference Point Dosage Given to Date: 37.24 Gy
Reference Point Session Dosage Given: 2.66 Gy
Session Number: 14

## 2022-09-01 ENCOUNTER — Other Ambulatory Visit: Payer: Self-pay

## 2022-09-01 ENCOUNTER — Ambulatory Visit
Admission: RE | Admit: 2022-09-01 | Discharge: 2022-09-01 | Disposition: A | Discharge status: Home / Self Care | Payer: BC Managed Care – PPO | Source: Ambulatory Visit | Attending: Radiation Oncology | Admitting: Radiation Oncology

## 2022-09-01 DIAGNOSIS — D0511 Intraductal carcinoma in situ of right breast: Secondary | ICD-10-CM | POA: Diagnosis not present

## 2022-09-01 LAB — RAD ONC ARIA SESSION SUMMARY
Course Elapsed Days: 20
Plan Fractions Treated to Date: 15
Plan Prescribed Dose Per Fraction: 2.66 Gy
Plan Total Fractions Prescribed: 16
Plan Total Prescribed Dose: 42.56 Gy
Reference Point Dosage Given to Date: 39.9 Gy
Reference Point Session Dosage Given: 2.66 Gy
Session Number: 15

## 2022-09-02 ENCOUNTER — Other Ambulatory Visit: Payer: Self-pay

## 2022-09-02 ENCOUNTER — Ambulatory Visit
Admission: RE | Admit: 2022-09-02 | Discharge: 2022-09-02 | Disposition: A | Discharge status: Home / Self Care | Payer: BC Managed Care – PPO | Source: Ambulatory Visit | Attending: Radiation Oncology | Admitting: Radiation Oncology

## 2022-09-02 DIAGNOSIS — D0511 Intraductal carcinoma in situ of right breast: Secondary | ICD-10-CM | POA: Diagnosis not present

## 2022-09-02 LAB — RAD ONC ARIA SESSION SUMMARY
Course Elapsed Days: 21
Plan Fractions Treated to Date: 16
Plan Prescribed Dose Per Fraction: 2.66 Gy
Plan Total Fractions Prescribed: 16
Plan Total Prescribed Dose: 42.56 Gy
Reference Point Dosage Given to Date: 42.56 Gy
Reference Point Session Dosage Given: 2.66 Gy
Session Number: 16

## 2022-09-03 ENCOUNTER — Ambulatory Visit
Admission: RE | Admit: 2022-09-03 | Discharge: 2022-09-03 | Disposition: A | Discharge status: Home / Self Care | Payer: BC Managed Care – PPO | Source: Ambulatory Visit | Attending: Radiation Oncology | Admitting: Radiation Oncology

## 2022-09-03 ENCOUNTER — Other Ambulatory Visit: Payer: Self-pay

## 2022-09-03 DIAGNOSIS — D0511 Intraductal carcinoma in situ of right breast: Secondary | ICD-10-CM | POA: Diagnosis not present

## 2022-09-03 LAB — RAD ONC ARIA SESSION SUMMARY
Course Elapsed Days: 22
Plan Fractions Treated to Date: 1
Plan Prescribed Dose Per Fraction: 2.5 Gy
Plan Total Fractions Prescribed: 4
Plan Total Prescribed Dose: 10 Gy
Reference Point Dosage Given to Date: 2.5 Gy
Reference Point Session Dosage Given: 2.5 Gy
Session Number: 17

## 2022-09-04 ENCOUNTER — Other Ambulatory Visit: Payer: Self-pay

## 2022-09-04 ENCOUNTER — Ambulatory Visit
Admission: RE | Admit: 2022-09-04 | Discharge: 2022-09-04 | Disposition: A | Discharge status: Home / Self Care | Payer: BC Managed Care – PPO | Source: Ambulatory Visit | Attending: Radiation Oncology | Admitting: Radiation Oncology

## 2022-09-04 DIAGNOSIS — D0511 Intraductal carcinoma in situ of right breast: Secondary | ICD-10-CM | POA: Diagnosis not present

## 2022-09-04 LAB — RAD ONC ARIA SESSION SUMMARY
Course Elapsed Days: 23
Plan Fractions Treated to Date: 2
Plan Prescribed Dose Per Fraction: 2.5 Gy
Plan Total Fractions Prescribed: 4
Plan Total Prescribed Dose: 10 Gy
Reference Point Dosage Given to Date: 5 Gy
Reference Point Session Dosage Given: 2.5 Gy
Session Number: 18

## 2022-09-05 ENCOUNTER — Ambulatory Visit
Admission: RE | Admit: 2022-09-05 | Discharge: 2022-09-05 | Disposition: A | Discharge status: Home / Self Care | Payer: BC Managed Care – PPO | Source: Ambulatory Visit | Attending: Radiation Oncology | Admitting: Radiation Oncology

## 2022-09-05 ENCOUNTER — Other Ambulatory Visit: Payer: Self-pay

## 2022-09-05 DIAGNOSIS — D0511 Intraductal carcinoma in situ of right breast: Secondary | ICD-10-CM | POA: Diagnosis not present

## 2022-09-05 LAB — RAD ONC ARIA SESSION SUMMARY
Course Elapsed Days: 24
Plan Fractions Treated to Date: 3
Plan Prescribed Dose Per Fraction: 2.5 Gy
Plan Total Fractions Prescribed: 4
Plan Total Prescribed Dose: 10 Gy
Reference Point Dosage Given to Date: 7.5 Gy
Reference Point Session Dosage Given: 2.5 Gy
Session Number: 19

## 2022-09-05 MED ORDER — RADIAPLEXRX EX GEL: Freq: Once | CUTANEOUS | Status: AC

## 2022-09-08 ENCOUNTER — Other Ambulatory Visit: Payer: Self-pay

## 2022-09-08 ENCOUNTER — Ambulatory Visit
Admission: RE | Admit: 2022-09-08 | Discharge: 2022-09-08 | Disposition: A | Discharge status: Home / Self Care | Payer: BC Managed Care – PPO | Source: Ambulatory Visit | Attending: Radiation Oncology | Admitting: Radiation Oncology

## 2022-09-08 DIAGNOSIS — D0511 Intraductal carcinoma in situ of right breast: Secondary | ICD-10-CM | POA: Diagnosis not present

## 2022-09-08 LAB — RAD ONC ARIA SESSION SUMMARY
Course Elapsed Days: 27
Plan Fractions Treated to Date: 4
Plan Prescribed Dose Per Fraction: 2.5 Gy
Plan Total Fractions Prescribed: 4
Plan Total Prescribed Dose: 10 Gy
Reference Point Dosage Given to Date: 10 Gy
Reference Point Session Dosage Given: 2.5 Gy
Session Number: 20

## 2022-09-10 ENCOUNTER — Inpatient Hospital Stay: Payer: BC Managed Care – PPO | Attending: Hematology and Oncology | Admitting: Hematology and Oncology

## 2022-09-10 VITALS — BP 125/67 | HR 97 | Temp 97.5°F | Resp 18 | Ht 66.0 in | Wt 182.5 lb

## 2022-09-10 DIAGNOSIS — D0511 Intraductal carcinoma in situ of right breast: Secondary | ICD-10-CM

## 2022-09-10 DIAGNOSIS — Z800 Family history of malignant neoplasm of digestive organs: Secondary | ICD-10-CM | POA: Insufficient documentation

## 2022-09-10 DIAGNOSIS — Z79811 Long term (current) use of aromatase inhibitors: Secondary | ICD-10-CM | POA: Diagnosis not present

## 2022-09-10 MED ORDER — ANASTROZOLE 1 MG PO TABS: 1.0000 mg | ORAL_TABLET | Freq: Every day | ORAL | 3 refills | Status: DC

## 2022-09-10 NOTE — Assessment & Plan Note (Addendum)
This is a very pleasant 67 year old postmenopausal female patient with newly diagnosed right breast intermediate grade DCIS, ER/PR positive referred to breast [facility] for recommendations.  During the surgery, she was found to have 2 mm of grade 1 IDC ER/PR 100% and HER2 0, had positive margins for DCIS hence underwent reexcision with negative margins.  She is now completing adjuvant radiation and is here to initiate antiestrogen therapy.  Previously and today we have discussed about aromatase inhibitors which was her choice, mechanism of action of aromatase inhibitors, adverse effects including but not limited to postmenopausal symptoms such as hot flashes, vaginal dryness, bone density loss, questionable increased risk of cardiovascular events, arthralgias etc Anastrozole has been dispensed to the pharmacy of her choice. RTC in 3 months for SCP and tox check

## 2022-09-10 NOTE — Progress Notes (Signed)
[facility] CONSULT NOTE  Patient Care Team: [name], MD as PCP - General (Family Medicine) Pershing Proud, RN as Oncology Nurse Navigator Donnelly Angelica, RN as Oncology Nurse Navigator Rachel Moulds, MD as Consulting Physician (Hematology and Oncology) Manus Rudd, MD as Consulting Physician (General Surgery) Dorothy Puffer, MD as Consulting Physician (Radiation Oncology)  CHIEF COMPLAINTS/PURPOSE OF CONSULTATION:  Newly diagnosed breast cancer  HISTORY OF PRESENTING ILLNESS:  Madison Bryant 67 y.o. female is here because of recent diagnosis of right breast cancer  I reviewed her records extensively and collaborated the history with the patient.  SUMMARY OF ONCOLOGIC HISTORY: Oncology History  Malignant neoplasm of overlapping sites of right female breast (HCC)  05/15/2022 Mammogram   Screening mammogram showed possible focal asymmetry in the right breast which is indeterminate.  Additional views with possible ultrasound are recommended.  Diagnostic mammogram and ultrasound followed.  Ultrasound showed 1.3 x 1.6 x 1.5 cm irregular mass in the right breast suspicious for malignancy.   05/23/2022 Pathology Results   Pathology from the right breast needle core biopsy at 12:00 2 cm from the nipple shows intermediate grade DCIS, cribriform type, necrosis and calcs present.  Prognostic showed ER 100% positive strong staining PR 100% positive strong staining   06/02/2022 Initial Diagnosis   Malignant neoplasm of overlapping sites of right female breast [facility])   Ductal carcinoma in situ (DCIS) of right breast  06/02/2022 Initial Diagnosis   Ductal carcinoma in situ (DCIS) of right breast    This is a very pleasant 67 year old postmenopausal female patient who is newly diagnosed with right breast DCIS referred to breast [facility] for recommendations. She had lumpectomy on 06/20/2022 with final pathology showing grade 1 2 mm IDC, DCIS with positive margins hence  underwent reexcision.  Agnostic showed ER 100% positive strong staining PR 100% positive strong staining HER2 0 She underwent reexcision of positive margins and is now completing adjuvant radiation.  She is here to discuss adjuvant antiestrogen therapy. She is healing well from radiation, tolerated it well. Rest of the pertinent 10 point ROS reviewed and negative  MEDICAL HISTORY:  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)   Cellulitis    left foot   Family history of adverse reaction to anesthesia    sister cant use novacaine   GERD (gastroesophageal reflux disease)    OTC med prn   PONV (postoperative nausea and vomiting)    Wears glasses     SURGICAL HISTORY: Past Surgical History:  Procedure Laterality Date   ABDOMINAL HYSTERECTOMY     BREAST LUMPECTOMY WITH RADIOACTIVE SEED LOCALIZATION Right 06/20/2022   Procedure: RIGHT BREAST LUMPECTOMY WITH RADIOACTIVE SEED LOCALIZATION;  Surgeon: Manus Rudd, MD;  Location: [facility];  Service: General;  Laterality: Right;  LMA   COLONOSCOPY  2006   I & D EXTREMITY Left 10/09/2017   Procedure: IRRIGATION AND DEBRIDEMENT LEFT FOOT;  Surgeon: Tarry Kos, MD;  Location: [facility] OR;  Service: Orthopedics;  Laterality: Left;   ORIF FIBULA FRACTURE Left 08/21/2016   Procedure: OPEN REDUCTION INTERNAL FIXATION (ORIF) DISTAL FIBULA FRACTURE;  Surgeon: Loreta Ave, MD;  Location: [facility];  Service: Orthopedics;  Laterality: Left;   RE-EXCISION OF BREAST CANCER,SUPERIOR  MARGINS Right 07/01/2022   Procedure: RE-EXCISION OF SUPERIOR AND LATERAL MARGINS RIGHT BREAST;  Surgeon: Manus Rudd, MD;  Location: [facility];  Service: General;  Laterality: Right;   ROTATOR CUFF REPAIR Right    TONSILLECTOMY     TUBAL LIGATION      SOCIAL  HISTORY: Social History   Socioeconomic History   Marital status: Married    Spouse name: Not on file   Number of children: Not on file   Years of education: Not on file   Highest education level: Not on file  Occupational History   Not on file  Tobacco Use   Smoking status: Never   Smokeless tobacco: Never  Vaping Use   Vaping Use: Never used  Substance and Sexual Activity   Alcohol use: No   Drug use: No   Sexual activity: Yes  Other Topics Concern   Not on file  Social History Narrative   Not on file   Social Determinants of Health   Financial Resource Strain: Low Risk  (06/04/2022)   Overall Financial Resource Strain (CARDIA)    Difficulty of Paying Living Expenses: Not hard at all  Food Insecurity: No Food Insecurity (06/04/2022)   Hunger Vital Sign    Worried About Running Out of Food in the Last Year: Never true    Ran Out of Food in the Last Year: Never true  Transportation Needs: No Transportation Needs (06/04/2022)   PRAPARE - Administrator, Civil Service (Medical): No    Lack of Transportation (Non-Medical): No  Physical Activity: Not on file  Stress: Not on file  Social Connections: Not on file  Intimate Partner Violence: Not on file    FAMILY HISTORY: Family History  Problem Relation Age of Onset   Miscarriages / Stillbirths Mother    Colon cancer Paternal Aunt 66 - 19    ALLERGIES:  is allergic to erythromycin.  MEDICATIONS:  Current Outpatient Medications  Medication Sig Dispense Refill   anastrozole (ARIMIDEX) 1 MG tablet Take 1 tablet (1 mg total) by mouth daily. 90 tablet 3   acetaminophen (TYLENOL) 325 MG tablet Take 650 mg by mouth every 6 (six) hours as needed.     Azelastine HCl 137 MCG/SPRAY SOLN Place into both nostrils.     Cetirizine HCl (ZYRTEC PO) Take by mouth.     Cyanocobalamin (VITAMIN B 12 PO) Take by mouth.     ketoconazole (NIZORAL) 2 % shampoo SMARTSIG:sparingly Topical Twice a Week     Misc Natural Products  (NEURIVA PO) Take by mouth.     Multiple Vitamins-Minerals (MULTIVITAMIN ADULTS 50+) TABS Take by mouth.     vitamin C (ASCORBIC ACID) 500 MG tablet Take 500 mg by mouth daily.     VITAMIN D, CHOLECALCIFEROL, PO Take by mouth.     XIIDRA 5 % SOLN      No current facility-administered medications for this visit.    REVIEW OF SYSTEMS:   Constitutional: Denies fevers, chills or abnormal night sweats Eyes: Denies blurriness of vision, double vision or watery eyes Ears, nose, mouth, throat, and face: Denies mucositis or sore throat Respiratory: Denies cough, dyspnea or wheezes Cardiovascular: Denies palpitation, chest discomfort or lower extremity swelling Gastrointestinal:  Denies nausea, heartburn or change in bowel habits Skin: Denies abnormal skin rashes Lymphatics: Denies new lymphadenopathy or easy bruising Neurological:Denies numbness, tingling or new weaknesses Behavioral/Psych: Mood is stable, no new changes  Breast: Denies any palpable lumps or discharge All other systems  were reviewed with the patient and are negative.  PHYSICAL EXAMINATION: ECOG PERFORMANCE STATUS: 0 - Asymptomatic  Vitals:   09/10/22 1325  BP: 125/67  Pulse: 97  Resp: 18  Temp: (!) 97.5 F (36.4 C)  SpO2: 100%    Filed Weights   09/10/22 1325  Weight: 182 lb 8 oz (82.8 kg)     GENERAL:alert, no distress and comfortable   LABORATORY DATA:  I have reviewed the data as listed Lab Results  Component Value Date   WBC 3.9 (L) 06/04/2022   HGB 12.7 06/04/2022   HCT 36.9 06/04/2022   MCV 87.4 06/04/2022   PLT 243 06/04/2022   Lab Results  Component Value Date   NA 142 06/04/2022   K 3.9 06/04/2022   CL 107 06/04/2022   CO2 30 06/04/2022    RADIOGRAPHIC STUDIES: I have personally reviewed the radiological reports and agreed with the findings in the report.  ASSESSMENT AND PLAN:  Ductal carcinoma in situ (DCIS) of right breast This is a very pleasant 67 year old postmenopausal female  patient with newly diagnosed right breast intermediate grade DCIS, ER/PR positive referred to breast [facility] for recommendations.  During the surgery, she was found to have 2 mm of grade 1 IDC ER/PR 100% and HER2 0, had positive margins for DCIS hence underwent reexcision with negative margins.  She is now completing adjuvant radiation and is here to initiate antiestrogen therapy.  Previously and today we have discussed about aromatase inhibitors which was her choice, mechanism of action of aromatase inhibitors, adverse effects including but not limited to postmenopausal symptoms such as hot flashes, vaginal dryness, bone density loss, questionable increased risk of cardiovascular events, arthralgias etc Anastrozole has been dispensed to the pharmacy of her choice. RTC in 3 months for SCP and tox check  All questions were answered. The patient knows to call the clinic with any problems, questions or concerns.    Rachel Moulds, MD 09/10/22

## 2022-09-11 NOTE — Radiation Completion Notes (Addendum)
  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   Intent: Curative     ==========DELIVERED PLANS==========  First Treatment Date: 2022-08-12 - Last Treatment Date: 2022-09-08   Plan Name: Breast_R Site: Breast, Right Technique: 3D Mode: Photon Dose Per Fraction: 2.66 Gy Prescribed Dose (Delivered / Prescribed): 42.56 Gy / 42.56 Gy Prescribed Fxs (Delivered / Prescribed): 16 / 16   Plan Name: Breast_R_Bst Site: Breast, Right Technique: 3D Mode: Photon Dose Per Fraction: 2.5 Gy Prescribed Dose (Delivered / Prescribed): 10 Gy / 10 Gy Prescribed Fxs (Delivered / Prescribed): 4 / 4     ==========ON TREATMENT VISIT DATES========== 2022-08-15, 2022-08-22, 2022-08-29, 2022-09-05   See weekly On Treatment Notes is Epic for details. The patient tolerated radiation. She developed fatigue and anticipated skin changes in the treatment field.   The patient will receive a call in about one month from the radiation oncology department. She will continue follow up with Dr. Al Pimple as well.      Osker Mason, PAC

## 2022-09-29 ENCOUNTER — Encounter: Payer: Self-pay | Admitting: Family Medicine

## 2022-10-31 ENCOUNTER — Telehealth: Payer: Self-pay | Admitting: *Deleted

## 2022-10-31 ENCOUNTER — Other Ambulatory Visit: Payer: Self-pay

## 2022-10-31 NOTE — Telephone Encounter (Signed)
This RN spoke with pt per her VM-   Note pt began to cry at start of conversation " I just need to know if how I am feeling is normal since completing the radiation"  This RN allow pt time to cry- with recovery to continue conversation.  Kaiden states since completing radiation "I have no energy, I can't focus well, my balance is off " " [place] and only been down to the beach once"  Per discussion this RN validated what she is feeling - and that it is normal for post therapy.  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  This RN discussed reasons that she is feeling like she is - and that her body and mind needs time to process all she has had happened and allow her strength to build up. (Discussed support groups offered at this office)  This RN discussed benefits for improving her well being including limiting focused exercise, participating in support groups,and finding benefit and joy in an activity every day.  Discussed benefit from use of drugs that increase her serotonin (labeled under antidepressants).  Per end of discussion pt stated feeling better just with phone conversation- and knowing that "I am not going bonkers".   Pt declines need at present for anti depressants and will call if she feels her symptoms are not improving.  No further needs at this time.

## 2022-11-10 ENCOUNTER — Ambulatory Visit
Admission: RE | Admit: 2022-11-10 | Discharge: 2022-11-10 | Disposition: A | Discharge status: Home / Self Care | Payer: Self-pay | Source: Ambulatory Visit | Attending: Radiation Oncology | Admitting: Radiation Oncology

## 2022-11-10 NOTE — Progress Notes (Addendum)
  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 (as documented in provider EOT note)   The patient was available for call today.   Symptoms of fatigue have improved since completing therapy.  Symptoms of skin changes have improved since completing therapy but is still having some mild tenderness 3/10, around the incision site.   The patient was encouraged to avoid sun exposure in the area of prior treatment for up to one year following radiation with either sunscreen or by the style of clothing worn in the sun.  The patient has scheduled follow up with her medical oncologist Dr. Al Pimple for ongoing surveillance, and was encouraged to call if she develops concerns or questions regarding radiation.   This concludes the interaction.  Ruel Favors, LPN

## 2022-11-13 IMAGING — CR DG LUMBAR SPINE COMPLETE 4+V
5 series · 5 of 5 positions shown · non-contrast
Comparison: None.

CLINICAL DATA: Chronic back pain

EXAM:
LUMBAR SPINE - COMPLETE 4+ VIEW

[t lumbar spine ap]
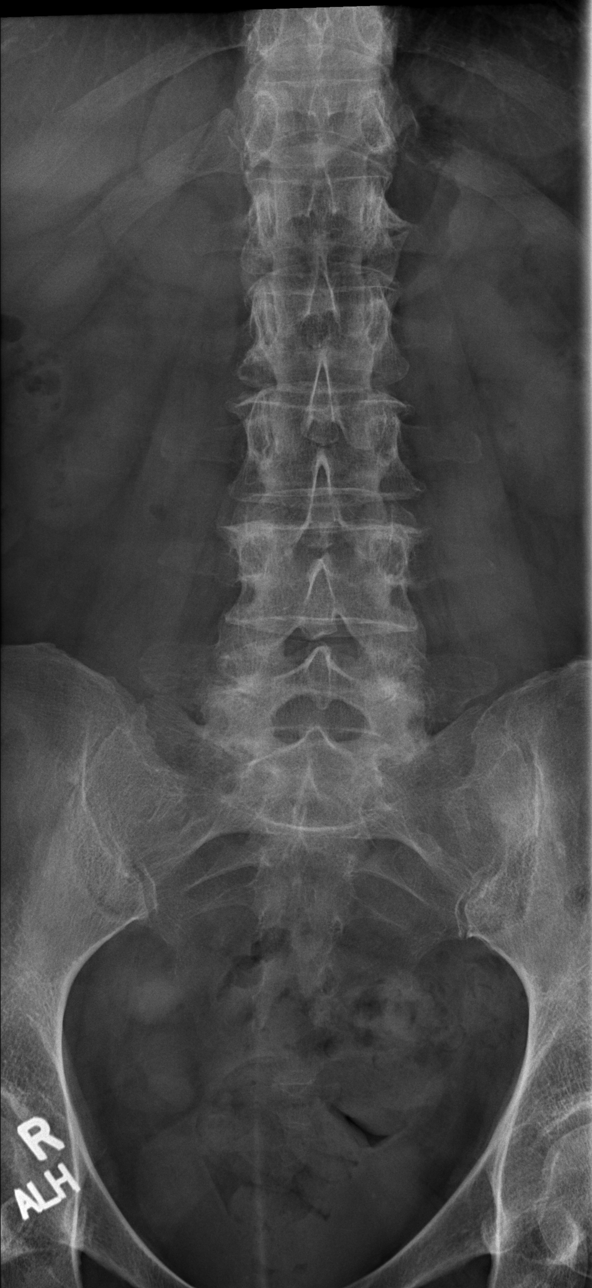

[t lumbar spine obl (1 of 2)]
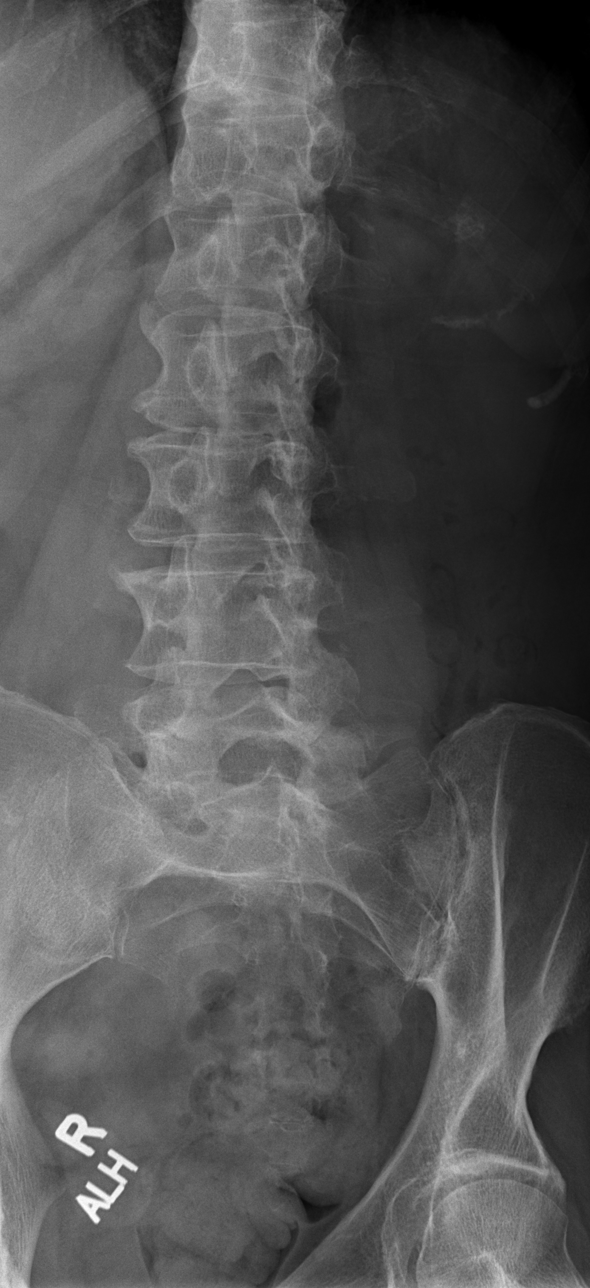

[t lumbar spine obl (2 of 2)]
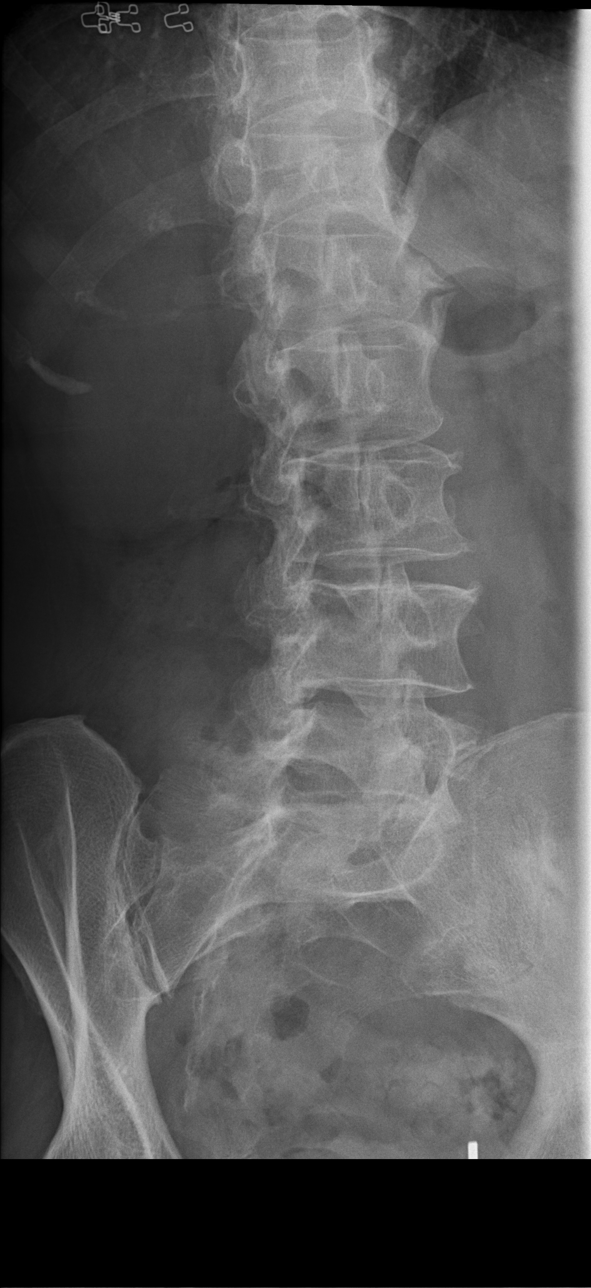

[t lumbar spine lat]
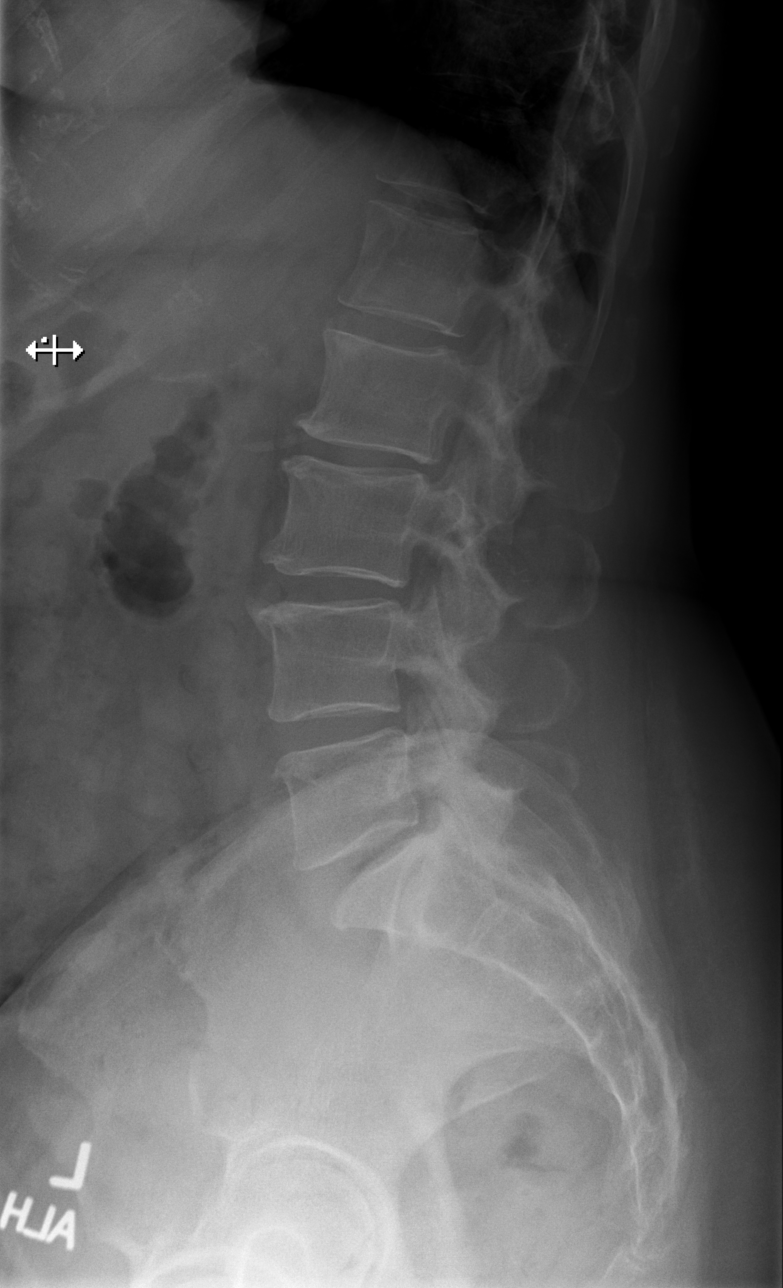

[t lumbar l-5 s-1 spot]
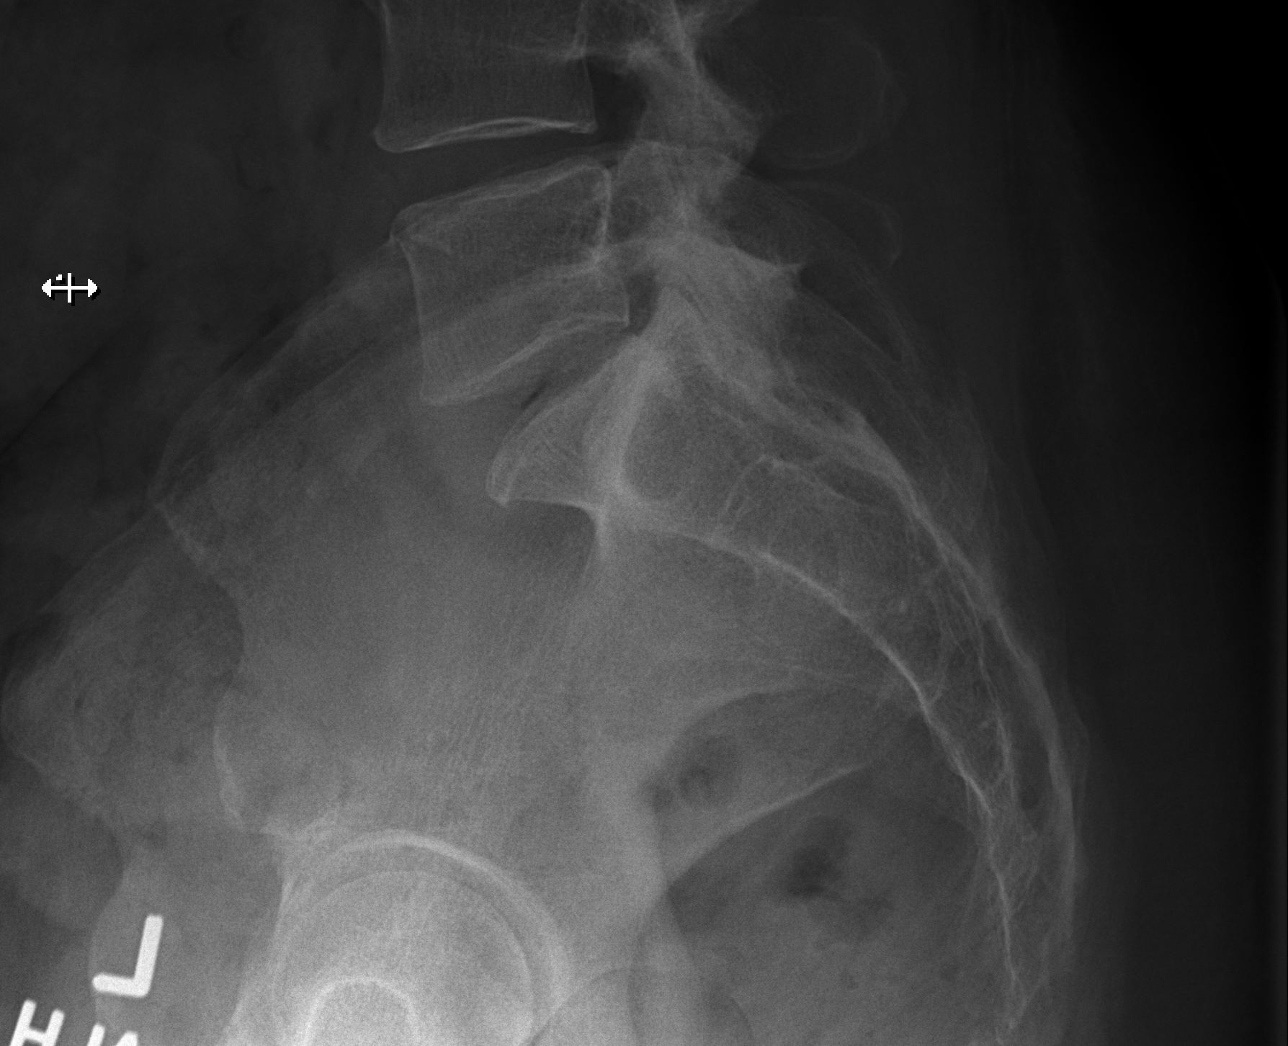

[5 of 5 positions shown; findings below may reference images not displayed]

FINDINGS: Lumbar alignment within normal limits. Vertebral body heights are
maintained. Mild disc space narrowing and degenerative change at
L2-L3 and L5-S1. Mild osteophyte at L3-L4 and L4-L5. Mild facet
degenerative change of the lower lumbar spine.
IMPRESSION: Mild degenerative changes.

## 2022-12-11 ENCOUNTER — Other Ambulatory Visit: Payer: Self-pay | Admitting: Adult Health

## 2022-12-11 DIAGNOSIS — D0511 Intraductal carcinoma in situ of right breast: Secondary | ICD-10-CM

## 2022-12-15 ENCOUNTER — Encounter: Payer: Self-pay | Admitting: Adult Health

## 2022-12-15 ENCOUNTER — Inpatient Hospital Stay: Payer: Medicare HMO

## 2022-12-15 ENCOUNTER — Inpatient Hospital Stay: Payer: Medicare HMO | Attending: Hematology and Oncology | Admitting: Adult Health

## 2022-12-15 ENCOUNTER — Other Ambulatory Visit: Payer: Self-pay

## 2022-12-15 VITALS — BP 125/89 | HR 85 | Temp 98.4°F | Resp 18 | Wt 184.8 lb

## 2022-12-15 DIAGNOSIS — Z923 Personal history of irradiation: Secondary | ICD-10-CM | POA: Diagnosis not present

## 2022-12-15 DIAGNOSIS — C50919 Malignant neoplasm of unspecified site of unspecified female breast: Secondary | ICD-10-CM | POA: Diagnosis not present

## 2022-12-15 DIAGNOSIS — Z170 Estrogen receptor positive status [ER+]: Secondary | ICD-10-CM | POA: Diagnosis not present

## 2022-12-15 DIAGNOSIS — Z79811 Long term (current) use of aromatase inhibitors: Secondary | ICD-10-CM | POA: Diagnosis not present

## 2022-12-15 DIAGNOSIS — D0511 Intraductal carcinoma in situ of right breast: Secondary | ICD-10-CM | POA: Diagnosis not present

## 2022-12-15 DIAGNOSIS — C50811 Malignant neoplasm of overlapping sites of right female breast: Secondary | ICD-10-CM | POA: Insufficient documentation

## 2022-12-15 DIAGNOSIS — Z823 Family history of stroke: Secondary | ICD-10-CM | POA: Diagnosis not present

## 2022-12-15 DIAGNOSIS — Z809 Family history of malignant neoplasm, unspecified: Secondary | ICD-10-CM | POA: Diagnosis not present

## 2022-12-15 DIAGNOSIS — Z91018 Allergy to other foods: Secondary | ICD-10-CM | POA: Diagnosis not present

## 2022-12-15 LAB — CBC WITH DIFFERENTIAL (CANCER CENTER ONLY)
Abs Immature Granulocytes: 0.01 10*3/uL (ref 0.00–0.07)
Basophils Absolute: 0 10*3/uL (ref 0.0–0.1)
Basophils Relative: 1 %
Eosinophils Absolute: 0.2 10*3/uL (ref 0.0–0.5)
Eosinophils Relative: 5 %
HCT: 36.6 % (ref 36.0–46.0)
Hemoglobin: 12.6 g/dL (ref 12.0–15.0)
Immature Granulocytes: 0 %
Lymphocytes Relative: 20 %
Lymphs Abs: 0.7 10*3/uL (ref 0.7–4.0)
MCH: 30.7 pg (ref 26.0–34.0)
MCHC: 34.4 g/dL (ref 30.0–36.0)
MCV: 89.3 fL (ref 80.0–100.0)
Monocytes Absolute: 0.5 10*3/uL (ref 0.1–1.0)
Monocytes Relative: 13 %
Neutro Abs: 2.2 10*3/uL (ref 1.7–7.7)
Neutrophils Relative %: 61 %
Platelet Count: 243 10*3/uL (ref 150–400)
RBC: 4.1 MIL/uL (ref 3.87–5.11)
RDW: 12.3 % (ref 11.5–15.5)
WBC Count: 3.6 10*3/uL — ABNORMAL LOW (ref 4.0–10.5)
nRBC: 0 % (ref 0.0–0.2)

## 2022-12-15 LAB — CMP (CANCER CENTER ONLY)
ALT: 25 U/L (ref 0–44)
AST: 21 U/L (ref 15–41)
Albumin: 4.3 g/dL (ref 3.5–5.0)
Alkaline Phosphatase: 88 U/L (ref 38–126)
Anion gap: 7 (ref 5–15)
BUN: 14 mg/dL (ref 8–23)
CO2: 28 mmol/L (ref 22–32)
Calcium: 9.2 mg/dL (ref 8.9–10.3)
Chloride: 106 mmol/L (ref 98–111)
Creatinine: 0.9 mg/dL (ref 0.44–1.00)
GFR, Estimated: >60 mL/min (ref >60)
Glucose, Bld: 121 mg/dL — ABNORMAL HIGH (ref 70–99)
Potassium: 4 mmol/L (ref 3.5–5.1)
Sodium: 141 mmol/L (ref 135–145)
Total Bilirubin: 0.6 mg/dL (ref 0.3–1.2)
Total Protein: 6.9 g/dL (ref 6.5–8.1)

## 2022-12-17 NOTE — Progress Notes (Signed)
SURVIVORSHIP VISIT:  BRIEF ONCOLOGIC HISTORY:  Oncology History  Malignant neoplasm of overlapping sites of right female breast (HCC)  05/15/2022 Mammogram   Screening mammogram showed possible focal asymmetry in the right breast which is indeterminate.  Additional views with possible ultrasound are recommended.  Diagnostic mammogram and ultrasound followed.  Ultrasound showed 1.3 x 1.6 x 1.5 cm irregular mass in the right breast suspicious for malignancy.   05/23/2022 Pathology Results   Pathology from the right breast needle core biopsy at 12:00 2 cm from the nipple shows intermediate grade DCIS, cribriform type, necrosis and calcs present.  Prognostic showed ER 100% positive strong staining PR 100% positive strong staining   06/20/2022 Surgery   Right lumpectomy: IDC, 0.2cm, grade 1, ER+, PR+, HER2 negative, Ki67-5%   07/01/2022 Surgery   Re-excision cleared margins    08/12/2022 - 09/08/2022 Radiation Therapy   Plan Name: Breast_R Site: Breast, Right Technique: 3D Mode: Photon Dose Per Fraction: 2.66 Gy Prescribed Dose (Delivered / Prescribed): 42.56 Gy / 42.56 Gy Prescribed Fxs (Delivered / Prescribed): 16 / 16   Plan Name: Breast_R_Bst Site: Breast, Right Technique: 3D Mode: Photon Dose Per Fraction: 2.5 Gy Prescribed Dose (Delivered / Prescribed): 10 Gy / 10 Gy Prescribed Fxs (Delivered / Prescribed): 4 / 4   09/2022 -  Anti-estrogen oral therapy   Anastrozole daily   12/15/2022 Cancer Staging   Staging form: Breast, AJCC 8th Edition - Pathologic: Stage IA (pT1a, pN0, cM0, G1, ER+, PR+, HER2-) - Signed by Loa Socks, NP on 12/15/2022 Histologic grading system: 3 grade system     INTERVAL HISTORY:  Madison Bryant to review her survivorship care plan detailing her treatment course for breast cancer, as well as monitoring long-term side effects of that treatment, education regarding health maintenance, screening, and overall wellness and health promotion.     Overall,  Madison Bryant reports feeling quite well.  She is taking anastrozole daily and tolerates it moderately well.  She is having some hair thinning and fatigue.    REVIEW OF SYSTEMS:  Review of Systems  Constitutional:  Positive for fatigue. Negative for appetite change, chills, fever and unexpected weight change.  HENT:   Negative for hearing loss, lump/mass and trouble swallowing.   Eyes:  Negative for eye problems and icterus.  Respiratory:  Negative for chest tightness, cough and shortness of breath.   Cardiovascular:  Negative for chest pain, leg swelling and palpitations.  Gastrointestinal:  Negative for abdominal distention, abdominal pain, constipation, diarrhea, nausea and vomiting.  Endocrine: Negative for hot flashes.  Genitourinary:  Negative for difficulty urinating.   Musculoskeletal:  Negative for arthralgias.  Skin:  Negative for itching and rash.  Neurological:  Negative for dizziness, extremity weakness, headaches and numbness.  Hematological:  Negative for adenopathy. Does not bruise/bleed easily.  Psychiatric/Behavioral:  Negative for depression. The patient is not nervous/anxious.    Breast: Denies any new nodularity, masses, tenderness, nipple changes, or nipple discharge.       PAST MEDICAL/SURGICAL HISTORY:  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)   Cellulitis    left foot   Family history of adverse reaction to anesthesia    sister cant use novacaine   GERD (gastroesophageal reflux disease)    OTC med prn  PONV (postoperative nausea and vomiting)    Wears glasses    Past Surgical History:  Procedure Laterality Date   ABDOMINAL HYSTERECTOMY     BREAST LUMPECTOMY WITH RADIOACTIVE SEED LOCALIZATION Right 06/20/2022   Procedure: RIGHT BREAST LUMPECTOMY WITH RADIOACTIVE SEED LOCALIZATION;  Surgeon: Manus Rudd, MD;  Location: [facility];  Service: General;  Laterality: Right;  LMA   COLONOSCOPY  2006   I & D EXTREMITY Left 10/09/2017   Procedure: IRRIGATION AND DEBRIDEMENT LEFT FOOT;  Surgeon: Tarry Kos, MD;  Location: [facility];  Service: Orthopedics;  Laterality: Left;   ORIF FIBULA FRACTURE Left 08/21/2016   Procedure: OPEN REDUCTION INTERNAL FIXATION (ORIF) DISTAL FIBULA FRACTURE;  Surgeon: Loreta Ave, MD;  Location: [facility];  Service: Orthopedics;  Laterality: Left;   RE-EXCISION OF BREAST CANCER,SUPERIOR MARGINS Right 07/01/2022   Procedure: RE-EXCISION OF SUPERIOR AND LATERAL MARGINS RIGHT BREAST;  Surgeon: Manus Rudd, MD;  Location: [facility];  Service: General;  Laterality: Right;   ROTATOR CUFF REPAIR Right    TONSILLECTOMY     TUBAL LIGATION       ALLERGIES:  Allergies  Allergen Reactions   Azithromycin     Other Reaction(s): stomach upset   Erythromycin Nausea And Vomiting and Other (See Comments)    GI UPSET     CURRENT MEDICATIONS:  Outpatient Encounter Medications as of 12/15/2022  Medication Sig   acetaminophen (TYLENOL) 325 MG tablet Take 650 mg by mouth every 6 (six) hours as needed.   anastrozole (ARIMIDEX) 1 MG tablet Take 1 tablet (1 mg total) by mouth daily.   Coenzyme Q10 (COQ10 PO) Take 1 tablet by mouth daily.   Cyanocobalamin (VITAMIN B 12 PO) Take by mouth.   ketoconazole (NIZORAL) 2 % shampoo SMARTSIG:sparingly Topical Twice a Week   Misc Natural Products (NEURIVA PO) Take by mouth.   Multiple Vitamins-Minerals (MULTIVITAMIN ADULTS 50+) TABS Take by mouth.   VITAMIN D, CHOLECALCIFEROL, PO Take by mouth.   XIIDRA 5 % SOLN    [DISCONTINUED] Azelastine HCl 137 MCG/SPRAY SOLN Place into both nostrils.   [DISCONTINUED] Cetirizine HCl (ZYRTEC PO) Take by mouth. (Patient not taking: Reported on 12/15/2022)   [DISCONTINUED] vitamin C (ASCORBIC ACID) 500 MG tablet Take 500 mg by mouth daily.   No  facility-administered encounter medications on file as of 12/15/2022.     ONCOLOGIC FAMILY HISTORY:  Family History  Problem Relation Age of Onset   Miscarriages / Stillbirths Mother    Colon cancer Paternal Aunt 86 - 63     SOCIAL HISTORY:  Social History   Socioeconomic History   Marital status: Married    Spouse name: Not on file   Number of children: Not on file   Years of education: Not on file   Highest education level: Not on file  Occupational History   Not on file  Tobacco Use   Smoking status: Never   Smokeless tobacco: Never  Vaping Use   Vaping status: Never Used  Substance and Sexual Activity   Alcohol use: No   Drug use: No   Sexual activity: Yes  Other Topics Concern   Not on file  Social History Narrative   Not on file   Social Determinants of Health   Financial Resource Strain: Low Risk  (06/04/2022)   Overall Financial Resource Strain (CARDIA)    Difficulty of Paying Living Expenses: Not hard at all  Food Insecurity: No Food Insecurity (06/04/2022)  Hunger Vital Sign    Worried About Running Out of Food in the Last Year: Never true    Ran Out of Food in the Last Year: Never true  Transportation Needs: No Transportation Needs (06/04/2022)   PRAPARE - Administrator, Civil Service (Medical): No    Lack of Transportation (Non-Medical): No  Physical Activity: Not on file  Stress: Not on file  Social Connections: Not on file  Intimate Partner Violence: Not on file     OBSERVATIONS/OBJECTIVE:  BP 125/89 (BP Location: Right Arm, Patient Position: Sitting)   Pulse 85   Temp 98.4 F (36.9 C) (Oral)   Resp 18   Wt 184 lb 12.8 oz (83.8 kg)   SpO2 97%   BMI 29.83 kg/m  GENERAL: Patient is a well appearing female in no acute distress HEENT:  Sclerae anicteric.  Oropharynx clear and moist. No ulcerations or evidence of oropharyngeal candidiasis. Neck is supple.  NODES:  No cervical, supraclavicular, or axillary lymphadenopathy palpated.   BREAST EXAM:  Right breast s/p lumpectomy and radiation, no sign of local recurrence, left breast benign. LUNGS:  Clear to auscultation bilaterally.  No wheezes or rhonchi. HEART:  Regular rate and rhythm. No murmur appreciated. ABDOMEN:  Soft, nontender.  Positive, normoactive bowel sounds. No organomegaly palpated. MSK:  No focal spinal tenderness to palpation. Full range of motion bilaterally in the upper extremities. EXTREMITIES:  No peripheral edema.   SKIN:  Clear with no obvious rashes or skin changes. No nail dyscrasia. NEURO:  Nonfocal. Well oriented.  Appropriate affect.   LABORATORY DATA:  None for this visit.  DIAGNOSTIC IMAGING:  None for this visit.      ASSESSMENT AND PLAN:  Madison Bryant is a pleasant 67 y.o. female with Stage IA right breast invasive ductal carcinoma, ER+/PR+/HER2-, diagnosed in 05/2022, treated with lumpectomy, adjuvant radiation therapy, and anti-estrogen therapy with Anastrozole beginning in 09/2022.  She presents to the Survivorship Clinic for our initial meeting and routine follow-up post-completion of treatment for breast cancer.    1. Stage IA right breast cancer:  Madison Bryant is continuing to recover from definitive treatment for breast cancer. She will follow-up with her medical oncologist, Dr.  Al Pimple in 6 months  with history and physical exam per surveillance protocol.  She will continue her anti-estrogen therapy with Anastrozole. Thus far, she is tolerating the Anastrozole moderately well, with minimal side effects. Her mammogram is due 06/2023; orders placed today.   Today, a comprehensive survivorship care plan and treatment summary was reviewed with the patient today detailing her breast cancer diagnosis, treatment course, potential late/long-term effects of treatment, appropriate follow-up care with recommendations for the future, and patient education resources.  A copy of this summary, along with a letter will be sent to the patient's primary  care provider via mail/fax/In Basket message after today's visit.    2. Bone health:  Given Madison Bryant age/history of breast cancer and her current treatment regimen including anti-estrogen therapy with anastrozole, she is at risk for bone demineralization.  She is scheduled for bone density testing in 03/2023.  She was given education on specific activities to promote bone health.  3. Cancer screening:  Due to Madison Bryant's history and her age, she should receive screening for skin cancers, colon cancer.  The information and recommendations are listed on the patient's comprehensive care plan/treatment summary and were reviewed in detail with the patient.    4. Health maintenance and wellness promotion: Madison Bryant was  encouraged to consume 5-7 servings of fruits and vegetables per day. We reviewed the "Nutrition Rainbow" handout.  She was also encouraged to engage in moderate to vigorous exercise for 30 minutes per day most days of the week.  She was instructed to limit her alcohol consumption and continue to abstain from tobacco use.     5. Support services/counseling: It is not uncommon for this period of the patient's cancer care trajectory to be one of many emotions and stressors. She was given information regarding our available services and encouraged to contact me with any questions or for help enrolling in any of our support group/programs.    Follow up instructions:    -Return to cancer center in 6 months for f/u with Dr. Al Pimple  -Mammogram due in 06/2023 -She is welcome to return back to the Survivorship Clinic at any time; no additional follow-up needed at this time.  -Consider referral back to survivorship as a long-term survivor for continued surveillance  The patient was provided an opportunity to ask questions and all were answered. The patient agreed with the plan and demonstrated an understanding of the instructions.   Total encounter time:40 minutes*in face-to-face visit time,  chart review, lab review, care coordination, order entry, and documentation of the encounter time.    Lillard Anes, NP 12/17/22 8:21 PM Medical Oncology and Hematology [facility] [address] [place], [place] [address] Tel. [phone]    Fax. [phone]  *Total Encounter Time as defined by the Centers for Medicare and Medicaid Services includes, in addition to the face-to-face time of a patient visit (documented in the note above) non-face-to-face time: obtaining and reviewing outside history, ordering and reviewing medications, tests or procedures, care coordination (communications with other health care professionals or caregivers) and documentation in the medical record.

## 2023-01-01 ENCOUNTER — Ambulatory Visit: Payer: Medicare HMO | Admitting: Orthopaedic Surgery

## 2023-01-01 ENCOUNTER — Encounter: Payer: Self-pay | Admitting: Orthopaedic Surgery

## 2023-01-01 ENCOUNTER — Other Ambulatory Visit (INDEPENDENT_AMBULATORY_CARE_PROVIDER_SITE_OTHER): Payer: Medicare HMO

## 2023-01-01 DIAGNOSIS — M79672 Pain in left foot: Secondary | ICD-10-CM

## 2023-01-01 MED ORDER — IBUPROFEN 800 MG PO TABS: 800.0000 mg | ORAL_TABLET | Freq: Three times a day (TID) | ORAL | 2 refills | Status: AC | PRN

## 2023-01-01 NOTE — Progress Notes (Signed)
Office Visit Note   Patient: Madison Bryant           Date of Birth: [birth year]           MRN: [id number] Visit Date: 01/01/2023              Requested by: [name], MD [address] [place],  [place] [address] PCP: [name], MD   Assessment & Plan: Visit Diagnoses:  1. Pain in left foot     Plan: Patient is a 67 year old female with left foot pain.  Etiology is unclear.  Could be related to recent cancer treatment for breast cancer.  I do not think this is related to her prior foot and ankle surgeries.  The pain seems to migrate around the foot.  I do not see anything concerning for infection or fracture.  I recommend activity modification and 2 weeks of Motrin.  She should follow-up if she does not notice any improvement.  Follow-Up Instructions: No follow-ups on file.   Orders:  Orders Placed This Encounter  Procedures   XR Foot Complete Left   Meds ordered this encounter  Medications   ibuprofen (ADVIL) 800 MG tablet    Sig: Take 1 tablet (800 mg total) by mouth every 8 (eight) hours as needed.    Dispense:  30 tablet    Refill:  2      Procedures: No procedures performed   Clinical Data: No additional findings.   Subjective: Chief Complaint  Patient presents with   Left Foot - Pain    HPI Madison Bryant is a 67 year old female comes in for evaluation of of left foot pain that is worse at the end of the day.  Reports pain and tenderness throughout the foot with tingling that migrates.  Had radiation in April for breast cancer.  She is not diabetic.  Takes Tylenol for the symptoms.  She is status post ankle fracture and left foot infection from a cat bite that I treated in 2019.  Review of Systems  Constitutional: Negative.   HENT: Negative.    Eyes: Negative.   Respiratory: Negative.    Cardiovascular: Negative.   Endocrine: Negative.   Musculoskeletal: Negative.   Neurological: Negative.   Hematological: Negative.    Psychiatric/Behavioral: Negative.    All other systems reviewed and are negative.    Objective: Vital Signs: There were no vitals taken for this visit.  Physical Exam Vitals and nursing note reviewed.  Constitutional:      Appearance: She is well-developed.  HENT:     Head: Atraumatic.     Nose: Nose normal.  Eyes:     Extraocular Movements: Extraocular movements intact.  Cardiovascular:     Pulses: Normal pulses.  Pulmonary:     Effort: Pulmonary effort is normal.  Abdominal:     Palpations: Abdomen is soft.  Musculoskeletal:     Cervical back: Neck supple.  Skin:    General: Skin is warm.     Capillary Refill: Capillary refill takes less than 2 seconds.  Neurological:     Mental Status: She is alert. Mental status is at baseline.  Psychiatric:        Behavior: Behavior normal.        Thought Content: Thought content normal.        Judgment: Judgment normal.     Ortho Exam Examination of the left foot shows mild pain with grind test of the great toe MTP joint.  No real tenderness of the plantar fascia or the Achilles.'s surgical scar over the ankle was fully healed.  There is no signs of infection on the dorsal aspect of the foot.  No neurovascular compromise. Specialty Comments:  No specialty comments available.  Imaging: XR Foot Complete Left  Result Date: 01/01/2023 X-rays demonstrate no acute findings.  She has plantar and dorsal calcaneal spur.  Generalized osteopenia.  Mild osteoarthritis throughout the foot.  She has moderate hallux rigidus.    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    Tenosynovitis of left foot    Foot abscess, left 10/09/2017   Eczema 07/24/2014   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)   Cellulitis    left foot   Family history of adverse reaction to anesthesia    sister cant use novacaine   GERD (gastroesophageal reflux disease)    OTC med prn   PONV (postoperative nausea and vomiting)    Wears glasses     Family History  Problem Relation Age of Onset   Miscarriages / Stillbirths Mother    Colon cancer Paternal Aunt 2 - 44    Past Surgical History:  Procedure Laterality Date   ABDOMINAL HYSTERECTOMY     BREAST LUMPECTOMY WITH RADIOACTIVE SEED LOCALIZATION Right 06/20/2022   Procedure: RIGHT BREAST LUMPECTOMY WITH RADIOACTIVE SEED LOCALIZATION;  Surgeon: Manus Rudd, MD;  Location: [facility];  Service: General;  Laterality: Right;  LMA   COLONOSCOPY  2006   I & D EXTREMITY Left 10/09/2017   Procedure: IRRIGATION AND DEBRIDEMENT LEFT FOOT;  Surgeon: Tarry Kos, MD;  Location: [facility];  Service: Orthopedics;  Laterality: Left;   ORIF FIBULA FRACTURE Left 08/21/2016   Procedure: OPEN REDUCTION INTERNAL FIXATION (ORIF) DISTAL FIBULA FRACTURE;  Surgeon: Loreta Ave, MD;  Location: [facility];  Service: Orthopedics;  Laterality: Left;   RE-EXCISION OF BREAST CANCER,SUPERIOR MARGINS Right 07/01/2022   Procedure: RE-EXCISION OF SUPERIOR AND LATERAL MARGINS RIGHT BREAST;  Surgeon: Manus Rudd, MD;  Location: [facility];  Service: General;  Laterality: Right;   ROTATOR CUFF REPAIR Right    TONSILLECTOMY     TUBAL LIGATION     Social History   Occupational History   Not on file  Tobacco Use   Smoking  status: Never   Smokeless tobacco: Never  Vaping Use   Vaping status: Never Used  Substance and Sexual Activity   Alcohol use: No   Drug use: No   Sexual activity: Yes

## 2023-01-07 ENCOUNTER — Encounter: Payer: Self-pay | Admitting: Family Medicine

## 2023-01-23 ENCOUNTER — Encounter: Payer: Self-pay | Admitting: Family Medicine

## 2023-01-27 ENCOUNTER — Other Ambulatory Visit: Payer: Self-pay | Admitting: Family Medicine

## 2023-01-27 MED ORDER — WEGOVY 0.5 MG/0.5ML ~~LOC~~ SOAJ: 0.5000 mg | SUBCUTANEOUS | 1 refills | Status: DC

## 2023-01-28 NOTE — Telephone Encounter (Signed)
Pharmacy comment: Script Clarification:NEEDING PRIOR AUTHORIZATION.

## 2023-02-04 DIAGNOSIS — D649 Anemia, unspecified: Secondary | ICD-10-CM | POA: Insufficient documentation

## 2023-02-04 DIAGNOSIS — E663 Overweight: Secondary | ICD-10-CM | POA: Insufficient documentation

## 2023-02-04 DIAGNOSIS — R21 Rash and other nonspecific skin eruption: Secondary | ICD-10-CM | POA: Insufficient documentation

## 2023-02-15 ENCOUNTER — Encounter: Payer: Self-pay | Admitting: Family Medicine

## 2023-03-04 ENCOUNTER — Encounter: Payer: Self-pay | Admitting: Family Medicine

## 2023-03-18 ENCOUNTER — Encounter: Payer: Self-pay | Admitting: Family Medicine

## 2023-03-20 ENCOUNTER — Other Ambulatory Visit: Payer: Self-pay | Admitting: Family Medicine

## 2023-03-20 MED ORDER — HYDROCODONE BIT-HOMATROP MBR 5-1.5 MG/5ML PO SOLN: 5.0000 mL | Freq: Three times a day (TID) | ORAL | 0 refills | Status: DC | PRN

## 2023-04-01 ENCOUNTER — Ambulatory Visit
Admission: RE | Admit: 2023-04-01 | Discharge: 2023-04-01 | Disposition: A | Discharge status: Home / Self Care | Payer: Medicare HMO | Source: Ambulatory Visit | Attending: Hematology and Oncology | Admitting: Hematology and Oncology

## 2023-04-01 DIAGNOSIS — D0511 Intraductal carcinoma in situ of right breast: Secondary | ICD-10-CM

## 2023-04-01 DIAGNOSIS — Z853 Personal history of malignant neoplasm of breast: Secondary | ICD-10-CM | POA: Diagnosis not present

## 2023-04-01 DIAGNOSIS — Z90722 Acquired absence of ovaries, bilateral: Secondary | ICD-10-CM | POA: Diagnosis not present

## 2023-04-01 DIAGNOSIS — E2839 Other primary ovarian failure: Secondary | ICD-10-CM | POA: Diagnosis not present

## 2023-04-27 ENCOUNTER — Telehealth: Payer: Self-pay | Admitting: Adult Health

## 2023-04-27 NOTE — Telephone Encounter (Signed)
Patient is aware of scheduled appointment times/dates due to provider being out of office

## 2023-05-15 DIAGNOSIS — Z853 Personal history of malignant neoplasm of breast: Secondary | ICD-10-CM | POA: Diagnosis not present

## 2023-05-15 DIAGNOSIS — R92333 Mammographic heterogeneous density, bilateral breasts: Secondary | ICD-10-CM | POA: Diagnosis not present

## 2023-05-15 LAB — HM MAMMOGRAPHY

## 2023-05-18 ENCOUNTER — Ambulatory Visit: Payer: Medicare HMO | Admitting: Adult Health

## 2023-05-18 ENCOUNTER — Encounter: Payer: Self-pay | Admitting: Family Medicine

## 2023-05-19 ENCOUNTER — Ambulatory Visit: Payer: Medicare HMO | Admitting: Adult Health

## 2023-05-22 ENCOUNTER — Encounter: Payer: Self-pay | Admitting: Adult Health

## 2023-05-22 ENCOUNTER — Inpatient Hospital Stay: Payer: Medicare Other | Attending: Adult Health | Admitting: Adult Health

## 2023-05-22 VITALS — BP 136/71 | HR 81 | Temp 98.0°F | Resp 16 | Wt 189.1 lb

## 2023-05-22 DIAGNOSIS — Z170 Estrogen receptor positive status [ER+]: Secondary | ICD-10-CM | POA: Insufficient documentation

## 2023-05-22 DIAGNOSIS — Z923 Personal history of irradiation: Secondary | ICD-10-CM | POA: Insufficient documentation

## 2023-05-22 DIAGNOSIS — Z1721 Progesterone receptor positive status: Secondary | ICD-10-CM | POA: Diagnosis not present

## 2023-05-22 DIAGNOSIS — C50811 Malignant neoplasm of overlapping sites of right female breast: Secondary | ICD-10-CM | POA: Diagnosis not present

## 2023-05-22 DIAGNOSIS — Z79811 Long term (current) use of aromatase inhibitors: Secondary | ICD-10-CM | POA: Insufficient documentation

## 2023-05-22 NOTE — Progress Notes (Signed)
 [facility] Cancer Follow up:    Madison Bryant DASEN, MD [address] [place] [address]   DIAGNOSIS:  Cancer Staging  Ductal carcinoma in situ (DCIS) of right breast Staging form: Breast, AJCC 8th Edition - Clinical stage from 06/04/2022: Stage 0 (cTis (DCIS), cN0, cM0, ER+, PR+, HER2: Equivocal) - Unsigned Stage prefix: Initial diagnosis Nuclear grade: G2 Laterality: Right Staged by: Pathologist and managing physician Stage used in treatment planning: Yes National guidelines used in treatment planning: Yes  Malignant neoplasm of overlapping sites of right female breast (HCC) Staging form: Breast, AJCC 8th Edition - Pathologic: Stage IA (pT1a, pN0, cM0, G1, ER+, PR+, HER2-) - Signed by Madison Madison Pickle, NP on 12/15/2022 Histologic grading system: 3 grade system   SUMMARY OF ONCOLOGIC HISTORY: Oncology History  Malignant neoplasm of overlapping sites of right female breast (HCC)  05/15/2022 Mammogram   Screening mammogram showed possible focal asymmetry in the right breast which is indeterminate.  Additional views with possible ultrasound are recommended.  Diagnostic mammogram and ultrasound followed.  Ultrasound showed 1.3 x 1.6 x 1.5 cm irregular mass in the right breast suspicious for malignancy.   05/23/2022 Pathology Results   Pathology from the right breast needle core biopsy at 12:00 2 cm from the nipple shows intermediate grade DCIS, cribriform type, necrosis and calcs present.  Prognostic showed ER 100% positive strong staining PR 100% positive strong staining   06/20/2022 Surgery   Right lumpectomy: IDC, 0.2cm, grade 1, ER+, PR+, HER2 negative, Ki67-5%   07/01/2022 Surgery   Re-excision cleared margins    08/12/2022 - 09/08/2022 Radiation Therapy   Plan Name: Breast_R Site: Breast, Right Technique: 3D Mode: Photon Dose Per Fraction: 2.66 Gy Prescribed Dose (Delivered / Prescribed): 42.56 Gy / 42.56 Gy Prescribed Fxs (Delivered /  Prescribed): 16 / 16   Plan Name: Breast_R_Bst Site: Breast, Right Technique: 3D Mode: Photon Dose Per Fraction: 2.5 Gy Prescribed Dose (Delivered / Prescribed): 10 Gy / 10 Gy Prescribed Fxs (Delivered / Prescribed): 4 / 4   09/2022 -  Anti-estrogen oral therapy   Anastrozole  daily   12/15/2022 Cancer Staging   Staging form: Breast, AJCC 8th Edition - Pathologic: Stage IA (pT1a, pN0, cM0, G1, ER+, PR+, HER2-) - Signed by Madison Madison Pickle, NP on 12/15/2022 Histologic grading system: 3 grade system   Ductal carcinoma in situ (DCIS) of right breast  06/02/2022 Initial Diagnosis   Ductal carcinoma in situ (DCIS) of right breast     CURRENT THERAPY: Anastrozole   INTERVAL HISTORY:  Discussed the use of AI scribe software for clinical note transcription with the patient, who gave verbal consent to proceed.  Madison Bryant 68 y.o. female with a history of invasive breast cancer, presents with ongoing fatigue and a lack of interest in activities. She reports feeling 'like a zombie' and has stopped engaging in previously enjoyed hobbies such as crafts. She also reports a lack of energy and a general disinterest in doing much of anything. She has been taking anastrozole , but is unsure if this is contributing to her symptoms. She has recently had two surgeries and radiation therapy, which may also be contributing to her current state. She also reports some discomfort in the chest area, which she describes as sore spots. She has recently had a mammogram, which was clear. She has not started taking ketoconazole or Hycodan due to insurance issues and personal concerns about the side effects. 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   Patient Active Problem List   Diagnosis Date Noted   Anemia 02/04/2023   Rash and nonspecific skin eruption 02/04/2023   Overweight with body mass index (BMI) 25.0-29.9 02/04/2023    Invasive ductal carcinoma of breast, female, right (HCC) 07/21/2022   Malignant neoplasm of overlapping sites of right female breast (HCC) 06/02/2022   Ductal carcinoma in situ (DCIS) of right breast 06/02/2022   Seasonal allergic rhinitis 11/05/2020   Anxiety 10/17/2020   Hallux rigidus of left foot 10/19/2019   Ganglion of flexor tendon sheath of right middle finger 03/03/2019   Radial styloid tenosynovitis of right hand 03/03/2019   GERD (gastroesophageal reflux disease) 01/26/2019   Osteoarthritis 02/08/2018   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  MEDICAL HISTORY: 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)   Cellulitis    left foot   Family history of adverse reaction to anesthesia    sister cant use novacaine   GERD (gastroesophageal reflux disease)    OTC med prn   PONV (postoperative nausea and vomiting)    Wears glasses     SURGICAL HISTORY: Past Surgical History:  Procedure Laterality Date   ABDOMINAL HYSTERECTOMY     BREAST LUMPECTOMY WITH RADIOACTIVE SEED LOCALIZATION Right 06/20/2022   Procedure: RIGHT BREAST LUMPECTOMY WITH RADIOACTIVE SEED LOCALIZATION;  Surgeon: Madison Cough, MD;  Location: [facility];  Service: General;  Laterality: Right;  LMA   COLONOSCOPY  2006   I & D EXTREMITY Left 10/09/2017   Procedure: IRRIGATION AND DEBRIDEMENT LEFT FOOT;  Surgeon: Madison Bryant HERO, MD;  Location: [facility];  Service: Orthopedics;  Laterality: Left;   ORIF FIBULA FRACTURE Left 08/21/2016   Procedure: OPEN REDUCTION INTERNAL FIXATION (ORIF) DISTAL FIBULA FRACTURE;   Surgeon: Madison Madison Chancy, MD;  Location: [facility];  Service: Orthopedics;  Laterality: Left;   RE-EXCISION OF BREAST CANCER,SUPERIOR MARGINS Right 07/01/2022   Procedure: RE-EXCISION OF SUPERIOR AND LATERAL MARGINS RIGHT BREAST;  Surgeon: Madison Cough, MD;  Location: [facility];  Service: General;  Laterality: Right;   ROTATOR CUFF REPAIR Right    TONSILLECTOMY     TUBAL LIGATION      SOCIAL HISTORY: Social History   Socioeconomic History   Marital status: Married    Spouse name: Not on file   Number of children: Not on file   Years of education: Not on file   Highest education level: Not on file  Occupational History   Not on file  Tobacco Use   Smoking status: Never   Smokeless tobacco: Never  Vaping Use   Vaping status: Never Used  Substance and Sexual Activity   Alcohol use: No   Drug use: No   Sexual activity: Yes  Other Topics Concern   Not on file  Social History Narrative   Not on file   Social Drivers of Health   Financial Resource Strain: Low Risk  (  06/04/2022)   Overall Financial Resource Strain (CARDIA)    Difficulty of Paying Living Expenses: Not hard at all  Food Insecurity: No Food Insecurity (06/04/2022)   Hunger Vital Sign    Worried About Running Out of Food in the Last Year: Never true    Ran Out of Food in the Last Year: Never true  Transportation Needs: No Transportation Needs (06/04/2022)   PRAPARE - Administrator, Civil Service (Medical): No    Lack of Transportation (Non-Medical): No  Physical Activity: Not on file  Stress: Not on file  Social Connections: Not on file  Intimate Partner Violence: Not on file    FAMILY HISTORY: Family History  Problem Relation Age of Onset   Miscarriages / Stillbirths Mother    Colon cancer Paternal Aunt 51 - 30    Review of Systems  Constitutional:  Negative for appetite change, chills, fatigue, fever and unexpected weight change.  HENT:   Negative for  hearing loss, lump/mass and trouble swallowing.   Eyes:  Negative for eye problems and icterus.  Respiratory:  Negative for chest tightness, Bryant and shortness of breath.   Cardiovascular:  Negative for chest pain, leg swelling and palpitations.  Gastrointestinal:  Negative for abdominal distention, abdominal pain, constipation, diarrhea, nausea and vomiting.  Endocrine: Negative for hot flashes.  Genitourinary:  Negative for difficulty urinating.   Musculoskeletal:  Negative for arthralgias.  Skin:  Negative for itching and rash.  Neurological:  Negative for dizziness, extremity weakness, headaches and numbness.  Hematological:  Negative for adenopathy. Does not bruise/bleed easily.  Psychiatric/Behavioral:  Negative for depression. The patient is not nervous/anxious.       PHYSICAL EXAMINATION    Vitals:   05/22/23 1035  BP: 136/71  Pulse: 81  Resp: 16  Temp: 98 F (36.7 C)  SpO2: 98%    Physical Exam Constitutional:      General: She is not in acute distress.    Appearance: Normal appearance. She is not toxic-appearing.  HENT:     Head: Normocephalic and atraumatic.     Mouth/Throat:     Mouth: Mucous membranes are moist.     Pharynx: Oropharynx is clear. No oropharyngeal exudate or posterior oropharyngeal erythema.  Eyes:     General: No scleral icterus. Cardiovascular:     Rate and Rhythm: Normal rate and regular rhythm.     Pulses: Normal pulses.     Heart sounds: Normal heart sounds.  Pulmonary:     Effort: Pulmonary effort is normal.     Breath sounds: Normal breath sounds.  Chest:     Comments: Right breast status post to me and radiation no sign of local recurrence left breast is benign Abdominal:     General: Abdomen is flat. Bowel sounds are normal. There is no distension.     Palpations: Abdomen is soft.     Tenderness: There is no abdominal tenderness.  Musculoskeletal:        General: No swelling.     Cervical back: Neck supple.   Lymphadenopathy:     Cervical: No cervical adenopathy.     Upper Body:     Right upper body: No axillary adenopathy.     Left upper body: No axillary adenopathy.  Skin:    General: Skin is warm and dry.     Findings: No rash.  Neurological:     General: No focal deficit present.     Mental Status: She is alert.  Psychiatric:  Mood and Affect: Mood normal.        Behavior: Behavior normal.     LABORATORY DATA:  CBC    Component Value Date/Time   WBC 3.6 (L) 12/15/2022 0928   WBC 4.5 10/03/2021 1240   RBC 4.10 12/15/2022 0928   HGB 12.6 12/15/2022 0928   HGB 12.1 06/08/2006 1608   HCT 36.6 12/15/2022 0928   HCT 34.3 (L) 06/08/2006 1608   PLT 243 12/15/2022 0928   PLT 339 06/08/2006 1608   MCV 89.3 12/15/2022 0928   MCV 88.4 06/08/2006 1608   MCH 30.7 12/15/2022 0928   MCHC 34.4 12/15/2022 0928   RDW 12.3 12/15/2022 0928   RDW 13.1 06/08/2006 1608   LYMPHSABS 0.7 12/15/2022 0928   LYMPHSABS 1.4 06/08/2006 1608   MONOABS 0.5 12/15/2022 0928   MONOABS 0.5 06/08/2006 1608   EOSABS 0.2 12/15/2022 0928   EOSABS 0.1 06/08/2006 1608   BASOSABS 0.0 12/15/2022 0928   BASOSABS 0.1 06/08/2006 1608    CMP     Component Value Date/Time   NA 141 12/15/2022 0928   K 4.0 12/15/2022 0928   CL 106 12/15/2022 0928   CO2 28 12/15/2022 0928   GLUCOSE 121 (H) 12/15/2022 0928   BUN 14 12/15/2022 0928   CREATININE 0.90 12/15/2022 0928   CREATININE 0.79 10/03/2021 1240   CALCIUM  9.2 12/15/2022 0928   PROT 6.9 12/15/2022 0928   ALBUMIN 4.3 12/15/2022 0928   AST 21 12/15/2022 0928   ALT 25 12/15/2022 0928   ALKPHOS 88 12/15/2022 0928   BILITOT 0.6 12/15/2022 0928   GFRNONAA >60 12/15/2022 0928   GFRNONAA 83 12/02/2017 1626   GFRAA 96 12/02/2017 1626      ASSESSMENT and THERAPY PLAN:   Malignant neoplasm of overlapping sites of right female breast [facility]) Breast Cancer Post-surgical and radiation therapy, currently on Anastrozole . Patient reports fatigue and lack of  interest in activities. No signs of recurrence on physical examination. Recent mammogram clear. -Stop Anastrozole  temporarily to assess if it is contributing to fatigue. -Plan to touch base in 4 weeks to assess symptom improvement and discuss potential alternative antiestrogen therapy if needed.  General Health Maintenance Change in insurance, new pharmacy. -Update prescription for Anastrozole  to new pharmacy [facility]) if needed after 4-week assessment.  All questions were answered. The patient knows to call the clinic with any problems, questions or concerns. We can certainly see the patient much sooner if necessary.  Total encounter time:20 minutes*in face-to-face visit time, chart review, lab review, care coordination, order entry, and documentation of the encounter time.    Madison Kendall, NP 05/26/23 12:49 PM Medical Oncology and Hematology [facility] [address] [place], [place] [address] Tel. [phone]    Fax. [phone]  *Total Encounter Time as defined by the Centers for Medicare and Medicaid Services includes, in addition to the face-to-face time of a patient visit (documented in the note above) non-face-to-face time: obtaining and reviewing outside history, ordering and reviewing medications, tests or procedures, care coordination (communications with other health care professionals or caregivers) and documentation in the medical record.

## 2023-05-26 NOTE — Assessment & Plan Note (Addendum)
 Breast Cancer Post-surgical and radiation therapy, currently on Anastrozole . Patient reports fatigue and lack of interest in activities. No signs of recurrence on physical examination. Recent mammogram clear. -Stop Anastrozole  temporarily to assess if it is contributing to fatigue. -Plan to touch base in 4 weeks to assess symptom improvement and discuss potential alternative antiestrogen therapy if needed.  General Health Maintenance Change in insurance, new pharmacy. -Update prescription for Anastrozole  to new pharmacy [facility]) if needed after 4-week assessment.

## 2023-06-18 ENCOUNTER — Ambulatory Visit: Payer: Medicare HMO | Admitting: Adult Health

## 2023-06-18 ENCOUNTER — Other Ambulatory Visit: Payer: Medicare HMO

## 2023-06-22 ENCOUNTER — Other Ambulatory Visit: Payer: Self-pay | Admitting: *Deleted

## 2023-06-22 DIAGNOSIS — C50811 Malignant neoplasm of overlapping sites of right female breast: Secondary | ICD-10-CM

## 2023-06-23 ENCOUNTER — Inpatient Hospital Stay: Payer: Medicare Other | Admitting: Adult Health

## 2023-06-23 ENCOUNTER — Inpatient Hospital Stay: Payer: Medicare Other | Attending: Adult Health

## 2023-06-24 ENCOUNTER — Other Ambulatory Visit: Payer: Self-pay | Admitting: *Deleted

## 2023-06-24 ENCOUNTER — Inpatient Hospital Stay: Payer: Medicare Other | Attending: Adult Health

## 2023-06-24 ENCOUNTER — Inpatient Hospital Stay: Payer: Medicare Other | Admitting: Adult Health

## 2023-06-24 VITALS — BP 131/75 | HR 67 | Temp 98.3°F | Resp 18 | Ht 66.0 in | Wt 187.3 lb

## 2023-06-24 DIAGNOSIS — D6861 Antiphospholipid syndrome: Secondary | ICD-10-CM | POA: Diagnosis not present

## 2023-06-24 DIAGNOSIS — C50811 Malignant neoplasm of overlapping sites of right female breast: Secondary | ICD-10-CM

## 2023-06-24 DIAGNOSIS — Z170 Estrogen receptor positive status [ER+]: Secondary | ICD-10-CM

## 2023-06-24 LAB — CMP (CANCER CENTER ONLY)
ALT: 19 U/L (ref 0–44)
AST: 17 U/L (ref 15–41)
Albumin: 4.1 g/dL (ref 3.5–5.0)
Alkaline Phosphatase: 82 U/L (ref 38–126)
Anion gap: 5 (ref 5–15)
BUN: 15 mg/dL (ref 8–23)
CO2: 30 mmol/L (ref 22–32)
Calcium: 9.3 mg/dL (ref 8.9–10.3)
Chloride: 105 mmol/L (ref 98–111)
Creatinine: 0.87 mg/dL (ref 0.44–1.00)
GFR, Estimated: >60 mL/min (ref >60)
Glucose, Bld: 132 mg/dL — ABNORMAL HIGH (ref 70–99)
Potassium: 4.1 mmol/L (ref 3.5–5.1)
Sodium: 140 mmol/L (ref 135–145)
Total Bilirubin: 0.5 mg/dL (ref 0.0–1.2)
Total Protein: 6.8 g/dL (ref 6.5–8.1)

## 2023-06-24 LAB — CBC WITH DIFFERENTIAL (CANCER CENTER ONLY)
Abs Immature Granulocytes: 0.01 10*3/uL (ref 0.00–0.07)
Basophils Absolute: 0 10*3/uL (ref 0.0–0.1)
Basophils Relative: 1 %
Eosinophils Absolute: 0.1 10*3/uL (ref 0.0–0.5)
Eosinophils Relative: 4 %
HCT: 35 % — ABNORMAL LOW (ref 36.0–46.0)
Hemoglobin: 11.7 g/dL — ABNORMAL LOW (ref 12.0–15.0)
Immature Granulocytes: 0 %
Lymphocytes Relative: 27 %
Lymphs Abs: 0.9 10*3/uL (ref 0.7–4.0)
MCH: 30.2 pg (ref 26.0–34.0)
MCHC: 33.4 g/dL (ref 30.0–36.0)
MCV: 90.4 fL (ref 80.0–100.0)
Monocytes Absolute: 0.5 10*3/uL (ref 0.1–1.0)
Monocytes Relative: 13 %
Neutro Abs: 1.9 10*3/uL (ref 1.7–7.7)
Neutrophils Relative %: 55 %
Platelet Count: 235 10*3/uL (ref 150–400)
RBC: 3.87 MIL/uL (ref 3.87–5.11)
RDW: 12.6 % (ref 11.5–15.5)
WBC Count: 3.5 10*3/uL — ABNORMAL LOW (ref 4.0–10.5)
nRBC: 0 % (ref 0.0–0.2)

## 2023-06-24 MED ORDER — LETROZOLE 2.5 MG PO TABS: 2.5000 mg | ORAL_TABLET | Freq: Every day | ORAL | 0 refills | Status: DC

## 2023-06-25 NOTE — Progress Notes (Signed)
[facility] Cancer Follow up:    [name], MD [address] [place] [address]   DIAGNOSIS:  Cancer Staging  Ductal carcinoma in situ (DCIS) of right breast Staging form: Breast, AJCC 8th Edition - Clinical stage from 06/04/2022: Stage 0 (cTis (DCIS), cN0, cM0, ER+, PR+, HER2: Equivocal) - Unsigned Stage prefix: Initial diagnosis Nuclear grade: G2 Laterality: Right Staged by: Pathologist and managing physician Stage used in treatment planning: Yes National guidelines used in treatment planning: Yes  Malignant neoplasm of overlapping sites of right female breast (HCC) Staging form: Breast, AJCC 8th Edition - Pathologic: Stage IA (pT1a, pN0, cM0, G1, ER+, PR+, HER2-) - Signed by Loa Socks, NP on 12/15/2022 Histologic grading system: 3 grade system   SUMMARY OF ONCOLOGIC HISTORY: Oncology History  Malignant neoplasm of overlapping sites of right female breast (HCC)  05/15/2022 Mammogram   Screening mammogram showed possible focal asymmetry in the right breast which is indeterminate.  Additional views with possible ultrasound are recommended.  Diagnostic mammogram and ultrasound followed.  Ultrasound showed 1.3 x 1.6 x 1.5 cm irregular mass in the right breast suspicious for malignancy.   05/23/2022 Pathology Results   Pathology from the right breast needle core biopsy at 12:00 2 cm from the nipple shows intermediate grade DCIS, cribriform type, necrosis and calcs present.  Prognostic showed ER 100% positive strong staining PR 100% positive strong staining   06/20/2022 Surgery   Right lumpectomy: IDC, 0.2cm, grade 1, ER+, PR+, HER2 negative, Ki67-5%   07/01/2022 Surgery   Re-excision cleared margins    08/12/2022 - 09/08/2022 Radiation Therapy   Plan Name: Breast_R Site: Breast, Right Technique: 3D Mode: Photon Dose Per Fraction: 2.66 Gy Prescribed Dose (Delivered / Prescribed): 42.56 Gy / 42.56 Gy Prescribed Fxs (Delivered /  Prescribed): 16 / 16   Plan Name: Breast_R_Bst Site: Breast, Right Technique: 3D Mode: Photon Dose Per Fraction: 2.5 Gy Prescribed Dose (Delivered / Prescribed): 10 Gy / 10 Gy Prescribed Fxs (Delivered / Prescribed): 4 / 4   09/2022 -  Anti-estrogen oral therapy   Anastrozole daily   12/15/2022 Cancer Staging   Staging form: Breast, AJCC 8th Edition - Pathologic: Stage IA (pT1a, pN0, cM0, G1, ER+, PR+, HER2-) - Signed by Loa Socks, NP on 12/15/2022 Histologic grading system: 3 grade system   Ductal carcinoma in situ (DCIS) of right breast  06/02/2022 Initial Diagnosis   Ductal carcinoma in situ (DCIS) of right breast     CURRENT THERAPY: observation  INTERVAL HISTORY:  Discussed the use of AI scribe software for clinical note transcription with the patient, who gave verbal consent to proceed.  Madison Bryant 68 y.o. female  with a history of early stage breast cancer, presents with concerns about the side effects of anastrozole. She reports feeling like a 'zombie,' with a lack of energy, loss of interest in activities, and a general feeling of malaise. Since discontinuing anastrozole, she reports feeling better, with increased energy and a sense of control. She has resumed activities she enjoys, such as walking and line dancing.  The patient also discusses the stress of caring for her sister, who has an autoimmune disease and is showing signs of cognitive decline. She expresses frustration with her sister's resistance to assistance and the challenges of managing her medications.  In addition, the patient mentions a history of testing positive for antiphospholipid antibody syndrome, but has not been retested recently. She expresses concern about her sister's diagnosis of  lupus and the potential for her own autoimmune issues to worsen.   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 (HCC) 06/02/2022   Ductal carcinoma in situ (DCIS) of right breast 06/02/2022   Seasonal allergic rhinitis 11/05/2020   Anxiety 10/17/2020   Hallux rigidus of left foot 10/19/2019   Ganglion of flexor tendon sheath of right middle finger 03/03/2019   Radial styloid tenosynovitis of right hand 03/03/2019   GERD (gastroesophageal reflux disease) 01/26/2019   Osteoarthritis 02/08/2018   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  MEDICAL HISTORY: 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)   Cellulitis    left foot   Family history of adverse reaction to anesthesia    sister cant use novacaine   GERD (gastroesophageal reflux disease)    OTC med prn   PONV (postoperative nausea and vomiting)    Wears glasses     SURGICAL HISTORY: Past Surgical History:  Procedure Laterality Date   ABDOMINAL HYSTERECTOMY     BREAST LUMPECTOMY WITH RADIOACTIVE SEED LOCALIZATION Right 06/20/2022   Procedure: RIGHT BREAST LUMPECTOMY WITH RADIOACTIVE SEED LOCALIZATION;  Surgeon: Manus Rudd, MD;  Location: [facility];  Service: General;  Laterality: Right;  LMA   COLONOSCOPY  2006   I & D EXTREMITY Left 10/09/2017   Procedure: IRRIGATION AND DEBRIDEMENT LEFT FOOT;  Surgeon: Tarry Kos, MD;  Location: [facility];  Service: Orthopedics;  Laterality: Left;   ORIF FIBULA FRACTURE Left 08/21/2016   Procedure:  OPEN REDUCTION INTERNAL FIXATION (ORIF) DISTAL FIBULA FRACTURE;  Surgeon: Loreta Ave, MD;  Location: [facility];  Service: Orthopedics;  Laterality: Left;   RE-EXCISION OF BREAST CANCER,SUPERIOR MARGINS Right 07/01/2022   Procedure: RE-EXCISION OF SUPERIOR AND LATERAL MARGINS RIGHT BREAST;  Surgeon: Manus Rudd, MD;  Location: [facility];  Service: General;  Laterality: Right;   ROTATOR CUFF REPAIR Right    TONSILLECTOMY     TUBAL LIGATION      SOCIAL HISTORY: Social History   Socioeconomic History   Marital status: Married    Spouse name: Not on file   Number of children: Not on file   Years of education: Not on file   Highest education level: Not on file  Occupational History   Not on file  Tobacco Use   Smoking status: Never   Smokeless tobacco: Never  Vaping Use   Vaping status: Never Used  Substance and Sexual Activity   Alcohol use: No   Drug use: No   Sexual activity: Yes  Other Topics Concern   Not on file  Social History Narrative   Not on file   Social Drivers of Health   Financial Resource Strain: Low Risk  (  06/04/2022)   Overall Financial Resource Strain (CARDIA)    Difficulty of Paying Living Expenses: Not hard at all  Food Insecurity: No Food Insecurity (06/04/2022)   Hunger Vital Sign    Worried About Running Out of Food in the Last Year: Never true    Ran Out of Food in the Last Year: Never true  Transportation Needs: No Transportation Needs (06/04/2022)   PRAPARE - Administrator, Civil Service (Medical): No    Lack of Transportation (Non-Medical): No  Physical Activity: Not on file  Stress: Not on file  Social Connections: Not on file  Intimate Partner Violence: Not on file    FAMILY HISTORY: Family History  Problem Relation Age of Onset   Miscarriages / Stillbirths Mother    Colon cancer Paternal Aunt 21 - 46    Review of Systems  Constitutional:  Negative for appetite change, chills, fatigue,  fever and unexpected weight change.  HENT:   Negative for hearing loss, lump/mass and trouble swallowing.   Eyes:  Negative for eye problems and icterus.  Respiratory:  Negative for chest tightness, cough and shortness of breath.   Cardiovascular:  Negative for chest pain, leg swelling and palpitations.  Gastrointestinal:  Negative for abdominal distention, abdominal pain, constipation, diarrhea, nausea and vomiting.  Endocrine: Negative for hot flashes.  Genitourinary:  Negative for difficulty urinating.   Musculoskeletal:  Negative for arthralgias.  Skin:  Negative for itching and rash.  Neurological:  Negative for dizziness, extremity weakness, headaches and numbness.  Hematological:  Negative for adenopathy. Does not bruise/bleed easily.  Psychiatric/Behavioral:  Negative for depression. The patient is not nervous/anxious.       PHYSICAL EXAMINATION    Vitals:   06/24/23 1006  BP: 131/75  Pulse: 67  Resp: 18  Temp: 98.3 F (36.8 C)  SpO2: 100%    Physical Exam Constitutional:      General: She is not in acute distress.    Appearance: Normal appearance. She is not toxic-appearing.  HENT:     Head: Normocephalic and atraumatic.     Mouth/Throat:     Mouth: Mucous membranes are moist.     Pharynx: Oropharynx is clear. No oropharyngeal exudate or posterior oropharyngeal erythema.  Eyes:     General: No scleral icterus. Cardiovascular:     Rate and Rhythm: Normal rate and regular rhythm.     Pulses: Normal pulses.     Heart sounds: Normal heart sounds.  Pulmonary:     Effort: Pulmonary effort is normal.     Breath sounds: Normal breath sounds.  Abdominal:     General: Abdomen is flat. Bowel sounds are normal. There is no distension.     Palpations: Abdomen is soft.     Tenderness: There is no abdominal tenderness.  Musculoskeletal:        General: No swelling.     Cervical back: Neck supple.  Lymphadenopathy:     Cervical: No cervical adenopathy.  Skin:     General: Skin is warm and dry.     Findings: No rash.  Neurological:     General: No focal deficit present.     Mental Status: She is alert.  Psychiatric:        Mood and Affect: Mood normal.        Behavior: Behavior normal.      ASSESSMENT and THERAPY PLAN:   Malignant neoplasm of overlapping sites of right female breast (HCC) Early Stage Breast Cancer Patient experienced significant fatigue,  anhedonia, and cognitive impairment on Anastrozole. Symptoms improved after discontinuation. Discussed the importance of antiestrogen therapy in preventing recurrence and metastasis, but also the need for quality of life. -Start Letrozole, 30-day supply. -Reevaluate in 4 weeks for tolerance and side effects.  Antiphospholipid Antibody Syndrome Patient has a history of elevated levels and is currently on aspirin. No recent testing. -Recommend retesting for antiphospholipid antibodies.    All questions were answered. The patient knows to call the clinic with any problems, questions or concerns. We can certainly see the patient much sooner if necessary.  Total encounter time:20 minutes*in face-to-face visit time, chart review, lab review, care coordination, order entry, and documentation of the encounter time.    Lillard Anes, NP 06/25/23 4:37 PM Medical Oncology and Hematology [facility] [address] [place], [place] [address] Tel. [phone]    Fax. [phone]  *Total Encounter Time as defined by the Centers for Medicare and Medicaid Services includes, in addition to the face-to-face time of a patient visit (documented in the note above) non-face-to-face time: obtaining and reviewing outside history, ordering and reviewing medications, tests or procedures, care coordination (communications with other health care professionals or caregivers) and documentation in the medical record.

## 2023-06-25 NOTE — Assessment & Plan Note (Signed)
Early Stage Breast Cancer Patient experienced significant fatigue, anhedonia, and cognitive impairment on Anastrozole. Symptoms improved after discontinuation. Discussed the importance of antiestrogen therapy in preventing recurrence and metastasis, but also the need for quality of life. -Start Letrozole, 30-day supply. -Reevaluate in 4 weeks for tolerance and side effects.  Antiphospholipid Antibody Syndrome Patient has a history of elevated levels and is currently on aspirin. No recent testing. -Recommend retesting for antiphospholipid antibodies.

## 2023-07-06 ENCOUNTER — Encounter: Payer: Self-pay | Admitting: Family Medicine

## 2023-07-06 ENCOUNTER — Ambulatory Visit (INDEPENDENT_AMBULATORY_CARE_PROVIDER_SITE_OTHER): Payer: Medicare Other | Admitting: Family Medicine

## 2023-07-06 VITALS — BP 130/72 | HR 100 | Temp 98.3°F | Ht 66.0 in | Wt 180.0 lb

## 2023-07-06 DIAGNOSIS — R739 Hyperglycemia, unspecified: Secondary | ICD-10-CM | POA: Diagnosis not present

## 2023-07-06 DIAGNOSIS — Z136 Encounter for screening for cardiovascular disorders: Secondary | ICD-10-CM | POA: Diagnosis not present

## 2023-07-06 MED ORDER — HYDROCODONE BIT-HOMATROP MBR 5-1.5 MG/5ML PO SOLN: 5.0000 mL | Freq: Three times a day (TID) | ORAL | 0 refills | Status: AC | PRN

## 2023-07-06 NOTE — Progress Notes (Signed)
 Subjective:    Patient ID: Madison Bryant, female    DOB: [birth year], 68 y.o.   MRN: [id number]  HPI   Recently, at her oncologist office, her blood sugar was found to be greater than 130.  She is unable to remember if she ate anything that morning.  Therefore this may not be a fasting blood sugar.  Her white blood cell count was mildly suppressed at 3.5.  The patient consistently runs between 3.3 and 4 for the last several years so this is in keeping with her average white blood cell count.  Her hemoglobin dropped slightly to 11.7.  She is typically in the low 12 range.  She is currently taking B12 but she is not on any iron pill. 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)   Cellulitis    left foot   Family history of adverse reaction to anesthesia    sister cant use novacaine   GERD (gastroesophageal reflux disease)    OTC med prn   PONV (postoperative nausea and vomiting)    Wears glasses    Past Surgical History:  Procedure Laterality Date   ABDOMINAL HYSTERECTOMY     BREAST LUMPECTOMY WITH RADIOACTIVE SEED LOCALIZATION Right 06/20/2022   Procedure: RIGHT BREAST LUMPECTOMY WITH RADIOACTIVE SEED LOCALIZATION;  Surgeon: Manus Rudd, MD;  Location: [facility];  Service: General;  Laterality: Right;  LMA   COLONOSCOPY  2006   I & D EXTREMITY Left 10/09/2017   Procedure: IRRIGATION AND DEBRIDEMENT LEFT FOOT;  Surgeon: Tarry Kos, MD;  Location: [facility];  Service: Orthopedics;  Laterality: Left;   ORIF FIBULA FRACTURE Left 08/21/2016   Procedure: OPEN REDUCTION INTERNAL FIXATION (ORIF) DISTAL FIBULA FRACTURE;  Surgeon: Loreta Ave, MD;  Location: [facility];  Service: Orthopedics;  Laterality: Left;   RE-EXCISION OF BREAST CANCER,SUPERIOR MARGINS Right 07/01/2022   Procedure: RE-EXCISION OF  SUPERIOR AND LATERAL MARGINS RIGHT BREAST;  Surgeon: Manus Rudd, MD;  Location: [facility];  Service: General;  Laterality: Right;   ROTATOR CUFF REPAIR Right    TONSILLECTOMY     TUBAL LIGATION     Current Outpatient Medications on File Prior to Visit  Medication Sig Dispense Refill   acetaminophen (TYLENOL) 325 MG tablet Take 650 mg by mouth every 6 (six) hours as needed. (Patient not taking: Reported on 06/24/2023)     Coenzyme Q10 (COQ10 PO) Take 1 tablet by mouth daily.     Cyanocobalamin (VITAMIN B 12 PO) Take by mouth.     ibuprofen (ADVIL) 800 MG tablet Take 1 tablet (800 mg total) by mouth every 8 (eight) hours as needed. (Patient not taking: Reported on 06/24/2023) 30 tablet 2   letrozole (FEMARA) 2.5 MG tablet Take 1 tablet (2.5 mg total) by mouth daily. 30 tablet 0   Misc Natural Products (NEURIVA PO) Take by mouth.     Multiple Vitamins-Minerals (MULTIVITAMIN ADULTS 50+) TABS Take by mouth.     VITAMIN D, CHOLECALCIFEROL, PO Take by mouth.     XIIDRA 5 % SOLN      No current facility-administered medications on file prior to visit.   Allergies  Allergen Reactions   Azithromycin     Other Reaction(s): stomach upset  Erythromycin Nausea And Vomiting and Other (See Comments)    GI UPSET   Social History   Socioeconomic History   Marital status: Married    Spouse name: Not on file   Number of children: Not on file   Years of education: Not on file   Highest education level: Not on file  Occupational History   Not on file  Tobacco Use   Smoking status: Never   Smokeless tobacco: Never  Vaping Use   Vaping status: Never Used  Substance and Sexual Activity   Alcohol use: No   Drug use: No   Sexual activity: Yes  Other Topics Concern   Not on file  Social History Narrative   Not on file   Social Drivers of Health   Financial Resource Strain: Low Risk  (06/04/2022)   Overall Financial Resource Strain (CARDIA)    Difficulty of Paying Living  Expenses: Not hard at all  Food Insecurity: No Food Insecurity (06/04/2022)   Hunger Vital Sign    Worried About Running Out of Food in the Last Year: Never true    Ran Out of Food in the Last Year: Never true  Transportation Needs: No Transportation Needs (06/04/2022)   PRAPARE - Administrator, Civil Service (Medical): No    Lack of Transportation (Non-Medical): No  Physical Activity: Not on file  Stress: Not on file  Social Connections: Not on file  Intimate Partner Violence: Not on file      Review of Systems  All other systems reviewed and are negative.      Objective:   Physical Exam Constitutional:      General: She is not in acute distress.    Appearance: Normal appearance. She is not ill-appearing, toxic-appearing or diaphoretic.  HENT:     Head: Normocephalic and atraumatic.     Right Ear: Tympanic membrane and ear canal normal.     Left Ear: Tympanic membrane and ear canal normal.     Nose: Nose normal. No congestion or rhinorrhea.     Mouth/Throat:     Mouth: Mucous membranes are moist.     Pharynx: Oropharynx is clear. No oropharyngeal exudate or posterior oropharyngeal erythema.  Eyes:     Extraocular Movements: Extraocular movements intact.     Conjunctiva/sclera: Conjunctivae normal.     Pupils: Pupils are equal, round, and reactive to light.  Neck:     Vascular: No carotid bruit.  Cardiovascular:     Rate and Rhythm: Normal rate and regular rhythm.     Pulses: Normal pulses.     Heart sounds: Normal heart sounds. No murmur heard.    No friction rub. No gallop.  Pulmonary:     Effort: Pulmonary effort is normal. No respiratory distress.     Breath sounds: Normal breath sounds. No stridor. No wheezing.  Abdominal:     General: Abdomen is flat. Bowel sounds are normal. There is no distension.     Palpations: Abdomen is soft.     Tenderness: There is no abdominal tenderness. There is no guarding or rebound.  Musculoskeletal:        General:  No swelling, tenderness, deformity or signs of injury.     Cervical back: Normal range of motion and neck supple. No rigidity or tenderness.     Right lower leg: No edema.     Left lower leg: No edema.  Lymphadenopathy:     Cervical: No cervical adenopathy.  Skin:    Findings:  No erythema or rash.  Neurological:     General: No focal deficit present.     Mental Status: She is alert and oriented to person, place, and time. Mental status is at baseline.     Motor: No weakness.     Gait: Gait normal.     Deep Tendon Reflexes: Reflexes normal.  Psychiatric:        Mood and Affect: Mood normal.        Behavior: Behavior normal.        Thought Content: Thought content normal.        Judgment: Judgment normal.           Assessment & Plan:  Elevated blood sugar - Plan: Hemoglobin A1c, COMPLETE METABOLIC PANEL WITH GFR, Lipid panel I will check a hemoglobin A1c to determine patient's blood sugars consistently elevated in the last 3 months.  Also check a fasting lipid panel.  Await the results of the blood work before determining any treatment.  His sugar may have been a nonfasting sample previously.  I am not concerned by her low white blood cell count.  I did recommend adding a multivitamin with iron and rechecking her CBC in 1 month.  Patient also has a recent upper respiratory infection with a cough.  I did prescribe the patient Hycodan 1 teaspoon every 6 hours as needed for cough

## 2023-07-07 LAB — COMPLETE METABOLIC PANEL WITH GFR
AG Ratio: 1.4 (calc) (ref 1.0–2.5)
ALT: 20 U/L (ref 6–29)
AST: 22 U/L (ref 10–35)
Albumin: 4.2 g/dL (ref 3.6–5.1)
Alkaline phosphatase (APISO): 90 U/L (ref 37–153)
BUN/Creatinine Ratio: 19 (calc) (ref 6–22)
BUN: 21 mg/dL (ref 7–25)
CO2: 29 mmol/L (ref 20–32)
Calcium: 9.5 mg/dL (ref 8.6–10.4)
Chloride: 101 mmol/L (ref 98–110)
Creat: 1.09 mg/dL — ABNORMAL HIGH (ref 0.50–1.05)
Globulin: 3 g/dL (ref 1.9–3.7)
Glucose, Bld: 143 mg/dL — ABNORMAL HIGH (ref 65–99)
Potassium: 4 mmol/L (ref 3.5–5.3)
Sodium: 142 mmol/L (ref 135–146)
Total Bilirubin: 0.4 mg/dL (ref 0.2–1.2)
Total Protein: 7.2 g/dL (ref 6.1–8.1)
eGFR: 56 mL/min/{1.73_m2} — ABNORMAL LOW (ref >=60)

## 2023-07-07 LAB — HEMOGLOBIN A1C
Hgb A1c MFr Bld: 6.4 %{Hb} — ABNORMAL HIGH (ref <5.7)
Mean Plasma Glucose: 137 mg/dL
eAG (mmol/L): 7.6 mmol/L

## 2023-07-07 LAB — LIPID PANEL
Cholesterol: 167 mg/dL (ref <200)
HDL: 49 mg/dL — ABNORMAL LOW (ref >=50)
LDL Cholesterol (Calc): 90 mg/dL
Non-HDL Cholesterol (Calc): 118 mg/dL (ref <130)
Total CHOL/HDL Ratio: 3.4 (calc) (ref <5.0)
Triglycerides: 180 mg/dL — ABNORMAL HIGH (ref <150)

## 2023-07-23 ENCOUNTER — Inpatient Hospital Stay: Payer: Medicare Other | Attending: Adult Health | Admitting: Adult Health

## 2023-07-23 ENCOUNTER — Encounter: Payer: Self-pay | Admitting: Adult Health

## 2023-07-23 DIAGNOSIS — Z79811 Long term (current) use of aromatase inhibitors: Secondary | ICD-10-CM | POA: Insufficient documentation

## 2023-07-23 DIAGNOSIS — C50811 Malignant neoplasm of overlapping sites of right female breast: Secondary | ICD-10-CM | POA: Insufficient documentation

## 2023-07-23 DIAGNOSIS — Z170 Estrogen receptor positive status [ER+]: Secondary | ICD-10-CM | POA: Insufficient documentation

## 2023-07-23 MED ORDER — LETROZOLE 2.5 MG PO TABS: 2.5000 mg | ORAL_TABLET | Freq: Every day | ORAL | 3 refills | Status: DC

## 2023-07-24 ENCOUNTER — Telehealth: Payer: Self-pay | Admitting: Adult Health

## 2023-07-24 NOTE — Telephone Encounter (Signed)
 Scheduled appointment per 3/14 los. Left VM with appointment details.

## 2023-07-24 NOTE — Assessment & Plan Note (Signed)
 Breast cancer Currently on Letrozole with no side effects. Missed three days due to illness but resumed. Satisfied with treatment. - Send updated refill for Letrozole to [facility].  Weight management Hesitant to start metformin, prefers lifestyle modifications. Recent 10-pound loss due to illness. Plans to improve diet and exercise by June. - Provide recipe handout for dietary changes. - Encourage exercise and weight management until June before reconsidering metformin.  Caregiver stress Experiencing stress from caregiving for sister with health issues. Considering assisted living or in-home care options. - Discuss options for assisted living or in-home care for her sister.  RTC in 6 months for f/u with Dr. Al Pimple

## 2023-07-24 NOTE — Progress Notes (Signed)
 [facility] Cancer Follow up:    [name], MD [address] [place] [address]   DIAGNOSIS:  Cancer Staging  Ductal carcinoma in situ (DCIS) of right breast Staging form: Breast, AJCC 8th Edition - Clinical stage from 06/04/2022: Stage 0 (cTis (DCIS), cN0, cM0, ER+, PR+, HER2: Equivocal) - Unsigned Stage prefix: Initial diagnosis Nuclear grade: G2 Laterality: Right Staged by: Pathologist and managing physician Stage used in treatment planning: Yes National guidelines used in treatment planning: Yes  Malignant neoplasm of overlapping sites of right female breast (HCC) Staging form: Breast, AJCC 8th Edition - Pathologic: Stage IA (pT1a, pN0, cM0, G1, ER+, PR+, HER2-) - Signed by Loa Socks, NP on 12/15/2022 Histologic grading system: 3 grade system   SUMMARY OF ONCOLOGIC HISTORY: Oncology History  Malignant neoplasm of overlapping sites of right female breast (HCC)  05/15/2022 Mammogram   Screening mammogram showed possible focal asymmetry in the right breast which is indeterminate.  Additional views with possible ultrasound are recommended.  Diagnostic mammogram and ultrasound followed.  Ultrasound showed 1.3 x 1.6 x 1.5 cm irregular mass in the right breast suspicious for malignancy.   05/23/2022 Pathology Results   Pathology from the right breast needle core biopsy at 12:00 2 cm from the nipple shows intermediate grade DCIS, cribriform type, necrosis and calcs present.  Prognostic showed ER 100% positive strong staining PR 100% positive strong staining   06/20/2022 Surgery   Right lumpectomy: IDC, 0.2cm, grade 1, ER+, PR+, HER2 negative, Ki67-5%   07/01/2022 Surgery   Re-excision cleared margins    08/12/2022 - 09/08/2022 Radiation Therapy   Plan Name: Breast_R Site: Breast, Right Technique: 3D Mode: Photon Dose Per Fraction: 2.66 Gy Prescribed Dose (Delivered / Prescribed): 42.56 Gy / 42.56 Gy Prescribed Fxs (Delivered /  Prescribed): 16 / 16   Plan Name: Breast_R_Bst Site: Breast, Right Technique: 3D Mode: Photon Dose Per Fraction: 2.5 Gy Prescribed Dose (Delivered / Prescribed): 10 Gy / 10 Gy Prescribed Fxs (Delivered / Prescribed): 4 / 4   09/2022 -  Anti-estrogen oral therapy   Anastrozole daily   12/15/2022 Cancer Staging   Staging form: Breast, AJCC 8th Edition - Pathologic: Stage IA (pT1a, pN0, cM0, G1, ER+, PR+, HER2-) - Signed by Loa Socks, NP on 12/15/2022 Histologic grading system: 3 grade system   Ductal carcinoma in situ (DCIS) of right breast  06/02/2022 Initial Diagnosis   Ductal carcinoma in situ (DCIS) of right breast     CURRENT THERAPY: letrozole  INTERVAL HISTORY:  Discussed the use of AI scribe software for clinical note transcription with the patient, who gave verbal consent to proceed.  Madison Bryant 68 y.o. female  for follow-up after recent lab work and discussion of starting metformin with her PCP. She expresses hesitancy to start metformin due to recent illness and poor adherence to diet and exercise. She has been dealing with the flu and other illnesses since the end of February. She expresses a desire to monitor her diet and start exercising before considering medication. She has recently lost 10 pounds, which she attributes to illness.  The patient also discusses her experience with Letrozole for her breast cancer, which she started recently. She reports missing a few doses due to illness but has otherwise tolerated the medication well, without the side effects she experienced with anastrozole. She requests a refill for this medication.  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 She is considering assisted living or in-home care for her sister, who recently had a health scare related to poor nutrition.   Patient Active Problem List   Diagnosis Date Noted   Anemia 02/04/2023    Rash and nonspecific skin eruption 02/04/2023   Overweight with body mass index (BMI) 25.0-29.9 02/04/2023   Invasive ductal carcinoma of breast, female, right (HCC) 07/21/2022   Malignant neoplasm of overlapping sites of right female breast (HCC) 06/02/2022   Ductal carcinoma in situ (DCIS) of right breast 06/02/2022   Seasonal allergic rhinitis 11/05/2020   Anxiety 10/17/2020   Hallux rigidus of left foot 10/19/2019   Ganglion of flexor tendon sheath of right middle finger 03/03/2019   Radial styloid tenosynovitis of right hand 03/03/2019   GERD (gastroesophageal reflux disease) 01/26/2019   Osteoarthritis 02/08/2018   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  MEDICAL HISTORY: 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)   Cellulitis    left foot   Family history of adverse reaction to anesthesia    sister cant use novacaine   GERD (gastroesophageal reflux disease)    OTC med prn   PONV (postoperative nausea and vomiting)    Wears glasses     SURGICAL HISTORY: Past Surgical History:  Procedure Laterality Date   ABDOMINAL HYSTERECTOMY     BREAST LUMPECTOMY WITH RADIOACTIVE SEED LOCALIZATION Right 06/20/2022   Procedure: RIGHT BREAST LUMPECTOMY WITH RADIOACTIVE SEED LOCALIZATION;  Surgeon: Manus Rudd, MD;  Location: [facility];  Service: General;  Laterality: Right;  LMA   COLONOSCOPY  2006   I & D EXTREMITY Left 10/09/2017   Procedure: IRRIGATION AND DEBRIDEMENT LEFT FOOT;  Surgeon: Tarry Kos, MD;  Location: [facility];  Service: Orthopedics;  Laterality: Left;   ORIF  FIBULA FRACTURE Left 08/21/2016   Procedure: OPEN REDUCTION INTERNAL FIXATION (ORIF) DISTAL FIBULA FRACTURE;  Surgeon: Loreta Ave, MD;  Location: [facility];  Service: Orthopedics;  Laterality: Left;   RE-EXCISION OF BREAST CANCER,SUPERIOR MARGINS Right 07/01/2022   Procedure: RE-EXCISION OF SUPERIOR AND LATERAL MARGINS RIGHT BREAST;  Surgeon: Manus Rudd, MD;  Location: [facility];  Service: General;  Laterality: Right;   ROTATOR CUFF REPAIR Right    TONSILLECTOMY     TUBAL LIGATION      SOCIAL HISTORY: Social History   Socioeconomic History   Marital status: Married    Spouse name: Not on file   Number of children: Not on file   Years of education: Not on file   Highest education level: Not on file  Occupational History   Not on file  Tobacco Use   Smoking status: Never   Smokeless tobacco: Never  Vaping Use   Vaping status: Never Used  Substance and Sexual Activity   Alcohol use: No   Drug use: No   Sexual activity: Yes  Other Topics Concern   Not on file  Social History Narrative   Not on file  Social Drivers of Corporate investment banker Strain: Low Risk  (06/04/2022)   Overall Financial Resource Strain (CARDIA)    Difficulty of Paying Living Expenses: Not hard at all  Food Insecurity: No Food Insecurity (06/04/2022)   Hunger Vital Sign    Worried About Running Out of Food in the Last Year: Never true    Ran Out of Food in the Last Year: Never true  Transportation Needs: No Transportation Needs (06/04/2022)   PRAPARE - Administrator, Civil Service (Medical): No    Lack of Transportation (Non-Medical): No  Physical Activity: Not on file  Stress: Not on file  Social Connections: Not on file  Intimate Partner Violence: Not on file    FAMILY HISTORY: Family History  Problem Relation Age of Onset   Miscarriages / Stillbirths Mother    Colon cancer Paternal Aunt 49 - 31    Review of Systems  Constitutional:   Negative for appetite change, chills, fatigue, fever and unexpected weight change.  HENT:   Negative for hearing loss, lump/mass and trouble swallowing.   Eyes:  Negative for eye problems and icterus.  Respiratory:  Negative for chest tightness, cough and shortness of breath.   Cardiovascular:  Negative for chest pain, leg swelling and palpitations.  Gastrointestinal:  Negative for abdominal distention, abdominal pain, constipation, diarrhea, nausea and vomiting.  Endocrine: Negative for hot flashes.  Genitourinary:  Negative for difficulty urinating.   Musculoskeletal:  Negative for arthralgias.  Skin:  Negative for itching and rash.  Neurological:  Negative for dizziness, extremity weakness, headaches and numbness.  Hematological:  Negative for adenopathy. Does not bruise/bleed easily.  Psychiatric/Behavioral:  Negative for depression. The patient is not nervous/anxious.       PHYSICAL EXAMINATION    Vitals:   07/23/23 1210  BP: (!) 151/79  Pulse: 78  Resp: 18  Temp: 97.6 F (36.4 C)  SpO2: 100%  Deferred in lieu of counseling.  Patient appears well and is in no apparent distress.  LABORATORY DATA:  CBC    Component Value Date/Time   WBC 3.5 (L) 06/24/2023 0921   WBC 4.5 10/03/2021 1240   RBC 3.87 06/24/2023 0921   HGB 11.7 (L) 06/24/2023 0921   HGB 12.1 06/08/2006 1608   HCT 35.0 (L) 06/24/2023 0921   HCT 34.3 (L) 06/08/2006 1608   PLT 235 06/24/2023 0921   PLT 339 06/08/2006 1608   MCV 90.4 06/24/2023 0921   MCV 88.4 06/08/2006 1608   MCH 30.2 06/24/2023 0921   MCHC 33.4 06/24/2023 0921   RDW 12.6 06/24/2023 0921   RDW 13.1 06/08/2006 1608   LYMPHSABS 0.9 06/24/2023 0921   LYMPHSABS 1.4 06/08/2006 1608   MONOABS 0.5 06/24/2023 0921   MONOABS 0.5 06/08/2006 1608   EOSABS 0.1 06/24/2023 0921   EOSABS 0.1 06/08/2006 1608   BASOSABS 0.0 06/24/2023 0921   BASOSABS 0.1 06/08/2006 1608    CMP     Component Value Date/Time   NA 142 07/06/2023 0823   K 4.0  07/06/2023 0823   CL 101 07/06/2023 0823   CO2 29 07/06/2023 0823   GLUCOSE 143 (H) 07/06/2023 0823   BUN 21 07/06/2023 0823   CREATININE 1.09 (H) 07/06/2023 0823   CALCIUM 9.5 07/06/2023 0823   PROT 7.2 07/06/2023 0823   ALBUMIN 4.1 06/24/2023 0921   AST 22 07/06/2023 0823   AST 17 06/24/2023 0921   ALT 20 07/06/2023 0823   ALT 19 06/24/2023 0921   ALKPHOS 82  06/24/2023 0921   BILITOT 0.4 07/06/2023 0823   BILITOT 0.5 06/24/2023 0921   GFRNONAA >60 06/24/2023 0921   GFRNONAA 83 12/02/2017 1626   GFRAA 96 12/02/2017 1626      ASSESSMENT and THERAPY PLAN:   Malignant neoplasm of overlapping sites of right female breast (HCC) Breast cancer Currently on Letrozole with no side effects. Missed three days due to illness but resumed. Satisfied with treatment. - Send updated refill for Letrozole to [facility].  Weight management Hesitant to start metformin, prefers lifestyle modifications. Recent 10-pound loss due to illness. Plans to improve diet and exercise by June. - Provide recipe handout for dietary changes. - Encourage exercise and weight management until June before reconsidering metformin.  Caregiver stress Experiencing stress from caregiving for sister with health issues. Considering assisted living or in-home care options. - Discuss options for assisted living or in-home care for her sister.  RTC in 6 months for f/u with Dr. Al Pimple   All questions were answered. The patient knows to call the clinic with any problems, questions or concerns. We can certainly see the patient much sooner if necessary.  Total encounter time:20 minutes*in face-to-face visit time, chart review, lab review, care coordination, order entry, and documentation of the encounter time.    Lillard Anes, NP 07/24/23 3:17 PM Medical Oncology and Hematology [facility] [address] [place], [place] [address] Tel. [phone]    Fax. [phone]  *Total Encounter  Time as defined by the Centers for Medicare and Medicaid Services includes, in addition to the face-to-face time of a patient visit (documented in the note above) non-face-to-face time: obtaining and reviewing outside history, ordering and reviewing medications, tests or procedures, care coordination (communications with other health care professionals or caregivers) and documentation in the medical record.

## 2023-08-15 ENCOUNTER — Encounter: Payer: Self-pay | Admitting: Family Medicine

## 2023-09-14 DIAGNOSIS — K080 Exfoliation of teeth due to systemic causes: Secondary | ICD-10-CM | POA: Diagnosis not present

## 2023-10-06 DIAGNOSIS — K080 Exfoliation of teeth due to systemic causes: Secondary | ICD-10-CM | POA: Diagnosis not present

## 2023-11-16 ENCOUNTER — Ambulatory Visit (INDEPENDENT_AMBULATORY_CARE_PROVIDER_SITE_OTHER): Admitting: Family Medicine

## 2023-11-16 ENCOUNTER — Encounter: Payer: Self-pay | Admitting: Family Medicine

## 2023-11-16 VITALS — BP 120/76 | HR 89 | Ht 66.0 in | Wt 178.4 lb

## 2023-11-16 DIAGNOSIS — L249 Irritant contact dermatitis, unspecified cause: Secondary | ICD-10-CM | POA: Diagnosis not present

## 2023-11-16 DIAGNOSIS — Z111 Encounter for screening for respiratory tuberculosis: Secondary | ICD-10-CM

## 2023-11-16 MED ORDER — TRIAMCINOLONE ACETONIDE 0.1 % EX CREA
1.0000 | TOPICAL_CREAM | Freq: Two times a day (BID) | CUTANEOUS | 0 refills | Status: DC
Start: 2023-11-16 — End: 2023-11-16

## 2023-11-16 MED ORDER — TRIAMCINOLONE ACETONIDE 0.1 % EX CREA: 1.0000 | TOPICAL_CREAM | Freq: Two times a day (BID) | CUTANEOUS | 0 refills | Status: AC

## 2023-11-16 NOTE — Progress Notes (Signed)
 Subjective:    Patient ID: Madison Bryant, female    DOB: [birth year], 68 y.o.   MRN: [id number]  Hand Pain   Patient has a rash just proximal to her right fourth MCP joint.  There is a patch of erythema that had a small white vesicle.  It itches.  It is rough 6 mm in diameter.  She also has a slightly smaller similar lesion proximal to the right fifth MCP joint.  Both rashes are located on the dorsum of the hand.  She denies any other rash anywhere else on her body.  There is very little pain.  It just itches and burns slightly. 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)   Cellulitis    left foot   Family history of adverse reaction to anesthesia    sister cant use novacaine   GERD (gastroesophageal reflux disease)    OTC med prn   PONV (postoperative nausea and vomiting)    Wears glasses    Past Surgical History:  Procedure Laterality Date   ABDOMINAL HYSTERECTOMY     BREAST LUMPECTOMY WITH RADIOACTIVE SEED LOCALIZATION Right 06/20/2022   Procedure: RIGHT BREAST LUMPECTOMY WITH RADIOACTIVE SEED LOCALIZATION;  Surgeon: Belinda Cough, MD;  Location: [facility];  Service: General;  Laterality: Right;  LMA   COLONOSCOPY  2006   I & D EXTREMITY Left 10/09/2017   Procedure: IRRIGATION AND DEBRIDEMENT LEFT FOOT;  Surgeon: Jerri Kay HERO, MD;  Location: [facility];  Service: Orthopedics;  Laterality: Left;   ORIF FIBULA FRACTURE Left 08/21/2016   Procedure: OPEN REDUCTION INTERNAL FIXATION (ORIF) DISTAL FIBULA FRACTURE;  Surgeon: Toribio JULIANNA Chancy, MD;  Location: [facility];  Service: Orthopedics;  Laterality: Left;   RE-EXCISION OF BREAST CANCER,SUPERIOR MARGINS Right 07/01/2022   Procedure: RE-EXCISION OF SUPERIOR AND LATERAL MARGINS RIGHT BREAST;  Surgeon: Belinda Cough, MD;  Location: [facility];  Service: General;  Laterality: Right;   ROTATOR CUFF REPAIR Right    TONSILLECTOMY     TUBAL LIGATION     Current Outpatient Medications on File Prior to Visit  Medication Sig Dispense Refill   Coenzyme Q10 (COQ10 PO) Take 1 tablet by mouth daily.     Cyanocobalamin (VITAMIN B 12 PO) Take by mouth.     ibuprofen  (ADVIL ) 800 MG tablet Take 1 tablet (800 mg total) by mouth every 8 (eight) hours as needed. 30 tablet 2   Misc Natural Products (NEURIVA PO) Take by mouth.     Multiple Vitamins-Minerals (MULTIVITAMIN ADULTS 50+) TABS Take by mouth.     VITAMIN D, CHOLECALCIFEROL, PO Take by mouth.     XIIDRA 5 % SOLN      acetaminophen  (TYLENOL ) 325 MG tablet Take 650 mg by mouth every 6 (six) hours as needed. (Patient not taking: Reported on 11/16/2023)     HYDROcodone  bit-homatropine (HYCODAN) 5-1.5 MG/5ML syrup Take 5 mLs by mouth every 8 (eight) hours as needed for cough. (Patient not taking: Reported on 11/16/2023) 120 mL 0   letrozole  (FEMARA ) 2.5 MG tablet Take 1 tablet (2.5 mg total) by mouth daily. (Patient not taking: Reported on 11/16/2023) 90 tablet 3   No current facility-administered medications on file prior to visit.   Allergies  Allergen Reactions   Azithromycin     Other Reaction(s): stomach upset   Erythromycin Nausea And Vomiting and Other (See Comments)    GI UPSET   Social History   Socioeconomic History   Marital status: Married    Spouse name: Not on file   Number of children: Not on file   Years of education: Not on file   Highest education level: Not on file  Occupational History   Not on file  Tobacco Use   Smoking status: Never   Smokeless tobacco: Never  Vaping Use   Vaping status: Never Used  Substance and Sexual Activity   Alcohol use: No   Drug use: No   Sexual activity: Yes  Other Topics Concern   Not on file  Social History Narrative   Not on file   Social Drivers of Health   Financial Resource Strain: Low Risk  (06/04/2022)   Overall  Financial Resource Strain (CARDIA)    Difficulty of Paying Living Expenses: Not hard at all  Food Insecurity: No Food Insecurity (06/04/2022)   Hunger Vital Sign    Worried About Running Out of Food in the Last Year: Never true    Ran Out of Food in the Last Year: Never true  Transportation Needs: No Transportation Needs (06/04/2022)   PRAPARE - Administrator, Civil Service (Medical): No    Lack of Transportation (Non-Medical): No  Physical Activity: Not on file  Stress: Not on file  Social Connections: Not on file  Intimate Partner Violence: Not on file      Review of Systems  All other systems reviewed and are negative.      Objective:   Physical Exam Constitutional:      General: She is not in acute distress.    Appearance: Normal appearance. She is not ill-appearing, toxic-appearing or diaphoretic.  HENT:     Head: Normocephalic and atraumatic.  Neck:     Vascular: No carotid bruit.  Cardiovascular:     Rate and Rhythm: Normal rate and regular rhythm.     Pulses: Normal pulses.     Heart sounds: Normal heart sounds. No murmur heard.    No friction rub. No gallop.  Pulmonary:     Effort: Pulmonary effort is normal. No respiratory distress.     Breath sounds: Normal breath sounds. No stridor. No wheezing.  Musculoskeletal:       Hands:     Cervical back: Normal range of motion and neck supple. No rigidity or tenderness.  Lymphadenopathy:     Cervical: No cervical adenopathy.  Skin:    Findings: Erythema and rash present. Rash is macular and vesicular.  Neurological:     General: No focal deficit present.     Mental Status: She is alert and oriented to person, place, and time. Mental status is at baseline.           Assessment & Plan:  Screening-pulmonary TB - Plan: QuantiFERON-TB Gold Plus  Irritant contact dermatitis, unspecified trigger This is most likely a contact dermatitis.  Apply triamcinolone  cream twice daily for 7 days.  Patient also  requires a TB test for form related to her work.

## 2023-11-18 LAB — QUANTIFERON-TB GOLD PLUS
Mitogen-NIL: 3.89 [IU]/mL
NIL: 0.02 [IU]/mL
QuantiFERON-TB Gold Plus: NEGATIVE
TB1-NIL: <0 [IU]/mL
TB2-NIL: <0 [IU]/mL

## 2023-11-19 ENCOUNTER — Ambulatory Visit: Payer: Self-pay | Admitting: Family Medicine

## 2023-11-30 ENCOUNTER — Encounter: Payer: Self-pay | Admitting: Family Medicine

## 2023-12-01 ENCOUNTER — Other Ambulatory Visit: Payer: Self-pay

## 2023-12-01 DIAGNOSIS — L659 Nonscarring hair loss, unspecified: Secondary | ICD-10-CM

## 2023-12-08 ENCOUNTER — Encounter: Payer: Self-pay | Admitting: Family Medicine

## 2023-12-08 ENCOUNTER — Ambulatory Visit: Admitting: Family Medicine

## 2023-12-08 VITALS — BP 132/76 | HR 96 | Temp 98.1°F | Ht 66.0 in | Wt 176.0 lb

## 2023-12-08 DIAGNOSIS — R5383 Other fatigue: Secondary | ICD-10-CM | POA: Diagnosis not present

## 2023-12-08 DIAGNOSIS — R7303 Prediabetes: Secondary | ICD-10-CM

## 2023-12-08 DIAGNOSIS — Z13220 Encounter for screening for lipoid disorders: Secondary | ICD-10-CM | POA: Diagnosis not present

## 2023-12-08 NOTE — Progress Notes (Signed)
 Subjective:    Patient ID: Madison Bryant, female    DOB: [birth year], 68 y.o.   MRN: [id number]  HPI  Patient has a history of borderline diabetes.  Her hemoglobin A1c was 6.4 in February.  She has been working on diet exercise and weight loss however she has plateaued with weight loss and she is unable to achieve any significant weight loss despite her efforts.  She is restricting her carbohydrates and trying to eat a low-fat diet.  She continues to report fatigue.  She is also having hair loss in an androgenic pattern.  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)   Cellulitis    left foot   Family history of adverse reaction to anesthesia    sister cant use novacaine   GERD (gastroesophageal reflux disease)    OTC med prn   PONV (postoperative nausea and vomiting)    Wears glasses    Past Surgical History:  Procedure Laterality Date   ABDOMINAL HYSTERECTOMY     BREAST LUMPECTOMY WITH RADIOACTIVE SEED LOCALIZATION Right 06/20/2022   Procedure: RIGHT BREAST LUMPECTOMY WITH RADIOACTIVE SEED LOCALIZATION;  Surgeon: Belinda Cough, MD;  Location: [facility];  Service: General;  Laterality: Right;  LMA   COLONOSCOPY  2006   I & D EXTREMITY Left 10/09/2017   Procedure: IRRIGATION AND DEBRIDEMENT LEFT FOOT;  Surgeon: Jerri Kay HERO, MD;  Location: [facility];  Service: Orthopedics;  Laterality: Left;   ORIF FIBULA FRACTURE Left 08/21/2016   Procedure: OPEN REDUCTION INTERNAL FIXATION (ORIF) DISTAL FIBULA FRACTURE;  Surgeon: Toribio JULIANNA Chancy, MD;  Location: [facility];  Service: Orthopedics;  Laterality: Left;   RE-EXCISION OF BREAST CANCER,SUPERIOR MARGINS Right 07/01/2022   Procedure: RE-EXCISION OF SUPERIOR AND LATERAL MARGINS RIGHT BREAST;  Surgeon: Belinda Cough, MD;  Location: [facility];   Service: General;  Laterality: Right;   ROTATOR CUFF REPAIR Right    TONSILLECTOMY     TUBAL LIGATION     Current Outpatient Medications on File Prior to Visit  Medication Sig Dispense Refill   acetaminophen  (TYLENOL ) 325 MG tablet Take 650 mg by mouth every 6 (six) hours as needed. (Patient not taking: Reported on 11/16/2023)     Coenzyme Q10 (COQ10 PO) Take 1 tablet by mouth daily.     Cyanocobalamin (VITAMIN B 12 PO) Take by mouth.     HYDROcodone  bit-homatropine (HYCODAN) 5-1.5 MG/5ML syrup Take 5 mLs by mouth every 8 (eight) hours as needed for cough. (Patient not taking: Reported on 11/16/2023) 120 mL 0   ibuprofen  (ADVIL ) 800 MG tablet Take 1 tablet (800 mg total) by mouth every 8 (eight) hours as needed. 30 tablet 2   letrozole  (FEMARA ) 2.5 MG tablet Take 1 tablet (2.5 mg total) by mouth daily. (Patient not taking: Reported on 11/16/2023) 90 tablet 3   Misc Natural Products (NEURIVA PO) Take by mouth.     Multiple Vitamins-Minerals (MULTIVITAMIN ADULTS 50+) TABS Take by mouth.     triamcinolone  cream (KENALOG ) 0.1 % Apply 1 Application topically 2 (two) times daily. 30 g 0   VITAMIN D, CHOLECALCIFEROL, PO Take by mouth.     XIIDRA 5 % SOLN      No current facility-administered medications on file prior  to visit.   Allergies  Allergen Reactions   Azithromycin     Other Reaction(s): stomach upset   Erythromycin Nausea And Vomiting and Other (See Comments)    GI UPSET   Social History   Socioeconomic History   Marital status: Married    Spouse name: Not on file   Number of children: Not on file   Years of education: Not on file   Highest education level: Not on file  Occupational History   Not on file  Tobacco Use   Smoking status: Never   Smokeless tobacco: Never  Vaping Use   Vaping status: Never Used  Substance and Sexual Activity   Alcohol use: No   Drug use: No   Sexual activity: Yes  Other Topics Concern   Not on file  Social History Narrative   Not on file    Social Drivers of Health   Financial Resource Strain: Low Risk  (06/04/2022)   Overall Financial Resource Strain (CARDIA)    Difficulty of Paying Living Expenses: Not hard at all  Food Insecurity: No Food Insecurity (06/04/2022)   Hunger Vital Sign    Worried About Running Out of Food in the Last Year: Never true    Ran Out of Food in the Last Year: Never true  Transportation Needs: No Transportation Needs (06/04/2022)   PRAPARE - Administrator, Civil Service (Medical): No    Lack of Transportation (Non-Medical): No  Physical Activity: Not on file  Stress: Not on file  Social Connections: Not on file  Intimate Partner Violence: Not on file      Review of Systems  All other systems reviewed and are negative.      Objective:   Physical Exam Constitutional:      General: She is not in acute distress.    Appearance: Normal appearance. She is not ill-appearing, toxic-appearing or diaphoretic.  HENT:     Head: Normocephalic and atraumatic.     Right Ear: Tympanic membrane and ear canal normal.     Left Ear: Tympanic membrane and ear canal normal.     Nose: Nose normal. No congestion or rhinorrhea.     Mouth/Throat:     Mouth: Mucous membranes are moist.     Pharynx: Oropharynx is clear. No oropharyngeal exudate or posterior oropharyngeal erythema.  Eyes:     Extraocular Movements: Extraocular movements intact.     Conjunctiva/sclera: Conjunctivae normal.     Pupils: Pupils are equal, round, and reactive to light.  Neck:     Vascular: No carotid bruit.  Cardiovascular:     Rate and Rhythm: Normal rate and regular rhythm.     Pulses: Normal pulses.     Heart sounds: Normal heart sounds. No murmur heard.    No friction rub. No gallop.  Pulmonary:     Effort: Pulmonary effort is normal. No respiratory distress.     Breath sounds: Normal breath sounds. No stridor. No wheezing.  Abdominal:     General: Abdomen is flat. Bowel sounds are normal. There is no  distension.     Palpations: Abdomen is soft.     Tenderness: There is no abdominal tenderness. There is no guarding or rebound.  Musculoskeletal:        General: No swelling, tenderness, deformity or signs of injury.     Cervical back: Normal range of motion and neck supple. No rigidity or tenderness.     Right lower leg: No edema.  Left lower leg: No edema.  Lymphadenopathy:     Cervical: No cervical adenopathy.  Skin:    Findings: No erythema or rash.  Neurological:     General: No focal deficit present.     Mental Status: She is alert and oriented to person, place, and time. Mental status is at baseline.     Motor: No weakness.     Gait: Gait normal.     Deep Tendon Reflexes: Reflexes normal.  Psychiatric:        Mood and Affect: Mood normal.        Behavior: Behavior normal.        Thought Content: Thought content normal.        Judgment: Judgment normal.           Assessment & Plan:  Prediabetes - Plan: Hemoglobin A1c, CBC with Differential/Platelet, Comprehensive metabolic panel with GFR, Lipid panel  Fatigue, unspecified type - Plan: Vitamin B12, TSH Given her fatigue I will check a TSH along with a B12 level.  I would really recommend the iron panel.  If significantly elevated I would recommend adding Ozempic to facilitate weight loss.  Help with insulin resistance, and manage her blood sugar

## 2023-12-09 ENCOUNTER — Ambulatory Visit

## 2023-12-09 LAB — CBC WITH DIFFERENTIAL/PLATELET
Absolute Lymphocytes: 1169 {cells}/uL (ref 850–3900)
Absolute Monocytes: 431 {cells}/uL (ref 200–950)
Basophils Absolute: 41 {cells}/uL (ref 0–200)
Basophils Relative: 1 %
Eosinophils Absolute: 139 {cells}/uL (ref 15–500)
Eosinophils Relative: 3.4 %
HCT: 37.3 % (ref 35.0–45.0)
Hemoglobin: 12.4 g/dL (ref 11.7–15.5)
MCH: 30.1 pg (ref 27.0–33.0)
MCHC: 33.2 g/dL (ref 32.0–36.0)
MCV: 90.5 fL (ref 80.0–100.0)
MPV: 9 fL (ref 7.5–12.5)
Monocytes Relative: 10.5 %
Neutro Abs: 2321 {cells}/uL (ref 1500–7800)
Neutrophils Relative %: 56.6 %
Platelets: 280 Thousand/uL (ref 140–400)
RBC: 4.12 Million/uL (ref 3.80–5.10)
RDW: 11.8 % (ref 11.0–15.0)
Total Lymphocyte: 28.5 %
WBC: 4.1 Thousand/uL (ref 3.8–10.8)

## 2023-12-09 LAB — LIPID PANEL
Cholesterol: 214 mg/dL — ABNORMAL HIGH (ref <200)
HDL: 53 mg/dL (ref >=50)
LDL Cholesterol (Calc): 126 mg/dL — ABNORMAL HIGH
Non-HDL Cholesterol (Calc): 161 mg/dL — ABNORMAL HIGH (ref <130)
Total CHOL/HDL Ratio: 4 (calc) (ref <5.0)
Triglycerides: 201 mg/dL — ABNORMAL HIGH (ref <150)

## 2023-12-09 LAB — COMPREHENSIVE METABOLIC PANEL WITH GFR
AG Ratio: 1.9 (calc) (ref 1.0–2.5)
ALT: 20 U/L (ref 6–29)
AST: 15 U/L (ref 10–35)
Albumin: 4.4 g/dL (ref 3.6–5.1)
Alkaline phosphatase (APISO): 102 U/L (ref 37–153)
BUN: 11 mg/dL (ref 7–25)
CO2: 29 mmol/L (ref 20–32)
Calcium: 9.5 mg/dL (ref 8.6–10.4)
Chloride: 104 mmol/L (ref 98–110)
Creat: 0.81 mg/dL (ref 0.50–1.05)
Globulin: 2.3 g/dL (ref 1.9–3.7)
Glucose, Bld: 111 mg/dL — ABNORMAL HIGH (ref 65–99)
Potassium: 4.4 mmol/L (ref 3.5–5.3)
Sodium: 140 mmol/L (ref 135–146)
Total Bilirubin: 0.6 mg/dL (ref 0.2–1.2)
Total Protein: 6.7 g/dL (ref 6.1–8.1)
eGFR: 80 mL/min/1.73m2 (ref >=60)

## 2023-12-09 LAB — HEMOGLOBIN A1C
Hgb A1c MFr Bld: 6.4 % — ABNORMAL HIGH (ref <5.7)
Mean Plasma Glucose: 137 mg/dL
eAG (mmol/L): 7.6 mmol/L

## 2023-12-09 LAB — VITAMIN B12: Vitamin B-12: >2000 pg/mL — ABNORMAL HIGH (ref 200–1100)

## 2023-12-09 LAB — TSH: TSH: 1.78 m[IU]/L (ref 0.40–4.50)

## 2023-12-10 ENCOUNTER — Ambulatory Visit: Payer: Self-pay | Admitting: Family Medicine

## 2023-12-10 ENCOUNTER — Other Ambulatory Visit: Payer: Self-pay

## 2023-12-10 DIAGNOSIS — M65331 Trigger finger, right middle finger: Secondary | ICD-10-CM | POA: Diagnosis not present

## 2023-12-10 DIAGNOSIS — E782 Mixed hyperlipidemia: Secondary | ICD-10-CM

## 2023-12-10 DIAGNOSIS — R7303 Prediabetes: Secondary | ICD-10-CM

## 2023-12-10 MED ORDER — ROSUVASTATIN CALCIUM 10 MG PO TABS: 10.0000 mg | ORAL_TABLET | Freq: Every day | ORAL | 3 refills | Status: DC

## 2023-12-16 ENCOUNTER — Ambulatory Visit: Admitting: Dermatology

## 2023-12-16 ENCOUNTER — Encounter: Payer: Self-pay | Admitting: Dermatology

## 2023-12-16 VITALS — BP 141/71

## 2023-12-16 DIAGNOSIS — L649 Androgenic alopecia, unspecified: Secondary | ICD-10-CM | POA: Diagnosis not present

## 2023-12-16 DIAGNOSIS — L650 Telogen effluvium: Secondary | ICD-10-CM

## 2023-12-16 MED ORDER — SAFETY SEAL MISCELLANEOUS MISC: 0 refills | Status: AC

## 2023-12-16 NOTE — Patient Instructions (Addendum)
 Date: Wed Dec 16 2023  Hello Madison Bryant,  Thank you for visiting today. 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   - Medications:   - Apply topical minoxidil 8% to scalp every morning   - Put a few drops on top of scalp and rub in well   - Put a few drops on sides of scalp and rub in well   - Use a thin layer and massage into scalp  - Supplements:   - Continue taking your regular multivitamins   - Add daily collagen supplement (Vital Proteins brand recommended)  - Hair Care:   - It's okay to color your hair   - Skip minoxidil application on the day you color your hair and the day after  - Follow-up:   - Return for follow-up appointment in 4 months  - Other Instructions:   - Minoxidil may cause increased hair shedding at first. This is normal. Don't stop using it.   - It takes about 4 months to start seeing new hair growth   - Use minoxidil every day for best results   - If you forget to use it for a week, it's okay. But don't stop for 3 months or more.  Please reach out if you have any questions or concerns.  Warm regards,  Dr. [facility]        Important Information  Due to recent changes in healthcare laws, you may see results of your pathology and/or laboratory studies on MyChart before the doctors have had a chance to review them. We understand that in some cases there may be results that are confusing or concerning to you. Please understand that not all results are received at the same time and often the doctors may need to interpret multiple results in order to provide you with the best plan of care or course of treatment. Therefore, we ask that you please give us  2 business days to thoroughly review all your results before contacting the office for clarification. Should we see a critical lab result, you will be contacted  sooner.   If You Need Anything After Your Visit  If you have any questions or concerns for your doctor, please call our main line at [phone] If no one answers, please leave a voicemail as directed and we will return your call as soon as possible. Messages left after 4 pm will be answered the following business day.   You may also send us  a message via MyChart. We typically respond to MyChart messages within 1-2 business days.  For prescription refills, please ask your pharmacy to contact our office. Our fax number is [phone].  If you have an urgent issue when the clinic is closed that cannot wait until the next business day, you can page your doctor at the number below.    Please note that while we do our best to be available for urgent issues outside of office hours, we are not available 24/7.   If you have an urgent issue and are unable to reach us , you may choose to seek medical care at your doctor's office, retail clinic, urgent care center, or emergency room.  If you have a medical emergency, please immediately call 911 or go to the emergency department. In the event of inclement weather, please call our main line at [phone] for an update on the status of any delays or  closures.  Dermatology Medication Tips: Please keep the boxes that topical medications come in in order to help keep track of the instructions about where and how to use these. Pharmacies typically print the medication instructions only on the boxes and not directly on the medication tubes.   If your medication is too expensive, please contact our office at [phone] or send us  a message through MyChart.   We are unable to tell what your co-pay for medications will be in advance as this is different depending on your insurance coverage. However, we may be able to find a substitute medication at lower cost or fill out paperwork to get insurance to cover a needed medication.   If a prior authorization is  required to get your medication covered by your insurance company, please allow us  1-2 business days to complete this process.  Drug prices often vary depending on where the prescription is filled and some pharmacies may offer cheaper prices.  The website www.goodrx.com contains coupons for medications through different pharmacies. The prices here do not account for what the cost may be with help from insurance (it may be cheaper with your insurance), but the website can give you the price if you did not use any insurance.  - You can print the associated coupon and take it with your prescription to the pharmacy.  - You may also stop by our office during regular business hours and pick up a GoodRx coupon card.  - If you need your prescription sent electronically to a different pharmacy, notify our office through [facility] or by phone at [phone]

## 2023-12-16 NOTE — Progress Notes (Signed)
   New Patient Visit   Subjective  Madison Bryant is a 67 y.o. female who presents for a NEW PATIENT appointment to be examined for the concerns as listed below.   Hair Loss: Located at the scalp that presented 1 year ago after starting Letrozole  post breast cancer surgery Tx in July 2024 .(D/C taking in June 2025 due to personal preference). The area is itchy - rating it 5 out of 10. She has previously been treated for these areas. She has not tried anything OTC.   Patient report Hx of Bx - benign. Patient  family Hx of skin cancer.   The following portions of the chart were reviewed this encounter and updated as appropriate: medications, allergies, medical history  Review of Systems:  No other skin or systemic complaints except as noted in HPI or Assessment and Plan.  Objective  Well appearing patient in no apparent distress; mood and affect are within normal limits.   A focused examination was performed of the following areas: scalp   Relevant exam findings are noted in the Assessment and Plan.             Assessment & Plan   ANDROGENETIC ALOPECIA w/ TELOGEN EFFLUVIUM  Exam: negative hair pull test, Widening part and thinning of frontal scalp, B/L temples, and a little bit on the parietal scalp  flared  - Assessment: Patient presents with hair thinning that began approximately one year ago following surgery. The condition has worsened over the past 6 months. Patient has a history of breast cancer and was on anastrozole  for 5 years, which was changed to letrozole  in July 2024. Both medications caused fatigue and joint pain, leading to discontinuation. Physical examination reveals widening part and thinning of the frontal scalp, bilateral temples, parietal scalp, and vertex. Negative hair pull test. The diagnosis is androgenetic alopecia, exacerbated by telogen effluvium. The androgenetic alopecia is likely due to postmenopausal hormonal changes and genetics, while the  telogen effluvium is attributed to recent surgeries, anesthesia, and medication changes.  - Plan:    Start topical minoxidil 8% solution, applied once daily in the morning    Apply a thin layer to affected areas of the scalp and massage into scalp    Informed patient that initial increased shedding may occur but to continue use    Advised that results may take about 4 months to become visible    Add daily collagen supplement    Continue current multivitamin regimen    Advised patient can continue hair coloring, but to skip minoxidil application on the day of coloring and the day after    Consider adding finasteride at follow-up, pending confirmation of safety with breast cancer history    Informed patient that ongoing treatment is necessary for continued efficacy  Follow-up in 4 months to assess response to treatment. Long term medication management.  Patient is using long term (months to years) prescription medication  to control their dermatologic condition.  These medications require periodic monitoring to evaluate for efficacy and side effects and may require periodic laboratory monitoring.   ANDROGENETIC ALOPECIA    Return in about 4 months (around 04/16/2024) for androgenetic alopecia.   Documentation: I have reviewed the above documentation for accuracy and completeness, and I agree with the above.  I, Shirron Maranda, CMA, am acting as scribe for [employer], DO.   Delon Lenis, DO

## 2024-01-06 ENCOUNTER — Ambulatory Visit

## 2024-01-13 ENCOUNTER — Encounter: Payer: Self-pay | Admitting: Family Medicine

## 2024-01-25 ENCOUNTER — Inpatient Hospital Stay: Attending: Hematology and Oncology | Admitting: Hematology and Oncology

## 2024-01-25 ENCOUNTER — Other Ambulatory Visit: Payer: Self-pay | Admitting: *Deleted

## 2024-01-25 VITALS — BP 122/82 | HR 69 | Temp 98.5°F | Resp 16 | Wt 176.7 lb

## 2024-01-25 DIAGNOSIS — Z17411 Hormone receptor positive with human epidermal growth factor receptor 2 negative status: Secondary | ICD-10-CM | POA: Diagnosis not present

## 2024-01-25 DIAGNOSIS — D0511 Intraductal carcinoma in situ of right breast: Secondary | ICD-10-CM

## 2024-01-25 DIAGNOSIS — Z170 Estrogen receptor positive status [ER+]: Secondary | ICD-10-CM | POA: Diagnosis not present

## 2024-01-25 DIAGNOSIS — Z79811 Long term (current) use of aromatase inhibitors: Secondary | ICD-10-CM | POA: Insufficient documentation

## 2024-01-25 DIAGNOSIS — C50811 Malignant neoplasm of overlapping sites of right female breast: Secondary | ICD-10-CM | POA: Diagnosis not present

## 2024-01-25 MED ORDER — LETROZOLE 2.5 MG PO TABS: 2.5000 mg | ORAL_TABLET | Freq: Every day | ORAL | 3 refills | Status: DC

## 2024-01-25 MED ORDER — LETROZOLE 2.5 MG PO TABS: 2.5000 mg | ORAL_TABLET | Freq: Every day | ORAL | 3 refills | Status: AC

## 2024-01-25 NOTE — Assessment & Plan Note (Addendum)
 Assessment and Plan Assessment & Plan 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 Fatigue and sleeplessness may be multifactorial, with letrozole  as a potential contributor. - Refill letrozole  prescription at [facility] on [address] - Continue minoxidil for alopecia. - Encourage stress management and adequate sleep. - Encourage weight loss to alleviate joint pain. - Monitor side effects and adjust treatment if necessary. - Report if fatigue and sleeplessness persist after stressors decrease.  Prediabetes Prediabetes managed with diet and exercise. Weight reduced from 186 to 170 pounds. - Continue 45 grams of carbohydrates and 1500 calories per day diet. - Continue using stationary bike and walking for exercise.

## 2024-01-25 NOTE — Progress Notes (Signed)
  Cancer Center Cancer Follow up:    Madison Bryant DASEN, MD [address] 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  Additional views with possible ultrasound are recommended.  Diagnostic mammogram and ultrasound followed.  Ultrasound showed 1.3 x 1.6 x 1.5 cm irregular mass in the right breast suspicious for malignancy.   05/23/2022 Pathology Results   Pathology from the right breast needle core biopsy at 12:00 2 cm from the nipple shows intermediate grade DCIS, cribriform type, necrosis and calcs present.  Prognostic showed ER 100% positive strong staining PR 100% positive strong staining   06/20/2022 Surgery   Right lumpectomy: IDC, 0.2cm, grade 1, ER+, PR+, HER2 negative, Ki67-5%   07/01/2022 Surgery   Re-excision cleared margins    08/12/2022 - 09/08/2022 Radiation Therapy   Plan Name: Breast_R Site: Breast, Right Technique: 3D Mode: Photon Dose Per Fraction: 2.66 Gy Prescribed Dose (Delivered / Prescribed): 42.56 Gy / 42.56 Gy Prescribed Fxs (Delivered / Prescribed): 16 / 16   Plan Name: Breast_R_Bst Site: Breast, Right Technique: 3D Mode: Photon Dose Per Fraction: 2.5 Gy Prescribed Dose (Delivered / Prescribed): 10 Gy / 10 Gy Prescribed Fxs (Delivered / Prescribed): 4 / 4   09/2022 -  Anti-estrogen oral therapy   Anastrozole  daily   12/15/2022 Cancer Staging   Staging form: Breast, AJCC 8th Edition - Pathologic: Stage IA (pT1a, pN0, cM0, G1, ER+,  PR+, HER2-) - Signed by Crawford Morna Pickle, NP on 12/15/2022 Histologic grading system: 3 grade system   Ductal carcinoma in situ (DCIS) of right breast  06/02/2022 Initial Diagnosis   Ductal carcinoma in situ (DCIS) of right breast     CURRENT THERAPY: letrozole   INTERVAL HISTORY:  Discussed the use of AI scribe software for clinical note transcription with the patient, who gave verbal consent to proceed.  History of Present Illness Madison Bryant is a 68 year old female with history of breast cancer and prediabetes who presents for follow-up on her condition and medication management.  She was diagnosed with prediabetes and has been adhering to a diet of 45 grams of carbohydrates and 1500 calories per day. She has successfully lost about eight pounds since starting this regimen and uses a stationary bike for exercise.  She is currently taking letrozole  for breast cancer and experiences side effects including hair loss, joint pain, fatigue, low energy, and sleeplessness. She has started using minoxidil and collagen to address hair loss but has not yet observed new hair growth. Her last mammogram was in January at [facility], and she recalls being told that all was well.  Her husband is scheduled for surgery to remove a mass in his testicular sac, which has grown significantly over 15 years. She is concerned about his pain management post-surgery as he is reluctant to take pain medication.  Her sister is facing a hip replacement surgery and has agreed to move into a senior living facility. She is her sister's primary caregiver and is concerned about her sister's cognitive decline, which  includes short-term memory issues. Her sister has a history of back surgery in 2019 and has been diagnosed with antiphospholipid antibody syndrome, affecting her INR levels.    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 (HCC) 06/02/2022   Ductal carcinoma in situ (DCIS) of right breast 06/02/2022   Seasonal allergic rhinitis 11/05/2020   Anxiety 10/17/2020   Hallux rigidus of left foot 10/19/2019   Ganglion of flexor tendon sheath of right middle finger 03/03/2019   Radial styloid tenosynovitis of right hand 03/03/2019   GERD (gastroesophageal reflux disease) 01/26/2019   Osteoarthritis 02/08/2018   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  MEDICAL HISTORY: 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)   Cellulitis    left foot   Family history of adverse reaction to anesthesia    sister cant use novacaine   GERD (gastroesophageal reflux disease)    OTC med prn   PONV (postoperative nausea and vomiting)    Wears glasses     SURGICAL HISTORY: Past Surgical History:  Procedure Laterality Date   ABDOMINAL HYSTERECTOMY     BREAST LUMPECTOMY WITH RADIOACTIVE SEED LOCALIZATION Right 06/20/2022   Procedure: RIGHT BREAST LUMPECTOMY WITH RADIOACTIVE SEED LOCALIZATION;  Surgeon: Belinda Cough, MD;  Location: [facility];  Service: General;  Laterality: Right;  LMA   COLONOSCOPY  2006   I & D EXTREMITY Left 10/09/2017   Procedure: IRRIGATION AND DEBRIDEMENT LEFT FOOT;  Surgeon: Jerri Kay HERO, MD;  Location: [facility];  Service: Orthopedics;  Laterality: Left;   ORIF FIBULA FRACTURE Left 08/21/2016   Procedure:  OPEN REDUCTION INTERNAL FIXATION (ORIF) DISTAL FIBULA FRACTURE;  Surgeon: Toribio JULIANNA Chancy, MD;  Location: [facility];  Service: Orthopedics;  Laterality: Left;   RE-EXCISION OF BREAST CANCER,SUPERIOR MARGINS Right 07/01/2022   Procedure: RE-EXCISION OF SUPERIOR AND LATERAL MARGINS RIGHT BREAST;  Surgeon: Belinda Cough, MD;  Location: [facility];  Service: General;  Laterality: Right;   ROTATOR CUFF REPAIR Right    TONSILLECTOMY     TUBAL LIGATION      SOCIAL HISTORY: Social History   Socioeconomic History   Marital status: Married    Spouse name: Not on file   Number of children: Not on file   Years of education: Not on file   Highest education level: Not on file  Occupational History   Not on file  Tobacco Use   Smoking status: Never   Smokeless tobacco: Never  Vaping Use   Vaping status: Never Used  Substance and Sexual Activity   Alcohol use: No   Drug use: No   Sexual activity: Yes  Other Topics Concern   Not on file  Social History Narrative   Not  on file   Social Drivers of Health   Financial Resource Strain: Low Risk  (06/04/2022)   Overall Financial Resource Strain (CARDIA)    Difficulty of Paying Living Expenses: Not hard at all  Food Insecurity: No Food Insecurity (06/04/2022)   Hunger Vital Sign    Worried About Running Out of Food in the Last Year: Never true    Ran Out of Food in the Last Year: Never true  Transportation Needs: No Transportation Needs (06/04/2022)   PRAPARE - Administrator, Civil Service (Medical): No    Lack of Transportation (Non-Medical): No  Physical Activity: Not on file  Stress: Not on file  Social Connections: Not on file  Intimate Partner Violence: Not on file    FAMILY HISTORY: Family History  Problem Relation Age of Onset   Miscarriages / Stillbirths Mother    Colon cancer Paternal Aunt 11 - 51    Review of Systems  Constitutional:  Negative for appetite change, chills, fatigue,  fever and unexpected weight change.  HENT:   Negative for hearing loss, lump/mass and trouble swallowing.   Eyes:  Negative for eye problems and icterus.  Respiratory:  Negative for chest tightness, cough and shortness of breath.   Cardiovascular:  Negative for chest pain, leg swelling and palpitations.  Gastrointestinal:  Negative for abdominal distention, abdominal pain, constipation, diarrhea, nausea and vomiting.  Endocrine: Negative for hot flashes.  Genitourinary:  Negative for difficulty urinating.   Musculoskeletal:  Negative for arthralgias.  Skin:  Negative for itching and rash.  Neurological:  Negative for dizziness, extremity weakness, headaches and numbness.  Hematological:  Negative for adenopathy. Does not bruise/bleed easily.  Psychiatric/Behavioral:  Negative for depression. The patient is not nervous/anxious.       PHYSICAL EXAMINATION   Onc Performance Status - 01/25/24 1200       KPS SCALE   KPS % SCORE Able to carry on normal activity, minor s/s of disease          Vitals:   01/25/24 1223  BP: 122/82  Pulse: 69  Resp: 16  Temp: 98.5 F (36.9 C)  SpO2: 98%   She appears well. No acute distress No cervical or axillary adenopathy CTA bilaterally RRR No LE edema.  LABORATORY DATA:  CBC    Component Value Date/Time   WBC 4.1 12/08/2023 1111   RBC 4.12 12/08/2023 1111   HGB 12.4 12/08/2023 1111   HGB 11.7 (L) 06/24/2023 0921   HGB 12.1 06/08/2006 1608   HCT 37.3 12/08/2023 1111   HCT 34.3 (L) 06/08/2006 1608   PLT 280 12/08/2023 1111   PLT 235 06/24/2023 0921   PLT 339 06/08/2006 1608   MCV 90.5 12/08/2023 1111   MCV 88.4 06/08/2006 1608   MCH 30.1 12/08/2023 1111   MCHC 33.2 12/08/2023 1111   RDW 11.8 12/08/2023 1111   RDW 13.1 06/08/2006 1608   LYMPHSABS 0.9 06/24/2023 0921   LYMPHSABS 1.4 06/08/2006 1608   MONOABS 0.5 06/24/2023 0921   MONOABS 0.5 06/08/2006 1608   EOSABS 139 12/08/2023 1111   EOSABS 0.1 06/08/2006 1608    BASOSABS 41 12/08/2023 1111   BASOSABS 0.1 06/08/2006 1608    CMP     Component Value Date/Time   NA 140 12/08/2023 1111   K 4.4 12/08/2023 1111   CL 104 12/08/2023 1111   CO2 29 12/08/2023 1111   GLUCOSE 111 (H) 12/08/2023 1111   BUN 11 12/08/2023 1111   CREATININE 0.81 12/08/2023 1111  CALCIUM  9.5 12/08/2023 1111   PROT 6.7 12/08/2023 1111   ALBUMIN 4.1 06/24/2023 0921   AST 15 12/08/2023 1111   AST 17 06/24/2023 0921   ALT 20 12/08/2023 1111   ALT 19 06/24/2023 0921   ALKPHOS 82 06/24/2023 0921   BILITOT 0.6 12/08/2023 1111   BILITOT 0.5 06/24/2023 0921   GFRNONAA >60 06/24/2023 0921   GFRNONAA 83 12/02/2017 1626   GFRAA 96 12/02/2017 1626      ASSESSMENT and THERAPY PLAN:   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 Fatigue and sleeplessness may be multifactorial, with letrozole  as a potential contributor. - Refill letrozole  prescription at [facility] on [address] - Continue minoxidil for alopecia. - Encourage stress management and adequate sleep. - Encourage weight loss to alleviate joint pain. - Monitor side effects and adjust treatment if necessary. - Report if fatigue and sleeplessness persist after stressors decrease.  Prediabetes Prediabetes managed with diet and exercise. Weight reduced from 186 to 170 pounds. - Continue 45 grams of carbohydrates and 1500 calories per day diet. - Continue using stationary bike and walking for exercise.       All questions were answered. The patient knows to call the clinic with any problems, questions or concerns. We can certainly see the patient much sooner if necessary.  Total encounter time:30 minutes*in face-to-face visit time, chart review, lab review, care coordination, order entry, and documentation of the encounter time.   *Total Encounter  Time as defined by the Centers for Medicare and Medicaid Services includes, in addition to the face-to-face time of a patient visit (documented in the note above) non-face-to-face time: obtaining and reviewing outside history, ordering and reviewing medications, tests or procedures, care coordination (communications with other health care professionals or caregivers) and documentation in the medical record.

## 2024-01-26 ENCOUNTER — Telehealth: Payer: Self-pay | Admitting: Licensed Clinical Social Worker

## 2024-01-26 NOTE — Telephone Encounter (Signed)
 [facility] Clinical Social Work  Clinical Social Work was referred by medical provider for emotional support.  Clinical Social Worker contacted patient by phone to offer support and assess for needs.    Pt was unable to speak at this time as she was brining her sister to an appt. Requested a call back early Thursday afternoon.      Follow Up Plan:  CSW or CSW intern will call pt on Thursday to assess needs    Deniah Saia E Tobe Kervin, LCSW  Clinical Social Worker [facility]

## 2024-01-28 ENCOUNTER — Inpatient Hospital Stay

## 2024-01-28 NOTE — Progress Notes (Signed)
 [facility] Clinical Social Work  Clinical Social Work was referred by medical provider for emotional support.  Clinical Social Work Intern contacted patient by phone to offer support and assess for needs, per pt request on 9/16 for call back at this time. Pt was unable to speak at this time and asked for a call on Friday.    Follow Up Plan:  CSW Intern will call pt on Friday 9/19 to assess needs.    Thersia KATHEE Daring  Clinical Social Work Intern [facility]

## 2024-01-29 ENCOUNTER — Inpatient Hospital Stay

## 2024-01-29 NOTE — Progress Notes (Signed)
 [facility] Clinical Social Work  Initial Assessment   Madison Bryant is a 68 y.o. year old female contacted by phone. Clinical Social Work was referred by medical provider for emotional support.   SDOH (Social Determinants of Health) assessments performed: No SDOH Interventions    Flowsheet Row Office Visit from 01/25/2024 in [facility] - A Dept Of [place]. [facility] Most recent reading at 01/25/2024 12:00 PM Clinical Support from 06/04/2022 in [facility] - A Dept Of [place]. [facility] Most recent reading at 06/04/2022 11:36 AM Office Visit from 06/04/2022 in [facility] - A Dept Of [place]. [facility] Most recent reading at 06/04/2022  8:59 AM Office Visit from 12/02/2017 in [facility] Most recent reading at 12/02/2017  3:35 PM  SDOH Interventions      Food Insecurity Interventions -- Intervention Not Indicated -- --  Housing Interventions -- -- Intervention Not Indicated --  Transportation Interventions -- Intervention Not Indicated -- --  Utilities Interventions -- Intervention Not Indicated -- --  Alcohol Usage Interventions -- -- Intervention Not Indicated (Score <7) --  Depression Interventions/Treatment  Horticulturist, commercial consult placed] -- -- Patient refuses Treatment  Financial Strain Interventions -- Intervention Not Indicated -- --    SDOH Screenings   Food Insecurity: No Food Insecurity (06/04/2022)  Housing: Low Risk  (06/04/2022)  Transportation Needs: No Transportation Needs (06/04/2022)  Utilities: Not At Risk (06/04/2022)  Alcohol Screen: Low Risk  (06/04/2022)  Depression (PHQ2-9): Medium Risk (01/25/2024)  Financial Resource Strain: Low Risk  (06/04/2022)  Tobacco Use: Low Risk  (12/16/2023)    PHQ 2/9:    01/25/2024   12:00 PM 04/14/2022    8:05 AM 10/03/2021   12:14 PM  Depression screen PHQ 2/9  Decreased Interest 3 2 0  Down, Depressed,  Hopeless 2 2 0  PHQ - 2 Score 5 4 0  Altered sleeping 1 2   Tired, decreased energy 1 2   Change in appetite 1 2   Feeling bad or failure about yourself  0 1   Trouble concentrating 1 1   Moving slowly or fidgety/restless 0 0   Suicidal thoughts 0 0   PHQ-9 Score 9 12   Difficult doing work/chores  Very difficult      Distress Screen completed: No    07/29/2022    9:07 AM  ONCBCN DISTRESS SCREENING  How much distress have you been experiencing in the past week? (0-10) 5   Emotional concerns type Adjusting to illness;Nervousness/Anxiety      Data saved with a previous flowsheet row definition      Family/Social Information:  Housing Arrangement: patient lives with husband. Adult son, daughter in law, and 2 grandchildren live 2 miles away and she sees them regularly.  Family members/support persons in your life? 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  Income source: Actor concerns: No Type of concern: None Food access concerns: no Religious or spiritual practice: Gioia Handler faith Advanced directives: No Services Currently in place:  [employer]  Coping/ Adjustment to diagnosis: Patient understands treatment plan and what happens next? yes Concerns about diagnosis and/or treatment: Pt is taking letrozole  and concerned about unexpected side effect of hair loss, as well as  fatigue and joint pain.  Patient reported stressors: Pt is caregiver for her sister who is 35 yrs old and has multiple health challenges, including dementia and hip replacement scheduled for 10/28- sister is on a wait list for senior living community. Pt currently caring for husband after recent surgery. Hopes and/or priorities: Pt would like to balance caregiving roles with taking time to attend to her own physical and  emotional wellbeing, including daily walks, recommitting to creative activities, and starting counseling. Patient enjoys Spending time with son and attending 14 yr old granddaughter's softball games and 6 yr old grandson's T-ball games; walking and going out to eat with her husband; spending time outside; reading the bible and other spiritual texts; crafts such as making seasonal wreaths, although she has not done this in a while.   Current coping skills/ strengths: Ability for insight , Motivation for treatment/growth , Religious Affiliation , and Supportive family/friends     SUMMARY:   Clinical Social Work Clinical Goal(s):  Patient will work with SW to address concerns related to self-care and strained relationship with estranged older son.  Interventions: Discussed common feeling and emotions when being diagnosed with cancer, and the importance of support during treatment Informed patient of the support team roles and support services at [facility] Provided CSW contact information and encouraged patient to call with any questions or concerns Sent pt [facility] and [facility] program calendars; Scheduled new counseling intake appt with CSW Intern.   Follow Up Plan: CSW will see patient on 02/10/24 for counseling. Pt will contact CSW Intern with any needs in the meantime. Patient verbalizes understanding of plan: Yes    Thersia KATHEE Daring Clinical Social Work Intern [facility]

## 2024-02-03 NOTE — Progress Notes (Signed)
 I reviewed patient visit with social work Tax inspector. I concur with the treatment plan as documented in the SW intern's note.   Analis Distler E Siaosi Alter, LCSW Clinical Child psychotherapist

## 2024-02-10 ENCOUNTER — Inpatient Hospital Stay: Attending: Hematology and Oncology

## 2024-02-10 NOTE — Progress Notes (Signed)
 [facility] CSW Counseling Note  Patient was referred by medical provider. Treatment type: Individual  Presenting Concerns: Patient and/or family reports the following symptoms/concerns: anxiety, depression, and overwhelm. Duration of problem: several months; Severity of problem: mild   Orientation:oriented to person, place, and time/date.   Affect: Congruent Risk of harm to self or others: No plan to harm self or others  Patient and/or Family's Strengths/Protective Factors: Social connections, Social and Emotional competence, Concrete supports in place (healthy food, safe environments, etc.), and Physical Health (exercise, healthy diet, medication compliance, etc.)Ability for insight  Average or above average intelligence  Education administrator  Motivation for treatment/growth  Supportive family/friends      Goals Addressed: Patient will:  Reduce symptoms of: anxiety and depression Increase knowledge and/or ability of: coping skills, stress reduction, and processing grief.  Increase healthy adjustment to current life circumstances and Begin healthy grieving over loss   Progress towards Goals: Initial   Interventions: Interventions utilized:  Initial Intake  GAD 7 PHQ 9     02/10/2024    4:28 PM 01/25/2024   12:00 PM 04/14/2022    8:05 AM  PHQ9 SCORE ONLY  PHQ-9 Total Score 7 9 12       Data saved with a previous flowsheet row definition       02/10/2024    4:28 PM  GAD 7 : Generalized Anxiety Score  Nervous, Anxious, on Edge 3  Control/stop worrying 2  Worry too much - different things 2  Trouble relaxing 1  Restless 0  Easily annoyed or irritable 1  Afraid - awful might happen 0  Total GAD 7 Score 9  Anxiety Difficulty Somewhat difficult       Assessment: Patient currently experiencing stress related to being sole caregiver for her sister who has high level of needs, combined with the stress she says she puts on herself to be a good wife, mother,  and grandmother. She reports challenges balancing these roles, and caring for herself comes after everyone else. Re her sister's dementia pt expressed complex grief due to sister still living but no longer being present in the same way. CSW Intern offered validation for the often unseen work of caregiving, and the loss of who her sister once was. Pt shared fond memories of she and her sister line dancing and enjoying gospel music.  Another complex source of grief pt reports is the estrangement from her older son, and sense of helplessness for him to reconcile with the family. Intern and pt will continue to explore coping strategies for accepting her son's choices that she can't control and finding ways she can care for herself within this difficult situation. Pt and Intern also discussed coping skills for: managing feelings about physical changes due to tx such as hair loss; prioritizing self in survivorship; and fears that have come up in survivorship that pt will address with oncologist. Pt reports protective factors including: finding meaning in her work and having good work/ life balance; enjoyment of husband and family; taking a trip next week for wedding anniversary.      Plan: Follow up with CSW: 02/24/24 Behavioral recommendations: Speak to oncologist re health concerns; allow feelings of grief to come and go; try to prioritize own needs; positive affirmations re hair loss not changing who she is at the core. Referral(s): None at this time       Thersia KATHEE Daring Clinical Social Work Intern [facility]

## 2024-02-11 NOTE — Progress Notes (Signed)
 I reviewed patient visit with social work Tax inspector. I concur with the treatment plan as documented in the SW intern's note.   Analis Distler E Siaosi Alter, LCSW Clinical Child psychotherapist

## 2024-02-24 ENCOUNTER — Inpatient Hospital Stay

## 2024-02-26 ENCOUNTER — Inpatient Hospital Stay

## 2024-02-26 NOTE — Progress Notes (Signed)
 [facility] CSW Counseling Note  Patient was referred by medical provider. Treatment type: Individual  Presenting Concerns: Patient and/or family reports the following symptoms/concerns: anxiety, depression, and overwhelm. Duration of problem: several months; Severity of problem: mild   Orientation:oriented to person, place, and time/date.   Affect: Congruent Risk of harm to self or others: No plan to harm self or others  Patient and/or Family's Strengths/Protective Factors: Social connections, Social and Emotional competence, Concrete supports in place (healthy food, safe environments, etc.), and Physical Health (exercise, healthy diet, medication compliance, etc.)Ability for insight  Average or above average intelligence  Education administrator  Motivation for treatment/growth  Supportive family/friends      Goals Addressed: Patient will:  Reduce symptoms of: anxiety and depression Increase knowledge and/or ability of: coping skills, stress reduction, and processing grief.  Increase healthy adjustment to current life circumstances and Begin healthy grieving over loss   Progress towards Goals: Progressing    Interventions: Interventions utilized: Solution-Focused Strategies and ACT (Acceptance and Commitment Therapy)      02/10/2024    4:28 PM 01/25/2024   12:00 PM 04/14/2022    8:05 AM  PHQ9 SCORE ONLY  PHQ-9 Total Score 7 9 12       Data saved with a previous flowsheet row definition       02/10/2024    4:28 PM  GAD 7 : Generalized Anxiety Score  Nervous, Anxious, on Edge 3  Control/stop worrying 2  Worry too much - different things 2  Trouble relaxing 1  Restless 0  Easily annoyed or irritable 1  Afraid - awful might happen 0  Total GAD 7 Score 9  Anxiety Difficulty Somewhat difficult       Assessment: Patient shared update that her sister is moving forward with relocating to local senior living community- pt is hopeful that this will be a positive  move all around.  Pt shared more in depth about her older son's estrangement from the family and how painful this is for her. Intern offered empathetic listening and affirmed the importance of/ assisted in processing grief re the relationship she used to have with her son. Utilized ACT to shift focus away from fixing, which depends on the actions of others and is outside of pt's control, towards how she chooses to remain in contact with son in the ways that work for her and that son will permit. As of now, pt wishes to focus on keeping the door open for son to reconcile with family if he chooses, without trying to control the outcome. Intern acknowledged the difficulty of this balance and encouraged pt to offer herself patience and grace.      Plan: Follow up with CSW: 03/18/24 Behavioral recommendations: Continue engaging strategies for managing stress such as walking outside and deep breathing; Reach out to estranged son with specific question identified in session: How will an apology or lack of impact what it means to move forward? ; allow feelings of grief to come and go without trying to fix it; accept family member's choices while keeping own actions in alignment with values. Referral(s): None at this time       Madison Bryant Clinical Social Work Intern [facility]

## 2024-03-02 NOTE — Progress Notes (Signed)
 I reviewed patient visit with social work Tax inspector. I concur with the treatment plan as documented in the SW intern's note.   Analis Distler E Siaosi Alter, LCSW Clinical Child psychotherapist

## 2024-03-06 ENCOUNTER — Other Ambulatory Visit: Payer: Self-pay | Admitting: Family Medicine

## 2024-03-06 DIAGNOSIS — R7303 Prediabetes: Secondary | ICD-10-CM

## 2024-03-06 DIAGNOSIS — E782 Mixed hyperlipidemia: Secondary | ICD-10-CM

## 2024-03-18 ENCOUNTER — Inpatient Hospital Stay

## 2024-03-25 ENCOUNTER — Inpatient Hospital Stay: Attending: Hematology and Oncology

## 2024-03-25 NOTE — Progress Notes (Signed)
 [facility] CSW Counseling Note  Patient was referred by medical provider. Treatment type: Individual  Presenting Concerns: Patient and/or family reports the following symptoms/concerns: anxiety, depression, and overwhelm. Duration of problem: several months; Severity of problem: mild   Orientation:oriented to person, place, and time/date.   Affect: Congruent Risk of harm to self or others: No plan to harm self or others  Patient and/or Family's Strengths/Protective Factors: Social connections, Social and Emotional competence, Concrete supports in place (healthy food, safe environments, etc.), and Physical Health (exercise, healthy diet, medication compliance, etc.)Ability for insight  Average or above average intelligence  Education Administrator  Motivation for treatment/growth  Supportive family/friends      Goals Addressed: Patient will:  Reduce symptoms of: anxiety and depression Increase knowledge and/or ability of: coping skills, stress reduction, and processing grief.  Increase healthy adjustment to current life circumstances and Begin healthy grieving over loss   Progress towards Goals: Progressing    Interventions: Interventions utilized: Supportive counseling and ACT (Acceptance and Commitment Therapy)      02/10/2024    4:28 PM 01/25/2024   12:00 PM 04/14/2022    8:05 AM  PHQ9 SCORE ONLY  PHQ-9 Total Score 7 9 12       Data saved with a previous flowsheet row definition       02/10/2024    4:28 PM  GAD 7 : Generalized Anxiety Score  Nervous, Anxious, on Edge 3  Control/stop worrying 2  Worry too much - different things 2  Trouble relaxing 1  Restless 0  Easily annoyed or irritable 1  Afraid - awful might happen 0  Total GAD 7 Score 9  Anxiety Difficulty Somewhat difficult       Assessment: Patient reports that caregiving responsibilities for her sister have increased, as sister had surgery and moved into assisted living since our last  session. Patient describes feeling physically exhausted and mentally frazzled, and acknowledges not having time to process the grief that I've lost my sister, referring to sister's cognitive decline. Pt also shared that she lost a close family friend who she describes as being like a second mother, and continues to face difficult family dynamics as well as guilt that she can't prioritize husband/ children/ grandchildren while sister's needs are so great.   Intern validated the enormous work of being the primary caregiver for a loved one with dementia and the toll that it can take. Patient and Intern reframed guilt for being less available as an opportunity to lean on other family members for support. Intern reminded pt that this intense period of caregiving while her sister is in transition to a new living community is temporary, and encouraged her to be compassionate with herself in the meantime. Patient is looking forward to upcoming travels where she will enjoy much needed time away to reconnect with her husband and with herself.     Plan: Follow up with CSW: 04/22/24 Behavioral recommendations: Continue use of current coping strategies while traveling next week- spend quality time with spouse as well as making time for solitude; daily walks outside; prioritize rest and eating well.  Referral(s): None at this time       Thersia KATHEE Daring Clinical Social Work Intern [facility]

## 2024-03-28 NOTE — Progress Notes (Signed)
 I reviewed patient visit by social work Tax inspector. I concur with the treatment plan as documented in the SW intern's note.   Nazareth Norenberg E Lylianna Fraiser, LCSW Clinical Child psychotherapist

## 2024-03-31 ENCOUNTER — Ambulatory Visit (INDEPENDENT_AMBULATORY_CARE_PROVIDER_SITE_OTHER)

## 2024-03-31 VITALS — Ht 66.0 in | Wt 170.0 lb

## 2024-03-31 DIAGNOSIS — Z0000 Encounter for general adult medical examination without abnormal findings: Secondary | ICD-10-CM

## 2024-03-31 NOTE — Patient Instructions (Signed)
 Madison Bryant,  Thank you for taking the time for your Medicare Wellness Visit. I appreciate your continued commitment to your health goals. Please review the care plan we discussed, and feel free to reach out if I can assist you further.  Please note that Annual Wellness Visits do not include a physical exam. Some assessments may be limited, especially if the visit was conducted virtually. If needed, we may recommend an in-person follow-up with your provider.  Ongoing Care Seeing your primary care provider every 3 to 6 months helps us  monitor your health and provide consistent, personalized care.   Referrals If a referral was made during today's visit and you haven't received any updates within two weeks, please contact the referred provider directly to check on the status.  Recommended Screenings:  Health Maintenance  Topic Date Due   COVID-19 Vaccine (1) Never done   DTaP/Tdap/Td vaccine (1 - Tdap) Never done   Pneumococcal Vaccine for age over 72 (1 of 2 - PCV) Never done   Zoster (Shingles) Vaccine (2 of 2) 03/23/2019   Breast Cancer Screening  05/14/2024   Flu Shot  08/09/2024*   Osteoporosis screening with Bone Density Scan  03/31/2025   Medicare Annual Wellness Visit  03/31/2025   Colon Cancer Screening  09/11/2028   Hepatitis C Screening  Completed   Meningitis B Vaccine  Aged Out  *Topic was postponed. The date shown is not the original due date.       03/31/2024   12:14 PM  Advanced Directives  Does Patient Have a Medical Advance Directive? No  Would patient like information on creating a medical advance directive? Yes (MAU/Ambulatory/Procedural Areas - Information given)   Information on Advanced Care Planning can be found at [place]  Secretary of [facility] Advance Health Care Directives Advance Health Care Directives ([email or web address]    Vision: Annual vision screenings are recommended for early detection of glaucoma, cataracts, and diabetic retinopathy. These exams can  also reveal signs of chronic conditions such as diabetes and high blood pressure.  Dental: Annual dental screenings help detect early signs of oral cancer, gum disease, and other conditions linked to overall health, including heart disease and diabetes.  Please see the attached documents for additional preventive care recommendations.

## 2024-03-31 NOTE — Progress Notes (Signed)
 Chief Complaint  Patient presents with   Medicare Wellness     Subjective:   Madison Bryant is a 68 y.o. female who presents for a Medicare Annual Wellness Visit.  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)   Cellulitis    left foot   Family history of adverse reaction to anesthesia    sister cant use novacaine   GERD (gastroesophageal reflux disease)    OTC med prn   PONV (postoperative nausea and vomiting)    Wears glasses    Past Surgical History:  Procedure Laterality Date   ABDOMINAL HYSTERECTOMY     BREAST LUMPECTOMY WITH RADIOACTIVE SEED LOCALIZATION Right 06/20/2022   Procedure: RIGHT BREAST LUMPECTOMY WITH RADIOACTIVE SEED LOCALIZATION;  Surgeon: Belinda Cough, MD;  Location: [facility];  Service: General;  Laterality: Right;  LMA   COLONOSCOPY  2006   I & D EXTREMITY Left 10/09/2017   Procedure: IRRIGATION AND DEBRIDEMENT LEFT FOOT;  Surgeon: Jerri Kay HERO, MD;  Location: [facility] OR;  Service: Orthopedics;  Laterality: Left;   ORIF FIBULA FRACTURE Left 08/21/2016   Procedure: OPEN REDUCTION INTERNAL FIXATION (ORIF) DISTAL FIBULA FRACTURE;  Surgeon: Toribio JULIANNA Chancy, MD;  Location: [facility];  Service: Orthopedics;  Laterality: Left;   RE-EXCISION OF BREAST CANCER,SUPERIOR MARGINS Right 07/01/2022   Procedure: RE-EXCISION OF SUPERIOR AND LATERAL MARGINS RIGHT BREAST;  Surgeon: Belinda Cough, MD;  Location: [facility];  Service: General;  Laterality: Right;   ROTATOR CUFF REPAIR Right    TONSILLECTOMY     TUBAL LIGATION     Family History  Problem Relation Age of Onset   Miscarriages / Stillbirths Mother    Colon cancer Paternal Aunt 9 - 106   Social History   Occupational History   Not on file   Tobacco Use   Smoking status: Never   Smokeless tobacco: Never  Vaping Use   Vaping status: Never Used  Substance and Sexual Activity   Alcohol use: No   Drug use: No   Sexual activity: Yes   Tobacco Counseling Counseling given: Not Answered  SDOH Screenings   Food Insecurity: No Food Insecurity (03/31/2024)  Housing: Low Risk  (03/31/2024)  Transportation Needs: No Transportation Needs (03/31/2024)  Utilities: Not At Risk (03/31/2024)  Alcohol Screen: Low Risk  (06/04/2022)  Depression (PHQ2-9): Medium Risk (03/31/2024)  Financial Resource Strain: Low Risk  (06/04/2022)  Physical Activity: Inactive (03/31/2024)  Social Connections: Moderately Integrated (03/31/2024)  Stress: Stress Concern Present (03/31/2024)  Tobacco Use: Low Risk  (03/31/2024)  Health Literacy: Adequate Health Literacy (03/31/2024)   See flowsheets for full screening details  Depression Screen PHQ 2 & 9 Depression Scale- Over the past 2 weeks, how often have you been bothered by any of the following problems? Little interest or pleasure in doing things: 2 Feeling down, depressed, or hopeless (PHQ Adolescent also includes...irritable): 1 PHQ-2 Total Score: 3 Trouble falling or staying asleep, or sleeping too much: 1 Feeling tired or having little energy: 2 Poor appetite or overeating (PHQ Adolescent also includes...weight loss): 0 Feeling bad about yourself - or that you are a failure or have let yourself or your family down: 0 Trouble concentrating on  things, such as reading the newspaper or watching television [facility] Adolescent also includes...like school work): 1 Moving or speaking so slowly that other people could have noticed. Or the opposite - being so fidgety or restless that you have been moving around a lot more than usual: 0 Thoughts that you would be better off dead, or of hurting yourself in some way: 0 PHQ-9 Total Score: 7 If you checked off any problems, how difficult have these problems made  it for you to do your work, take care of things at home, or get along with other people?: Somewhat difficult  Depression Treatment Depression Interventions/Treatment : Counseling     Goals Addressed             This Visit's Progress    Remain active and independent   On track      Visit info / Clinical Intake: Medicare Wellness Visit Type:: Initial Annual Wellness Visit Persons participating in visit:: patient Medicare Wellness Visit Mode:: Telephone If telephone:: video declined Because this visit was a virtual/telehealth visit:: vitals recorded from last visit If Telephone or Video please confirm:: I connected with the patient using audio enabled telemedicine application and verified that I am speaking with the correct person using two identifiers; The patient expressed understanding and agreed to proceed; I discussed the limitations of evaluation and management by telemedicine Patient Location:: home Provider Location:: [place] Information given by:: patient Interpreter Needed?: No Pre-visit prep was completed: yes AWV questionnaire completed by patient prior to visit?: no Living arrangements:: lives with spouse/significant other Patient's Overall Health Status Rating: good Typical amount of pain: some Does pain affect daily life?: no Are you currently prescribed opioids?: no  Dietary Habits and Nutritional Risks How many meals a day?: 3 Eats fruit and vegetables daily?: yes Most meals are obtained by: preparing own meals Diabetic:: no  Functional Status Activities of Daily Living (to include ambulation/medication): Independent Ambulation: Independent Medication Administration: Independent Home Management: Independent Manage your own finances?: yes Primary transportation is: driving Concerns about vision?: no *vision screening is required for WTM* Concerns about hearing?: no  Fall Screening Falls in the past year?: 0 Number of falls in past year: 0 Was there  an injury with Fall?: 0 Fall Risk Category Calculator: 0 Patient Fall Risk Level: Low Fall Risk  Fall Risk Patient at Risk for Falls Due to: No Fall Risks Fall risk Follow up: Falls evaluation completed; Education provided; Falls prevention discussed  Home and Transportation Safety: All rugs have non-skid backing?: yes All stairs or steps have railings?: yes Grab bars in the bathtub or shower?: yes Have non-skid surface in bathtub or shower?: yes Good home lighting?: yes Regular seat belt use?: yes Hospital stays in the last year:: no  Cognitive Assessment Difficulty concentrating, remembering, or making decisions? : no Will 6CIT or Mini Cog be Completed: no 6CIT or Mini Cog Declined: patient alert, oriented, able to answer questions appropriately and recall recent events  Advance Directives (For Healthcare) Does Patient Have a Medical Advance Directive?: No Does patient want to make changes to medical advance directive?: No - Patient declined Type of Advance Directive: Healthcare Power of [place]; Living will Copy of Healthcare Power of Attorney in Chart?: Yes - validated most recent copy scanned in chart (See row information) Copy of Living Will in Chart?: Yes - validated most recent copy scanned in chart (See row information) Would patient like information on creating a medical advance directive?: Yes (MAU/Ambulatory/Procedural Areas - Information given)  Reviewed/Updated  Reviewed/Updated:  Reviewed All (Medical, Surgical, Family, Medications, Allergies, Care Teams, Patient Goals)        Objective:    Today's Vitals   03/31/24 1213  Weight: 170 lb (77.1 kg)  Height: 5' 6 (1.676 m)   Body mass index is 27.44 kg/m.  Current Medications (verified) Outpatient Encounter Medications as of 03/31/2024  Medication Sig   acetaminophen  (TYLENOL ) 325 MG tablet Take 650 mg by mouth every 6 (six) hours as needed.   Coenzyme Q10 (COQ10 PO) Take 1 tablet by mouth daily.    Cyanocobalamin  (VITAMIN B 12 PO) Take by mouth.   ibuprofen  (ADVIL ) 800 MG tablet Take 1 tablet (800 mg total) by mouth every 8 (eight) hours as needed.   letrozole  (FEMARA ) 2.5 MG tablet Take 1 tablet (2.5 mg total) by mouth daily.   Misc Natural Products (NEURIVA PO) Take by mouth.   Multiple Vitamins-Minerals (MULTIVITAMIN ADULTS 50+) TABS Take by mouth.   Safety Seal Miscellaneous MISC Minoxidil 8% - apply to affected areas of the scalp every morning.   triamcinolone  cream (KENALOG ) 0.1 % Apply 1 Application topically 2 (two) times daily.   VITAMIN D, CHOLECALCIFEROL, PO Take by mouth.   XIIDRA 5 % SOLN    HYDROcodone  bit-homatropine (HYCODAN) 5-1.5 MG/5ML syrup Take 5 mLs by mouth every 8 (eight) hours as needed for cough. (Patient not taking: Reported on 03/31/2024)   rosuvastatin  (CRESTOR ) 10 MG tablet TAKE 1 TABLET BY MOUTH EVERY DAY (Patient not taking: Reported on 03/31/2024)   No facility-administered encounter medications on file as of 03/31/2024.   Hearing/Vision screen Hearing Screening - Comments:: Patient is able to hear conversational tones without difficulty. No issues reported.   Vision Screening - Comments:: Wears rx glasses - up to date with routine eye exams with [facility]  Immunizations and Health Maintenance Health Maintenance  Topic Date Due   COVID-19 Vaccine (1) Never done   DTaP/Tdap/Td (1 - Tdap) Never done   Pneumococcal Vaccine: 50+ Years (1 of 2 - PCV) Never done   Zoster Vaccines- Shingrix  (2 of 2) 03/23/2019   Mammogram  05/14/2024   Influenza Vaccine  08/09/2024 (Originally 12/11/2023)   Bone Density Scan  03/31/2025   Medicare Annual Wellness (AWV)  03/31/2025   Colonoscopy  09/11/2028   Hepatitis C Screening  Completed   Meningococcal B Vaccine  Aged Out        Assessment/Plan:  This is a routine wellness examination for [place].  Patient Care Team: [name] DASEN, MD as PCP - General (Family Medicine) Loretha Ash, MD as  Consulting Physician (Hematology and Oncology) Belinda Cough, MD as Consulting Physician (General Surgery) Dewey Rush, MD as Consulting Physician (Radiation Oncology) Alm Delon SAILOR, DO as Consulting Physician (Dermatology) Loretha Ash, MD as Consulting Physician (Hematology and Oncology) Pa, [facility])  I have personally reviewed and noted the following in the patient's chart:   Medical and social history Use of alcohol, tobacco or illicit drugs  Current medications and supplements including opioid prescriptions. Functional ability and status Nutritional status Physical activity Advanced directives List of other physicians Hospitalizations, surgeries, and ER visits in previous 12 months Vitals Screenings to include cognitive, depression, and falls Referrals and appointments  No orders of the defined types were placed in this encounter.  In addition, I have reviewed and discussed with patient certain preventive protocols, quality metrics, and best practice recommendations. A written personalized care plan for preventive services as well as general preventive health recommendations were provided to  patient.   Lavelle Charmaine Browner, LPN   [id number]/79/[id number]   Return in 1 year (on 03/31/2025).  After Visit Summary: (MyChart) Due to this being a telephonic visit, the after visit summary with patients personalized plan was offered to patient via MyChart   Nurse Notes: No concerns at this time

## 2024-04-18 ENCOUNTER — Ambulatory Visit: Admitting: Family Medicine

## 2024-04-18 ENCOUNTER — Encounter: Payer: Self-pay | Admitting: Family Medicine

## 2024-04-18 VITALS — BP 110/64 | HR 73 | Temp 98.0°F | Ht 66.0 in | Wt 172.6 lb

## 2024-04-18 DIAGNOSIS — E782 Mixed hyperlipidemia: Secondary | ICD-10-CM | POA: Diagnosis not present

## 2024-04-18 DIAGNOSIS — Z23 Encounter for immunization: Secondary | ICD-10-CM

## 2024-04-18 DIAGNOSIS — R7303 Prediabetes: Secondary | ICD-10-CM | POA: Diagnosis not present

## 2024-04-18 NOTE — Progress Notes (Signed)
 Subjective:    Patient ID: Madison Bryant, female    DOB: 1955/08/19, 68 y.o.   MRN: [id number]  HPI  12/08/23 Patient has a history of borderline diabetes.  Her hemoglobin A1c was 6.4 in February.  She has been working on diet exercise and weight loss however she has plateaued with weight loss and she is unable to achieve any significant weight loss despite her efforts.  She is restricting her carbohydrates and trying to eat a low-fat diet.  She continues to report fatigue.  She is also having hair loss in an androgenic pattern.  At that time, my plan was: Given her fatigue I will check a TSH along with a B12 level.  I would really recommend the iron panel.  If significantly elevated I would recommend adding Ozempic to facilitate weight loss.  Help with insulin resistance, and manage her blood sugar  04/18/24 Wt Readings from Last 3 Encounters:  04/18/24 172 lb 9.6 oz (78.3 kg)  03/31/24 170 lb (77.1 kg)  01/25/24 176 lb 11.2 oz (80.2 kg)   Patient is really trying to change her diet and her lifestyle.  She is exercising on a daily basis.  She is using a stationary bike.  She is also restricting her carbohydrates.  She is trying to drink unsweetened tea.  She is avoiding sodas.  She is watching her intake of bread potatoes and Post[place] and trying to eliminate that from her diet.  She states that on her scale she has lost from 180 earlier this year to 172 now.  She does have a small 1 to 2 mm wartlike papule on the floor of her right nostril.  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)   Cellulitis    left foot   Family history of adverse reaction to anesthesia    sister cant use novacaine   GERD (gastroesophageal reflux disease)    OTC med prn   PONV (postoperative  nausea and vomiting)    Wears glasses    Past Surgical History:  Procedure Laterality Date   ABDOMINAL HYSTERECTOMY     BREAST LUMPECTOMY WITH RADIOACTIVE SEED LOCALIZATION Right 06/20/2022   Procedure: RIGHT BREAST LUMPECTOMY WITH RADIOACTIVE SEED LOCALIZATION;  Surgeon: Belinda Cough, MD;  Location: [facility];  Service: General;  Laterality: Right;  LMA   COLONOSCOPY  2006   I & D EXTREMITY Left 10/09/2017   Procedure: IRRIGATION AND DEBRIDEMENT LEFT FOOT;  Surgeon: Jerri Kay HERO, MD;  Location: [facility];  Service: Orthopedics;  Laterality: Left;   ORIF FIBULA FRACTURE Left 08/21/2016   Procedure: OPEN REDUCTION INTERNAL FIXATION (ORIF) DISTAL FIBULA FRACTURE;  Surgeon: Toribio JULIANNA Chancy, MD;  Location: [facility];  Service: Orthopedics;  Laterality: Left;   RE-EXCISION OF BREAST CANCER,SUPERIOR MARGINS Right 07/01/2022   Procedure: RE-EXCISION OF SUPERIOR AND LATERAL MARGINS RIGHT BREAST;  Surgeon: Belinda Cough, MD;  Location: [facility];  Service: General;  Laterality: Right;   ROTATOR CUFF REPAIR Right    TONSILLECTOMY     TUBAL LIGATION     Current Outpatient Medications on File Prior to Visit  Medication Sig Dispense Refill   acetaminophen  (  TYLENOL ) 325 MG tablet Take 650 mg by mouth every 6 (six) hours as needed.     Coenzyme Q10 (COQ10 PO) Take 1 tablet by mouth daily.     Cyanocobalamin  (VITAMIN B 12 PO) Take by mouth.     HYDROcodone  bit-homatropine (HYCODAN) 5-1.5 MG/5ML syrup Take 5 mLs by mouth every 8 (eight) hours as needed for cough. (Patient not taking: Reported on 03/31/2024) 120 mL 0   ibuprofen  (ADVIL ) 800 MG tablet Take 1 tablet (800 mg total) by mouth every 8 (eight) hours as needed. 30 tablet 2   letrozole  (FEMARA ) 2.5 MG tablet Take 1 tablet (2.5 mg total) by mouth daily. 90 tablet 3   Misc Natural Products (NEURIVA PO) Take by mouth.     Multiple Vitamins-Minerals (MULTIVITAMIN ADULTS 50+) TABS Take by mouth.      rosuvastatin  (CRESTOR ) 10 MG tablet TAKE 1 TABLET BY MOUTH EVERY DAY (Patient not taking: Reported on 03/31/2024) 90 tablet 1   Safety Seal Miscellaneous MISC Minoxidil 8% - apply to affected areas of the scalp every morning. 1 Application 0   triamcinolone  cream (KENALOG ) 0.1 % Apply 1 Application topically 2 (two) times daily. 30 g 0   VITAMIN D, CHOLECALCIFEROL, PO Take by mouth.     XIIDRA 5 % SOLN      No current facility-administered medications on file prior to visit.   Allergies  Allergen Reactions   Azithromycin     Other Reaction(s): stomach upset   Erythromycin Nausea And Vomiting and Other (See Comments)    GI UPSET   Social History   Socioeconomic History   Marital status: Married    Spouse name: Not on file   Number of children: Not on file   Years of education: Not on file   Highest education level: Not on file  Occupational History   Not on file  Tobacco Use   Smoking status: Never   Smokeless tobacco: Never  Vaping Use   Vaping status: Never Used  Substance and Sexual Activity   Alcohol use: No   Drug use: No   Sexual activity: Yes  Other Topics Concern   Not on file  Social History Narrative   Not on file   Social Drivers of Health   Financial Resource Strain: Low Risk  (06/04/2022)   Overall Financial Resource Strain (CARDIA)    Difficulty of Paying Living Expenses: Not hard at all  Food Insecurity: No Food Insecurity (03/31/2024)   Hunger Vital Sign    Worried About Running Out of Food in the Last Year: Never true    Ran Out of Food in the Last Year: Never true  Transportation Needs: No Transportation Needs (03/31/2024)   PRAPARE - Administrator, Civil Service (Medical): No    Lack of Transportation (Non-Medical): No  Physical Activity: Inactive (03/31/2024)   Exercise Vital Sign    Days of Exercise per Week: 0 days    Minutes of Exercise per Session: 0 min  Stress: Stress Concern Present (03/31/2024)   [employer] of  Occupational Health - Occupational Stress Questionnaire    Feeling of Stress: To some extent  Social Connections: Moderately Integrated (03/31/2024)   Social Connection and Isolation Panel    Frequency of Communication with Friends and Family: More than three times a week    Frequency of Social Gatherings with Friends and Family: Three times a week    Attends Religious Services: More than 4 times per year    Active Member  of Clubs or Organizations: No    Attends Banker Meetings: Never    Marital Status: Married  Catering Manager Violence: Not At Risk (03/31/2024)   Humiliation, Afraid, Rape, and Kick questionnaire    Fear of Current or Ex-Partner: No    Emotionally Abused: No    Physically Abused: No    Sexually Abused: No      Review of Systems  All other systems reviewed and are negative.      Objective:   Physical Exam Constitutional:      General: She is not in acute distress.    Appearance: Normal appearance. She is not ill-appearing, toxic-appearing or diaphoretic.  HENT:     Head: Normocephalic and atraumatic.     Right Ear: Tympanic membrane and ear canal normal.     Left Ear: Tympanic membrane and ear canal normal.     Nose: Nose normal. No congestion or rhinorrhea.     Mouth/Throat:     Mouth: Mucous membranes are moist.     Pharynx: Oropharynx is clear. No oropharyngeal exudate or posterior oropharyngeal erythema.  Eyes:     Extraocular Movements: Extraocular movements intact.     Conjunctiva/sclera: Conjunctivae normal.     Pupils: Pupils are equal, round, and reactive to light.  Neck:     Vascular: No carotid bruit.  Cardiovascular:     Rate and Rhythm: Normal rate and regular rhythm.     Pulses: Normal pulses.     Heart sounds: Normal heart sounds. No murmur heard.    No friction rub. No gallop.  Pulmonary:     Effort: Pulmonary effort is normal. No respiratory distress.     Breath sounds: Normal breath sounds. No stridor. No wheezing.   Abdominal:     General: Abdomen is flat. Bowel sounds are normal. There is no distension.     Palpations: Abdomen is soft.     Tenderness: There is no abdominal tenderness. There is no guarding or rebound.  Musculoskeletal:        General: No swelling, tenderness, deformity or signs of injury.     Cervical back: Normal range of motion and neck supple. No rigidity or tenderness.     Right lower leg: No edema.     Left lower leg: No edema.  Lymphadenopathy:     Cervical: No cervical adenopathy.  Skin:    Findings: No erythema or rash.  Neurological:     General: No focal deficit present.     Mental Status: She is alert and oriented to person, place, and time. Mental status is at baseline.     Motor: No weakness.     Gait: Gait normal.     Deep Tendon Reflexes: Reflexes normal.  Psychiatric:        Mood and Affect: Mood normal.        Behavior: Behavior normal.        Thought Content: Thought content normal.        Judgment: Judgment normal.           Assessment & Plan:  Prediabetes - Plan: CBC with Differential/Platelet, Comprehensive metabolic panel with GFR, Lipid panel, Hemoglobin A1c  Elevated cholesterol with elevated triglycerides - Plan: CBC with Differential/Platelet, Comprehensive metabolic panel with GFR, Lipid panel, Hemoglobin A1c  Need for vaccination - Plan: Pneumococcal conjugate vaccine 20-valent (Prevnar 20)  Need for shingles vaccine Blood pressure today is outstanding.  Patient received her pneumonia vaccine.  Check CBC CMP fasting lipid panel and  A1c.  I would like to see her LDL cholesterol less than 899.  I would like to see her hemoglobin A1c under 6.5.  If greater than 6.5, I would recommend trying Ozempic

## 2024-04-19 ENCOUNTER — Other Ambulatory Visit: Payer: Self-pay

## 2024-04-19 ENCOUNTER — Ambulatory Visit: Payer: Self-pay | Admitting: Family Medicine

## 2024-04-19 DIAGNOSIS — R7303 Prediabetes: Secondary | ICD-10-CM

## 2024-04-19 DIAGNOSIS — E782 Mixed hyperlipidemia: Secondary | ICD-10-CM

## 2024-04-19 LAB — COMPREHENSIVE METABOLIC PANEL WITH GFR
AG Ratio: 1.9 (calc) (ref 1.0–2.5)
ALT: 17 U/L (ref 6–29)
AST: 19 U/L (ref 10–35)
Albumin: 4.4 g/dL (ref 3.6–5.1)
Alkaline phosphatase (APISO): 73 U/L (ref 37–153)
BUN: 12 mg/dL (ref 7–25)
CO2: 28 mmol/L (ref 20–32)
Calcium: 9.5 mg/dL (ref 8.6–10.4)
Chloride: 105 mmol/L (ref 98–110)
Creat: 0.77 mg/dL (ref 0.50–1.05)
Globulin: 2.3 g/dL (ref 1.9–3.7)
Glucose, Bld: 124 mg/dL — ABNORMAL HIGH (ref 65–99)
Potassium: 4.4 mmol/L (ref 3.5–5.3)
Sodium: 141 mmol/L (ref 135–146)
Total Bilirubin: 0.7 mg/dL (ref 0.2–1.2)
Total Protein: 6.7 g/dL (ref 6.1–8.1)
eGFR: 85 mL/min/1.73m2 (ref >=60)

## 2024-04-19 LAB — LIPID PANEL
Cholesterol: 228 mg/dL — ABNORMAL HIGH (ref <200)
HDL: 59 mg/dL (ref >=50)
LDL Cholesterol (Calc): 138 mg/dL — ABNORMAL HIGH
Non-HDL Cholesterol (Calc): 169 mg/dL — ABNORMAL HIGH (ref <130)
Total CHOL/HDL Ratio: 3.9 (calc) (ref <5.0)
Triglycerides: 168 mg/dL — ABNORMAL HIGH (ref <150)

## 2024-04-19 LAB — CBC WITH DIFFERENTIAL/PLATELET
Absolute Lymphocytes: 1176 {cells}/uL (ref 850–3900)
Absolute Monocytes: 416 {cells}/uL (ref 200–950)
Basophils Absolute: 42 {cells}/uL (ref 0–200)
Basophils Relative: 1 %
Eosinophils Absolute: 214 {cells}/uL (ref 15–500)
Eosinophils Relative: 5.1 %
HCT: 39.3 % (ref 35.9–46.0)
Hemoglobin: 12.8 g/dL (ref 11.7–15.5)
MCH: 29.4 pg (ref 27.0–33.0)
MCHC: 32.6 g/dL (ref 31.6–35.4)
MCV: 90.3 fL (ref 81.4–101.7)
MPV: 9.1 fL (ref 7.5–12.5)
Monocytes Relative: 9.9 %
Neutro Abs: 2352 {cells}/uL (ref 1500–7800)
Neutrophils Relative %: 56 %
Platelets: 276 Thousand/uL (ref 140–400)
RBC: 4.35 Million/uL (ref 3.80–5.10)
RDW: 12.2 % (ref 11.0–15.0)
Total Lymphocyte: 28 %
WBC: 4.2 Thousand/uL (ref 3.8–10.8)

## 2024-04-19 LAB — HEMOGLOBIN A1C
Hgb A1c MFr Bld: 6 % — ABNORMAL HIGH (ref <5.7)
Mean Plasma Glucose: 126 mg/dL
eAG (mmol/L): 7 mmol/L

## 2024-04-19 MED ORDER — ROSUVASTATIN CALCIUM 10 MG PO TABS: 10.0000 mg | ORAL_TABLET | Freq: Every day | ORAL | 1 refills | Status: AC

## 2024-04-22 ENCOUNTER — Inpatient Hospital Stay: Attending: Hematology and Oncology

## 2024-04-22 NOTE — Progress Notes (Signed)
 [facility] CSW Counseling Note  Patient was referred by medical provider. Treatment type: Individual  Presenting Concerns: Patient and/or family reports the following symptoms/concerns: anxiety, depression, and overwhelm. Duration of problem: several months; Severity of problem: mild   Orientation:oriented to person, place, and time/date.   Affect: Congruent Risk of harm to self or others: No plan to harm self or others  Patient and/or Family's Strengths/Protective Factors: Social connections, Social and Emotional competence, Concrete supports in place (healthy food, safe environments, etc.), and Physical Health (exercise, healthy diet, medication compliance, etc.)Ability for insight  Average or above average intelligence  Education Administrator  Motivation for treatment/growth  Supportive family/friends      Goals Addressed: Patient will:  Reduce symptoms of: anxiety and depression Increase knowledge and/or ability of: coping skills, stress reduction, and processing grief.  Increase healthy adjustment to current life circumstances and Begin healthy grieving over loss   Progress towards Goals: Progressing    Interventions: Interventions utilized: Supportive counseling and ACT (Acceptance and Commitment Therapy)      03/31/2024   12:17 PM 02/10/2024    4:28 PM 01/25/2024   12:00 PM  PHQ9 SCORE ONLY  PHQ-9 Total Score 7 7 9        02/10/2024    4:28 PM  GAD 7 : Generalized Anxiety Score  Nervous, Anxious, on Edge 3  Control/stop worrying 2  Worry too much - different things 2  Trouble relaxing 1  Restless 0  Easily annoyed or irritable 1  Afraid - awful might happen 0  Total GAD 7 Score 9  Anxiety Difficulty Somewhat difficult       Assessment: Patient reports that caregiving responsibilities are increasingly challenging, as sister's dementia is progressing. Intern assisted patient in processing complex grief for sister who is physically present but not  the same person she was, by sharing challenges as well as happy memories. Grief exacerbated by prior loss of patient's father due to Alzheimer's, and pt processed feelings of helplessness in both cases. Intern encouraged patient to release emotions when able, understanding that the grip of strong emotions can ease when given an outlet and assist in the development of acceptance.      Plan: Follow up with CSW: Jan 2026 Behavioral recommendations: Walking outdoors 3x/week for physical and mental/ emotional health; create time to acknowledge and release feelings of grief and loss when they arise; prioritize rest and eating well.  Referral(s): None at this time       Thersia KATHEE Daring Clinical Social Work Intern [facility]

## 2024-04-25 ENCOUNTER — Ambulatory Visit: Admitting: Dermatology

## 2024-04-25 NOTE — Progress Notes (Signed)
 I reviewed patient visit by social work Tax inspector. I concur with the treatment plan as documented in the SW intern's note.   Nazareth Norenberg E Lylianna Fraiser, LCSW Clinical Child psychotherapist

## 2024-05-16 LAB — HM MAMMOGRAPHY

## 2024-05-19 ENCOUNTER — Telehealth: Payer: Self-pay | Admitting: *Deleted

## 2024-05-19 NOTE — Telephone Encounter (Signed)
 Pt called and stated that she need pathology report mailed for insurance purposes. Reports have been put in outgoing mailbox to mail.

## 2024-06-03 ENCOUNTER — Inpatient Hospital Stay: Attending: Hematology and Oncology

## 2024-06-03 NOTE — Progress Notes (Signed)
 [facility] CSW Counseling Note  Patient was referred by medical provider. Treatment type: Individual  Presenting Concerns: Patient and/or family reports the following symptoms/concerns: anxiety, depression, and overwhelm. Duration of problem: several months; Severity of problem: mild   Orientation:oriented to person, place, and time/date.   Affect: Congruent Risk of harm to self or others: No plan to harm self or others  Patient and/or Family's Strengths/Protective Factors: Social connections, Social and Emotional competence, Concrete supports in place (healthy food, safe environments, etc.), and Physical Health (exercise, healthy diet, medication compliance, etc.)Ability for insight  Average or above average intelligence  Education Administrator  Motivation for treatment/growth  Supportive family/friends      Goals Addressed: Patient will:  Reduce symptoms of: anxiety and depression Increase knowledge and/or ability of: coping skills, stress reduction, and processing grief.  Increase healthy adjustment to current life circumstances and Begin healthy grieving over loss   Progress towards Goals: Progressing    Interventions: Interventions utilized: Supportive counseling and ACT (Acceptance and Commitment Therapy)      03/31/2024   12:17 PM 02/10/2024    4:28 PM 01/25/2024   12:00 PM  PHQ9 SCORE ONLY  PHQ-9 Total Score 7 7 9        02/10/2024    4:28 PM  GAD 7 : Generalized Anxiety Score  Nervous, Anxious, on Edge 3   Control/stop worrying 2   Worry too much - different things 2   Trouble relaxing 1   Restless 0   Easily annoyed or irritable 1   Afraid - awful might happen 0   Total GAD 7 Score 9  Anxiety Difficulty Somewhat difficult     Data saved with a previous flowsheet row definition       Assessment:  CSW Intern offered supportive listening as patient shared ongoing strain and challenges of being the sole caregiver for her sister who suffers from  dementia. Patient processed grief related to her father's death from Alzheimer's and her fears about inheriting the disease. Intern made space for patient to explore issues of faith related to what she is experiencing, and encouraged pt to continue asking for support from other family members as her caregiving responsibilities continue to increase.      Plan: Follow up with CSW: In two weeks Behavioral recommendations: Walks with husband for quality time + physical/ mental benefits; ask for/ accept offers of support; attend caregiver support group Referral(s): [employer]- Caregiver support group        Thersia KATHEE Daring Clinical Social Work Intern [employer]

## 2024-06-06 NOTE — Progress Notes (Signed)
 I reviewed patient visit by social work Tax inspector. I concur with the treatment plan as documented in the SW intern's note.   Nazareth Norenberg E Lylianna Fraiser, LCSW Clinical Child psychotherapist

## 2024-06-15 ENCOUNTER — Inpatient Hospital Stay: Attending: Hematology and Oncology

## 2024-06-15 NOTE — Progress Notes (Signed)
 [facility] CSW Counseling Note  Patient was referred by medical provider. Treatment type: Individual  Presenting Concerns: Patient and/or family reports the following symptoms/concerns: anxiety, depression, and overwhelm. Duration of problem: several months; Severity of problem: moderate   Orientation:oriented to person, place, and time/date.   Affect: Congruent Risk of harm to self or others: No plan to harm self or others  Patient and/or Family's Strengths/Protective Factors: Social connections, Social and Emotional competence, Concrete supports in place (healthy food, safe environments, etc.), and Physical Health (exercise, healthy diet, medication compliance, etc.)Ability for insight  Average or above average intelligence  Education Administrator  Motivation for treatment/growth  Supportive family/friends      Goals Addressed: Patient will:  Reduce symptoms of: anxiety and depression Increase knowledge and/or ability of: coping skills, stress reduction, and processing grief.  Increase healthy adjustment to current life circumstances and Begin healthy grieving over loss   Progress towards Goals: Progressing    Interventions: Interventions utilized: Mindfulness or Relaxation Training, Supportive counseling and ACT (Acceptance and Commitment Therapy)      06/15/2024    1:21 PM 03/31/2024   12:17 PM 02/10/2024    4:28 PM  PHQ9 SCORE ONLY  PHQ-9 Total Score 4 7 7        06/15/2024    1:20 PM 02/10/2024    4:28 PM  GAD 7 : Generalized Anxiety Score  Nervous, Anxious, on Edge 3 3   Control/stop worrying 2 2   Worry too much - different things 2 2   Trouble relaxing 2 1   Restless 0 0   Easily annoyed or irritable 3 1   Afraid - awful might happen 1 0   Total GAD 7 Score 13 9  Anxiety Difficulty Somewhat difficult Somewhat difficult     Data saved with a previous flowsheet row definition       Assessment:  Patient reports she is exploring moving her sister  to a new facility due to concerns where she currently lives. The possibility of additional transition heightens patient's sense of responsibility and stress, which are already high. Intern affirmed how hard patient is working to advocate and care for her sister and assisted patient in processing difficult emotions, encouraging her to accept her feelings rather than try to change them. Patient and Intern engaged in a guided meditation practice, bringing attention to the present moment, noticing thoughts/ feelings float by like clouds in the sky. Discussed benefits of mindfulness re fear and anxiety, which relate to future uncertainty. Since the weather prohibits patient getting outside for walks, she is committed to riding her stationary bike when she needs to move and clear her mind.    Plan: Follow up with CSW: In two weeks Behavioral recommendations: Guided meditations on Insight Timer app; attend caregiver support group Referral(s): [employer]- Caregiver support group        Thersia KATHEE Daring Clinical Social Work Intern [employer]

## 2024-06-15 NOTE — Progress Notes (Signed)
 I reviewed patient visit with social work Tax inspector. I concur with the treatment plan as documented in the SW intern's note.   Analis Distler E Siaosi Alter, LCSW Clinical Child psychotherapist

## 2024-06-29 ENCOUNTER — Inpatient Hospital Stay

## 2024-07-26 ENCOUNTER — Ambulatory Visit: Admitting: Hematology and Oncology
